# Patient Record
Sex: Female | Born: 1990 | Race: White | Hispanic: No | Marital: Married | Smoking: Never smoker
Health system: Southern US, Community
[De-identification: ages and names within clinical notes are randomized; demographics above are authoritative.]

## PROBLEM LIST (undated history)

## (undated) ENCOUNTER — Inpatient Hospital Stay: Payer: Self-pay

## (undated) DIAGNOSIS — R569 Unspecified convulsions: Secondary | ICD-10-CM

## (undated) DIAGNOSIS — N200 Calculus of kidney: Secondary | ICD-10-CM

## (undated) DIAGNOSIS — O26839 Pregnancy related renal disease, unspecified trimester: Secondary | ICD-10-CM

## (undated) DIAGNOSIS — Z87442 Personal history of urinary calculi: Secondary | ICD-10-CM

## (undated) DIAGNOSIS — Z3009 Encounter for other general counseling and advice on contraception: Secondary | ICD-10-CM

## (undated) HISTORY — DX: Encounter for other general counseling and advice on contraception: Z30.09

## (undated) HISTORY — DX: Personal history of urinary calculi: Z87.442

## (undated) HISTORY — PX: TONSILLECTOMY: SUR1361

---
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
## 2009-01-25 ENCOUNTER — Inpatient Hospital Stay: Payer: Self-pay | Admitting: Internal Medicine

## 2009-03-10 ENCOUNTER — Ambulatory Visit: Payer: Self-pay

## 2009-03-20 ENCOUNTER — Ambulatory Visit: Payer: Self-pay

## 2009-04-07 ENCOUNTER — Ambulatory Visit: Payer: Self-pay

## 2010-03-02 ENCOUNTER — Emergency Department (HOSPITAL_COMMUNITY)
Admission: EM | Admit: 2010-03-02 | Discharge: 2010-03-03 | Payer: Self-pay | Source: Home / Self Care | Admitting: Emergency Medicine

## 2010-03-03 LAB — CBC
HCT: 36.6 % (ref 36.0–46.0)
Hemoglobin: 12.5 g/dL (ref 12.0–15.0)
MCV: 83.4 fL (ref 78.0–100.0)
WBC: 11.6 10*3/uL — ABNORMAL HIGH (ref 4.0–10.5)

## 2010-03-03 LAB — URINE MICROSCOPIC-ADD ON

## 2010-03-03 LAB — URINALYSIS, ROUTINE W REFLEX MICROSCOPIC
Specific Gravity, Urine: 1.02 (ref 1.005–1.030)
Urine Glucose, Fasting: NEGATIVE mg/dL
pH: 6.5 (ref 5.0–8.0)

## 2010-03-03 LAB — BASIC METABOLIC PANEL
CO2: 23 mEq/L (ref 19–32)
Glucose, Bld: 92 mg/dL (ref 70–99)
Potassium: 4 mEq/L (ref 3.5–5.1)
Sodium: 140 mEq/L (ref 135–145)

## 2010-03-03 LAB — DIFFERENTIAL
Basophils Absolute: 0 10*3/uL (ref 0.0–0.1)
Lymphocytes Relative: 26 % (ref 12–46)
Lymphs Abs: 3.1 10*3/uL (ref 0.7–4.0)
Monocytes Absolute: 0.7 10*3/uL (ref 0.1–1.0)
Neutro Abs: 7.8 10*3/uL — ABNORMAL HIGH (ref 1.7–7.7)

## 2010-03-05 LAB — URINE CULTURE: Colony Count: >=100000

## 2010-12-27 ENCOUNTER — Other Ambulatory Visit: Payer: Self-pay | Admitting: Dermatology

## 2011-07-29 IMAGING — CT CT ABD-PELV W/O CM
3 of 4 series · 7 of 46 positions shown, 13 images · non-contrast
Comparison: None

CLINICAL DATA: Bilateral flank pain/back pain.

CT ABDOMEN AND PELVIS WITHOUT CONTRAST
TECHNIQUE: Multidetector CT imaging of the abdomen and pelvis was
performed following the standard protocol without intravenous
contrast.

[Series 3: lung 5.0 b60f · axial · 0.72mm/px · z∈[-46,-2]mm · 3 of 10 slices shown, 7 images]
[im 1/10  soft-tissue]
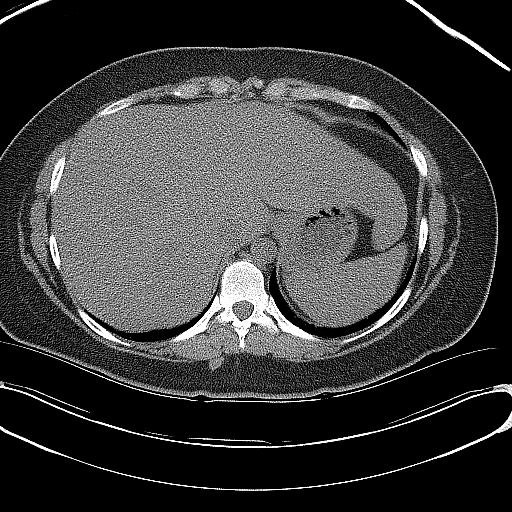
[im 1/10  lung]
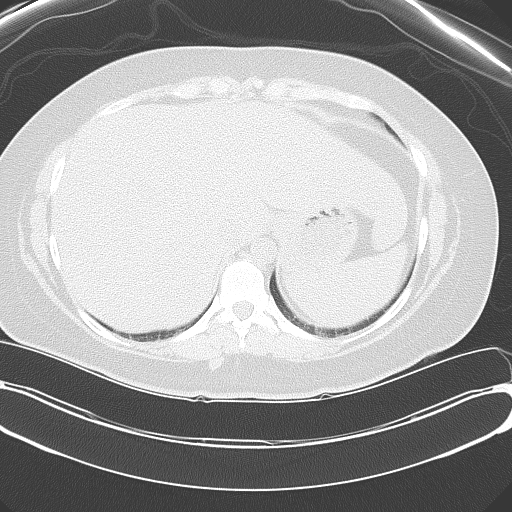
[im 1/10  bone]
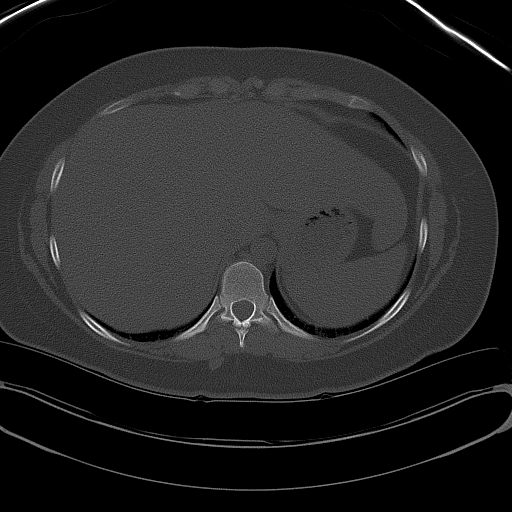
[im 5/10  soft-tissue]
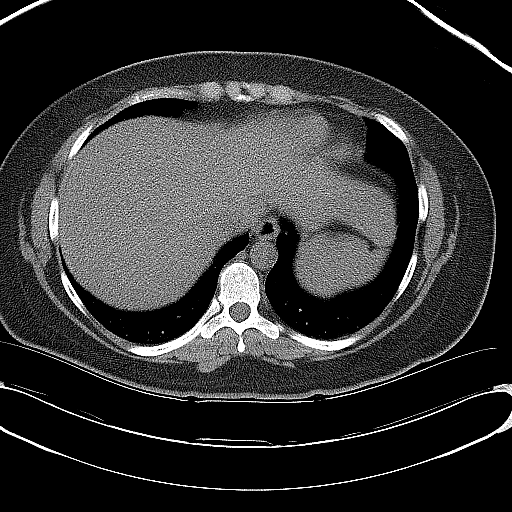
[im 5/10  lung]
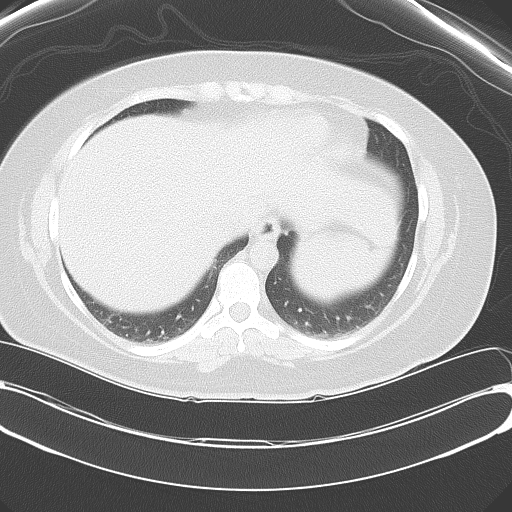
[im 10/10  soft-tissue]
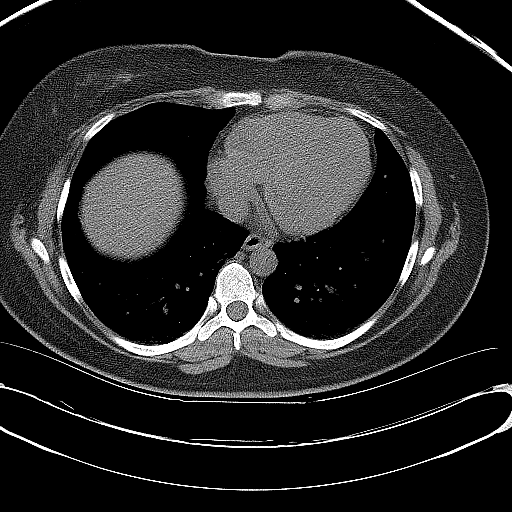
[im 10/10  lung]
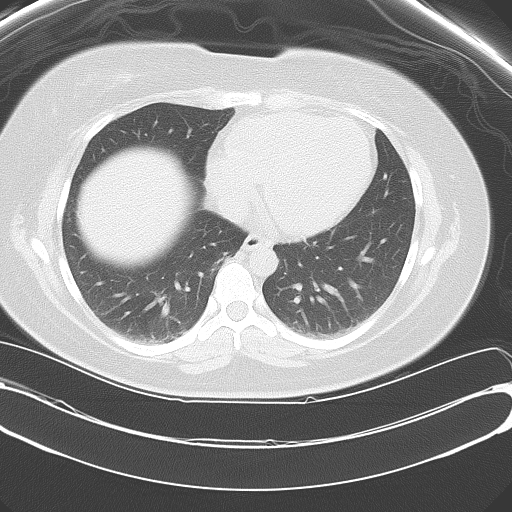

[Series 4: mpr coronal (id) · coronal · 0.74mm/px · 3 of 84 slices shown, 4 images]
[im 28/84  soft-tissue]
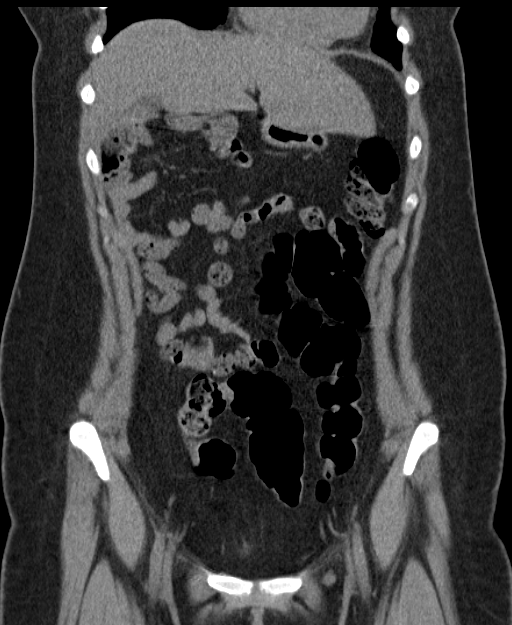
[im 37/84  soft-tissue]
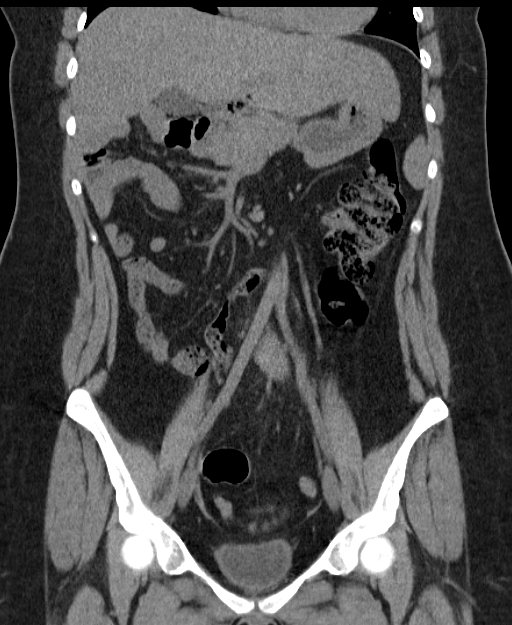
[im 37/84  bone]
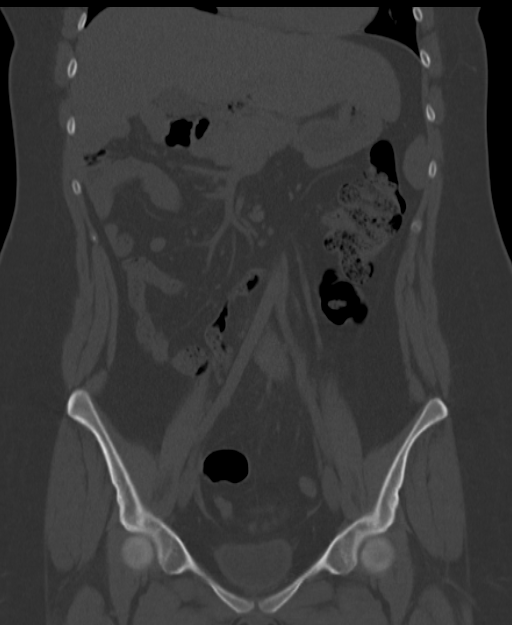
[im 47/84  soft-tissue]
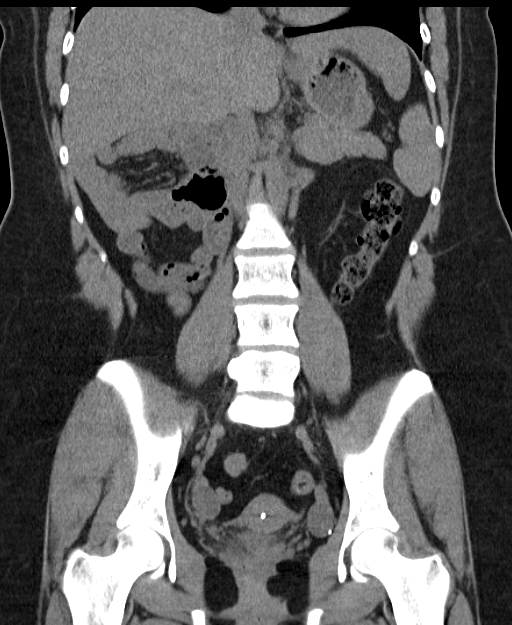

[Series 5: mpr sagittal (id) · sagittal · 0.54mm/px · 1 of 128 slices shown, 2 images]
[im 43/128  soft-tissue]
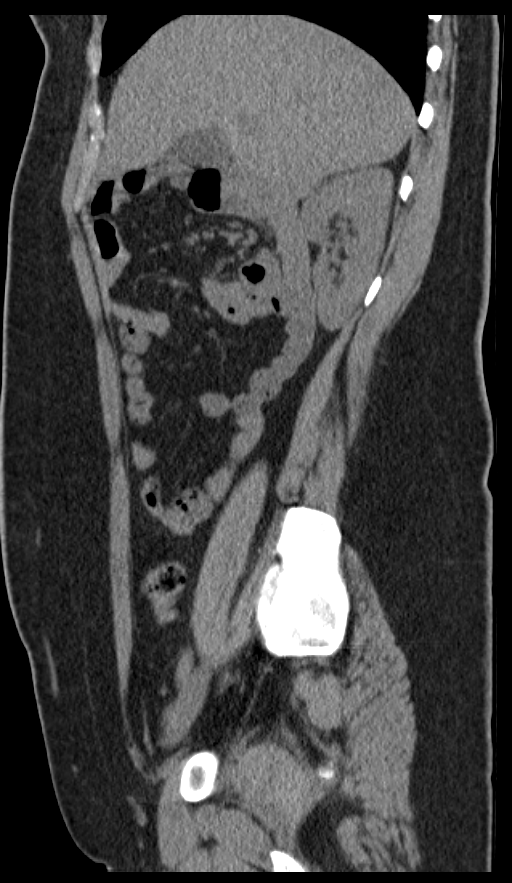
[im 43/128  bone]
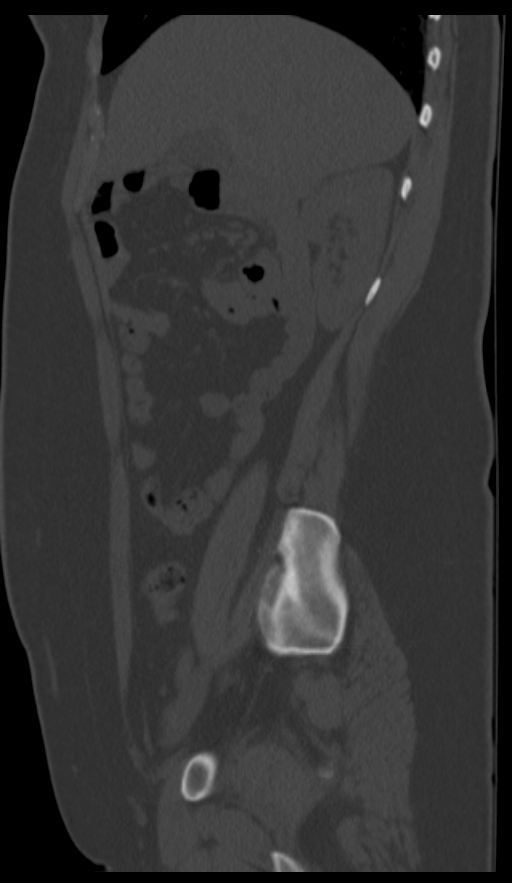

[7 of 46 positions shown; findings below may reference images not displayed]

FINDINGS: Minimal dependent atelectasis in the lung bases.  No
effusions.  Heart is normal size.  Small punctate nonobstructing
right renal calculi noted.  No stones on the left.  Ureters
decompressed.  No ureteral or bladder stones.  Bladder
decompressed.

Uterus and adnexa are unremarkable on this unenhanced study.  No
free fluid, free air or adenopathy.  Liver, spleen, pancreas,
adrenals have an unremarkable unenhanced appearance.

Incidentally noted is malrotation of the bowel.  Small bowel
predominately located in the right abdomen with the colon in the
left abdomen.  Appendix is midline and is normal.  No evidence of
obstruction.

No acute bony abnormality.
IMPRESSION: Right nephrolithiasis.  No ureteral stones or hydronephrosis.

Incidentally noted is malrotation of the bowel as described above.

No acute findings.

## 2011-10-06 LAB — OB RESULTS CONSOLE ABO/RH: RH Type: POSITIVE

## 2011-10-06 LAB — OB RESULTS CONSOLE RUBELLA ANTIBODY, IGM: Rubella: IMMUNE

## 2011-10-06 LAB — OB RESULTS CONSOLE VARICELLA ZOSTER ANTIBODY, IGG: Varicella: NON-IMMUNE/NOT IMMUNE

## 2011-10-06 LAB — OB RESULTS CONSOLE HIV ANTIBODY (ROUTINE TESTING): HIV: NONREACTIVE

## 2011-10-09 ENCOUNTER — Encounter (HOSPITAL_COMMUNITY): Payer: Self-pay | Admitting: Emergency Medicine

## 2011-10-09 ENCOUNTER — Emergency Department (HOSPITAL_COMMUNITY)
Admission: EM | Admit: 2011-10-09 | Discharge: 2011-10-09 | Disposition: A | Discharge status: Home / Self Care | Payer: Medicaid Other | Attending: Emergency Medicine | Admitting: Emergency Medicine

## 2011-10-09 DIAGNOSIS — O210 Mild hyperemesis gravidarum: Secondary | ICD-10-CM

## 2011-10-09 HISTORY — DX: Unspecified convulsions: R56.9

## 2011-10-09 LAB — CBC WITH DIFFERENTIAL/PLATELET
Basophils Absolute: 0 10*3/uL (ref 0.0–0.1)
Basophils Relative: 0 % (ref 0–1)
Hemoglobin: 13 g/dL (ref 12.0–15.0)
MCHC: 33.5 g/dL (ref 30.0–36.0)
Monocytes Relative: 5 % (ref 3–12)
Neutro Abs: 11.9 10*3/uL — ABNORMAL HIGH (ref 1.7–7.7)
Neutrophils Relative %: 91 % — ABNORMAL HIGH (ref 43–77)

## 2011-10-09 LAB — PREGNANCY, URINE: Preg Test, Ur: POSITIVE — AB

## 2011-10-09 LAB — URINALYSIS, ROUTINE W REFLEX MICROSCOPIC
Glucose, UA: NEGATIVE mg/dL
Leukocytes, UA: NEGATIVE
Protein, ur: NEGATIVE mg/dL
pH: 7 (ref 5.0–8.0)

## 2011-10-09 LAB — BASIC METABOLIC PANEL
Chloride: 100 mEq/L (ref 96–112)
GFR calc Af Amer: >90 mL/min (ref >90)
Potassium: 4 mEq/L (ref 3.5–5.1)

## 2011-10-09 MED ORDER — ONDANSETRON HCL 4 MG/2ML IJ SOLN
4.0000 mg | Freq: Once | INTRAMUSCULAR | Status: AC
  Administered 2011-10-09: 4 mg via INTRAVENOUS
  Filled 2011-10-09: qty 2

## 2011-10-09 MED ORDER — ONDANSETRON HCL 4 MG/2ML IJ SOLN
INTRAMUSCULAR | Status: AC
  Administered 2011-10-09: 4 mg
  Filled 2011-10-09: qty 2

## 2011-10-09 MED ORDER — ONDANSETRON 8 MG PO TBDP: 8.0000 mg | ORAL_TABLET | Freq: Three times a day (TID) | ORAL | Status: AC | PRN

## 2011-10-09 MED ORDER — SODIUM CHLORIDE 0.9 % IV BOLUS (SEPSIS)
1000.0000 mL | Freq: Once | INTRAVENOUS | Status: AC
  Administered 2011-10-09: 1000 mL via INTRAVENOUS

## 2011-10-09 MED ORDER — PROMETHAZINE HCL 25 MG PO TABS: 25.0000 mg | ORAL_TABLET | Freq: Four times a day (QID) | ORAL | Status: DC | PRN

## 2011-10-09 MED ORDER — SODIUM CHLORIDE 0.9 % IV SOLN
INTRAVENOUS | Status: DC
  Administered 2011-10-09: 11:00:00 via INTRAVENOUS

## 2011-10-09 NOTE — ED Notes (Signed)
Pt requesting water. edp aware. vo 2nd Liter bolus and repeat zofran 4mg  iv dr Deretha Emory. Read back and verified.

## 2011-10-09 NOTE — ED Provider Notes (Signed)
History   This chart was scribed for Shelda Jakes, MD by Sofie Rower. The patient was seen in room APA05/APA05 and the patient's care was started at 8:23AM.     CSN: [id number]  Arrival date & time 10/09/11  4098   First MD Initiated Contact with Patient 10/09/11 [phone]      Chief Complaint  Patient presents with  . Morning Sickness    (Consider location/radiation/quality/duration/timing/severity/associated sxs/prior treatment) The history is provided by the patient. No language interpreter was used.    Jade Palmer is a 21 y.o. female , with a hx of being [redacted] weeks pregnant, who presents to the Emergency Department complaining of sudden, progressively worsening, emesis onset yesterday (multiple episodes) with associated symptoms of nausea and generalized abdominal pain. The pt reports she is vomiting every 15-30 minutes, occuring every time she tries to eat or drink fluids. In addition, the pt informs her LNMP was June 18th, 2013. The pt is G:1, P: 0, and due to visit Dr. Delbert Harness, OB/GYN, on 10/17/11. The pt has a hx of seizures.   The pt denies allergies to medications, vaginal discharge, chest pain, sob, abd pain, weakness, numbness, neck pain, back pain, rash, and fever.    The pt does not smoke or drink alcohol. OB/GYN is Dr.Furgusson.    Past Medical History  Diagnosis Date  . Seizures     Past Surgical History  Procedure Date  . Tonsillectomy     No family history on file.  History  Substance Use Topics  . Smoking status: Never Smoker   . Smokeless tobacco: Not on file  . Alcohol Use: No    OB History    Grav Para Term Preterm Abortions TAB SAB Ect Mult Living   1               Review of Systems  All other systems reviewed and are negative.    Allergies  Review of patient's allergies indicates no known allergies.  Home Medications   Current Outpatient Rx  Name Route Sig Dispense Refill  . PRENATAL PLUS 27-1 MG PO TABS Oral Take 1 tablet by mouth  daily.      BP 124/87  Pulse 86  Temp 97.8 F (36.6 C)  Resp 16  Ht 5\' 7"  (1.702 m)  Wt 205 lb (92.987 kg)  BMI 32.11 kg/m2  SpO2 98%  LMP 07/23/2011  Physical Exam  Nursing note and vitals reviewed. Constitutional: She is oriented to person, place, and time. She appears well-developed and well-nourished.  HENT:  Head: Atraumatic.  Nose: Nose normal.       Mucus membranes dry.   Eyes: Conjunctivae and EOM are normal. Pupils are equal, round, and reactive to light.  Neck: Normal range of motion. Neck supple.  Cardiovascular: Normal rate, regular rhythm and normal heart sounds.   No murmur heard. Pulmonary/Chest: Effort normal and breath sounds normal.  Abdominal: Soft. Bowel sounds are normal. There is no tenderness.  Musculoskeletal: Normal range of motion. She exhibits no edema.  Lymphadenopathy:    She has no cervical adenopathy.  Neurological: She is alert and oriented to person, place, and time.  Skin: Skin is warm and dry.  Psychiatric: She has a normal mood and affect. Her behavior is normal.    ED Course  Procedures (including critical care time)  DIAGNOSTIC STUDIES: Oxygen Saturation is 98% on room air, normal by my interpretation.    COORDINATION OF CARE:    8:54AM- IV fluids, laboratory examinations,  and nausea management discussed. Pt agrees with treatment.   10:03AM- Recheck. Pt reports feeling better.   Results for orders placed during the hospital encounter of 10/09/11  URINALYSIS, ROUTINE W REFLEX MICROSCOPIC      Component Value Range   Color, Urine YELLOW  YELLOW   APPearance HAZY (*) CLEAR   Specific Gravity, Urine 1.020  1.005 - 1.030   pH 7.0  5.0 - 8.0   Glucose, UA NEGATIVE  NEGATIVE mg/dL   Hgb urine dipstick NEGATIVE  NEGATIVE   Bilirubin Urine NEGATIVE  NEGATIVE   Ketones, ur TRACE (*) NEGATIVE mg/dL   Protein, ur NEGATIVE  NEGATIVE mg/dL   Urobilinogen, UA 0.2  0.0 - 1.0 mg/dL   Nitrite NEGATIVE  NEGATIVE   Leukocytes, UA  NEGATIVE  NEGATIVE  PREGNANCY, URINE      Component Value Range   Preg Test, Ur POSITIVE (*) NEGATIVE  CBC WITH DIFFERENTIAL      Component Value Range   WBC 13.2 (*) 4.0 - 10.5 K/uL   RBC 4.67  3.87 - 5.11 MIL/uL   Hemoglobin 13.0  12.0 - 15.0 g/dL   HCT 53.6  64.4 - 03.4 %   MCV 83.1  78.0 - 100.0 fL   MCH 27.8  26.0 - 34.0 pg   MCHC 33.5  30.0 - 36.0 g/dL   RDW 74.2  59.5 - 63.8 %   Platelets 213  150 - 400 K/uL   Neutrophils Relative 91 (*) 43 - 77 %   Neutro Abs 11.9 (*) 1.7 - 7.7 K/uL   Lymphocytes Relative 5 (*) 12 - 46 %   Lymphs Abs 0.6 (*) 0.7 - 4.0 K/uL   Monocytes Relative 5  3 - 12 %   Monocytes Absolute 0.6  0.1 - 1.0 K/uL   Eosinophils Relative 0  0 - 5 %   Eosinophils Absolute 0.0  0.0 - 0.7 K/uL   Basophils Relative 0  0 - 1 %   Basophils Absolute 0.0  0.0 - 0.1 K/uL  BASIC METABOLIC PANEL      Component Value Range   Sodium 135  135 - 145 mEq/L   Potassium 4.0  3.5 - 5.1 mEq/L   Chloride 100  96 - 112 mEq/L   CO2 22  19 - 32 mEq/L   Glucose, Bld 120 (*) 70 - 99 mg/dL   BUN 9  6 - 23 mg/dL   Creatinine, Ser 7.56  0.50 - 1.10 mg/dL   Calcium 9.7  8.4 - 43.3 mg/dL   GFR calc non Af Amer >90  >90 mL/min   GFR calc Af Amer >90  >90 mL/min     No results found.   1. Morning sickness       MDM  Patient is approximately [redacted] weeks pregnant has followup with Dr. Emelda Fear on the ninth for her first OB appointment. Today's workup without any significant evidence of dehydration or metabolic acidosis urinalysis negative for urinary tract infection. Patient hydrated with 2 L normal saline in the emergency department and treated with Zofran x2 still occasionally having some vomiting but improved. Patient will be discharged home with Zofran and Phenergan to take at home. She will return for any newer worse symptoms. No significant abdominal pain or tenderness no vaginal bleeding.      I personally performed the services described in this documentation, which was  scribed in my presence. The recorded information has been reviewed and considered.     Shelda Jakes,  MD 10/09/11 1315

## 2011-10-09 NOTE — ED Notes (Signed)
Pt c/o n/v x 24 hours. Pt approximately 7-[redacted] weeks pregnant-has obgyn appt on 9/9.

## 2012-02-08 NOTE — L&D Delivery Note (Signed)
Delivery Note  Jade Palmer is a 22 y.o. G1P0101 admitted at 35w for preeclampsia evaluation. She developed severe features and was moved to L&D for induction of labor. After 3 days of induction (with cytotec and pitocin), she finally reached complete dilation and had a normal SVD.    At 5:44 AM on 05/04/12, a viable and healthy female was delivered via Vaginal, Spontaneous Delivery (Presentation: ; Occiput Anterior).  APGAR: 8, 9; weight 6 lb (2722 g).  Placenta status: Intact, Spontaneous.  Cord: 3 vessels with the following complications: None.  Cord pH: n/a  Anesthesia: Epidural  Episiotomy: None Lacerations: 1st degree;Perineal Suture Repair: 3.0 vicryl Est. Blood Loss (mL): 300  Mom to AICU for 24hr Mag due to preX.  Baby to nursery-stable/with mom.  Briefly evaluated by NICU, stable to stay in room.  MCGILL,JACQUELYN 05/04/2012, 6:46 AM  I was present for entire delivery and agree with above resident note. Patient to be transferred to ICU for 24 hours of MgSO4 postpartum. BPs have been controlled (normal to mild range) over the past 24 hours. Napoleon Form, MD

## 2012-04-03 ENCOUNTER — Encounter: Payer: Self-pay | Admitting: *Deleted

## 2012-04-24 ENCOUNTER — Other Ambulatory Visit: Payer: Self-pay | Admitting: Obstetrics & Gynecology

## 2012-04-24 ENCOUNTER — Ambulatory Visit (INDEPENDENT_AMBULATORY_CARE_PROVIDER_SITE_OTHER): Payer: Medicaid Other | Admitting: Obstetrics & Gynecology

## 2012-04-24 VITALS — BP 142/90 | Temp 98.4°F | Wt 230.0 lb

## 2012-04-24 DIAGNOSIS — IMO0002 Reserved for concepts with insufficient information to code with codable children: Secondary | ICD-10-CM

## 2012-04-24 DIAGNOSIS — Z3490 Encounter for supervision of normal pregnancy, unspecified, unspecified trimester: Secondary | ICD-10-CM

## 2012-04-24 DIAGNOSIS — O360990 Maternal care for other rhesus isoimmunization, unspecified trimester, not applicable or unspecified: Secondary | ICD-10-CM

## 2012-04-24 DIAGNOSIS — O139 Gestational [pregnancy-induced] hypertension without significant proteinuria, unspecified trimester: Secondary | ICD-10-CM

## 2012-04-24 DIAGNOSIS — O163 Unspecified maternal hypertension, third trimester: Secondary | ICD-10-CM

## 2012-04-24 DIAGNOSIS — Z1389 Encounter for screening for other disorder: Secondary | ICD-10-CM

## 2012-04-24 DIAGNOSIS — O9989 Other specified diseases and conditions complicating pregnancy, childbirth and the puerperium: Secondary | ICD-10-CM

## 2012-04-24 LAB — POCT URINALYSIS DIPSTICK: Glucose, UA: NEGATIVE

## 2012-04-24 LAB — CBC
Hemoglobin: 9.5 g/dL — ABNORMAL LOW (ref 12.0–15.0)
MCHC: 32.9 g/dL (ref 30.0–36.0)
Platelets: 167 10*3/uL (ref 150–400)
RBC: 3.37 MIL/uL — ABNORMAL LOW (ref 3.87–5.11)

## 2012-04-24 NOTE — Progress Notes (Signed)
Pt fell today before appt. C/o lower back pain and a lot swelling hands, feet, and face.  Also questionable sinus infection and HA.

## 2012-04-24 NOTE — Patient Instructions (Signed)

## 2012-04-24 NOTE — Progress Notes (Signed)
Repeat BP a bit better. Patient's blood pressure is significantly elevated over her baseline.  Labs drawn today and will start NST and/or twice weekly fetal surveillance on Friday.  Patient aware of the concern and the need for closer follow up.

## 2012-04-24 NOTE — Progress Notes (Signed)
The time of face to face interaction with the patient was greater than 25 minutes.

## 2012-04-25 LAB — COMPREHENSIVE METABOLIC PANEL
ALT: 8 U/L (ref 0–35)
Albumin: 3.5 g/dL (ref 3.5–5.2)
Alkaline Phosphatase: 99 U/L (ref 39–117)
CO2: 20 mEq/L (ref 19–32)
Glucose, Bld: 99 mg/dL (ref 70–99)
Potassium: 4 mEq/L (ref 3.5–5.3)
Sodium: 139 mEq/L (ref 135–145)
Total Protein: 6 g/dL (ref 6.0–8.3)

## 2012-04-25 LAB — PROTEIN / CREATININE RATIO, URINE
Protein Creatinine Ratio: 0.17 — ABNORMAL HIGH (ref <0.15)
Total Protein, Urine: 41 mg/dL

## 2012-04-27 ENCOUNTER — Ambulatory Visit: Payer: Medicaid Other | Admitting: Obstetrics & Gynecology

## 2012-04-27 ENCOUNTER — Ambulatory Visit (INDEPENDENT_AMBULATORY_CARE_PROVIDER_SITE_OTHER): Payer: Medicaid Other | Admitting: Obstetrics & Gynecology

## 2012-04-27 VITALS — BP 158/98 | Wt 229.0 lb

## 2012-04-27 DIAGNOSIS — IMO0002 Reserved for concepts with insufficient information to code with codable children: Secondary | ICD-10-CM

## 2012-04-27 DIAGNOSIS — O139 Gestational [pregnancy-induced] hypertension without significant proteinuria, unspecified trimester: Secondary | ICD-10-CM

## 2012-04-27 DIAGNOSIS — Z331 Pregnant state, incidental: Secondary | ICD-10-CM

## 2012-04-27 DIAGNOSIS — Z3490 Encounter for supervision of normal pregnancy, unspecified, unspecified trimester: Secondary | ICD-10-CM

## 2012-04-27 DIAGNOSIS — Z1389 Encounter for screening for other disorder: Secondary | ICD-10-CM

## 2012-04-27 LAB — POCT URINALYSIS DIPSTICK: Protein, UA: NEGATIVE

## 2012-04-27 NOTE — Progress Notes (Signed)
Pt states "feeling baby move but not as much." rechecked pt B/P 158/98.

## 2012-04-27 NOTE — Patient Instructions (Signed)

## 2012-04-27 NOTE — Progress Notes (Signed)
Jade Palmer is in for visit?NST.  NST reactive no decels.  See report.  BP still elevated in the mild range although trending higher.  Labs on 3/18 were all normal with PR/Cr ratio .17, up from .07 previously.  I am ordering a sonogram for Tuesday and visit.  Patient aware induction in the next week or so is possible.  Taken out of work.  Diminish activities. Face to face time 25 minutes.

## 2012-04-29 NOTE — Progress Notes (Signed)
Patient ID: Jade Palmer, female   DOB: [birth year], 22 y.o.   MRN: [id number] Please see the "OB" note for today's visit.  The results of the NST and visit are in her other endcounter EURE,LUTHER H 04/29/2012 10:10 PM

## 2012-04-30 ENCOUNTER — Ambulatory Visit (INDEPENDENT_AMBULATORY_CARE_PROVIDER_SITE_OTHER): Payer: Medicaid Other

## 2012-04-30 ENCOUNTER — Ambulatory Visit (INDEPENDENT_AMBULATORY_CARE_PROVIDER_SITE_OTHER): Payer: Medicaid Other | Admitting: Obstetrics & Gynecology

## 2012-04-30 ENCOUNTER — Encounter (HOSPITAL_COMMUNITY): Payer: Self-pay | Admitting: *Deleted

## 2012-04-30 ENCOUNTER — Other Ambulatory Visit: Payer: Self-pay | Admitting: Obstetrics & Gynecology

## 2012-04-30 ENCOUNTER — Inpatient Hospital Stay (HOSPITAL_COMMUNITY)
Admission: AD | Admit: 2012-04-30 | Discharge: 2012-05-06 | DRG: 774 | Disposition: A | Discharge status: Home / Self Care | Payer: Medicaid Other | Source: Ambulatory Visit | Attending: Obstetrics & Gynecology | Admitting: Obstetrics & Gynecology

## 2012-04-30 VITALS — BP 168/105 | Wt 229.0 lb

## 2012-04-30 DIAGNOSIS — Z331 Pregnant state, incidental: Secondary | ICD-10-CM

## 2012-04-30 DIAGNOSIS — O360990 Maternal care for other rhesus isoimmunization, unspecified trimester, not applicable or unspecified: Secondary | ICD-10-CM

## 2012-04-30 DIAGNOSIS — IMO0002 Reserved for concepts with insufficient information to code with codable children: Secondary | ICD-10-CM

## 2012-04-30 DIAGNOSIS — Z1389 Encounter for screening for other disorder: Secondary | ICD-10-CM

## 2012-04-30 DIAGNOSIS — O99019 Anemia complicating pregnancy, unspecified trimester: Secondary | ICD-10-CM

## 2012-04-30 DIAGNOSIS — G40909 Epilepsy, unspecified, not intractable, without status epilepticus: Secondary | ICD-10-CM | POA: Diagnosis present

## 2012-04-30 DIAGNOSIS — O169 Unspecified maternal hypertension, unspecified trimester: Secondary | ICD-10-CM

## 2012-04-30 DIAGNOSIS — I679 Cerebrovascular disease, unspecified: Secondary | ICD-10-CM

## 2012-04-30 DIAGNOSIS — O99419 Diseases of the circulatory system complicating pregnancy, unspecified trimester: Secondary | ICD-10-CM

## 2012-04-30 DIAGNOSIS — Z3490 Encounter for supervision of normal pregnancy, unspecified, unspecified trimester: Secondary | ICD-10-CM

## 2012-04-30 DIAGNOSIS — O99354 Diseases of the nervous system complicating childbirth: Secondary | ICD-10-CM | POA: Diagnosis present

## 2012-04-30 LAB — URINE MICROSCOPIC-ADD ON

## 2012-04-30 LAB — CBC
HCT: 30 % — ABNORMAL LOW (ref 36.0–46.0)
Hemoglobin: 9.9 g/dL — ABNORMAL LOW (ref 12.0–15.0)
MCV: 86.7 fL (ref 78.0–100.0)
Platelets: 143 10*3/uL — ABNORMAL LOW (ref 150–400)
RBC: 3.46 MIL/uL — ABNORMAL LOW (ref 3.87–5.11)
WBC: 10.1 10*3/uL (ref 4.0–10.5)

## 2012-04-30 LAB — POCT URINALYSIS DIPSTICK
Glucose, UA: NEGATIVE
Ketones, UA: NEGATIVE

## 2012-04-30 LAB — URINALYSIS, ROUTINE W REFLEX MICROSCOPIC
Glucose, UA: NEGATIVE mg/dL
Hgb urine dipstick: NEGATIVE
Specific Gravity, Urine: 1.01 (ref 1.005–1.030)

## 2012-04-30 LAB — COMPREHENSIVE METABOLIC PANEL
AST: 12 U/L (ref 0–37)
Albumin: 2.5 g/dL — ABNORMAL LOW (ref 3.5–5.2)
Alkaline Phosphatase: 108 U/L (ref 39–117)
Chloride: 101 mEq/L (ref 96–112)
Potassium: 3.9 mEq/L (ref 3.5–5.1)
Total Bilirubin: 0.2 mg/dL — ABNORMAL LOW (ref 0.3–1.2)

## 2012-04-30 LAB — US OB FOLLOW UP

## 2012-04-30 LAB — RAPID URINE DRUG SCREEN, HOSP PERFORMED
Amphetamines: NOT DETECTED
Opiates: NOT DETECTED

## 2012-04-30 MED ORDER — LABETALOL HCL 100 MG PO TABS
100.0000 mg | ORAL_TABLET | Freq: Two times a day (BID) | ORAL | Status: DC
  Administered 2012-04-30 – 2012-05-01 (×2): 100 mg via ORAL
  Filled 2012-04-30 (×7): qty 1

## 2012-04-30 MED ORDER — ACETAMINOPHEN 325 MG PO TABS
650.0000 mg | ORAL_TABLET | ORAL | Status: DC | PRN
  Administered 2012-04-30 – 2012-05-01 (×2): 650 mg via ORAL
  Filled 2012-04-30 (×2): qty 2

## 2012-04-30 MED ORDER — CALCIUM CARBONATE ANTACID 500 MG PO CHEW: 2.0000 | CHEWABLE_TABLET | ORAL | Status: DC | PRN

## 2012-04-30 MED ORDER — LABETALOL HCL 5 MG/ML IV SOLN: 20.0000 mg | Freq: Once | INTRAVENOUS | Status: DC

## 2012-04-30 MED ORDER — MAGNESIUM SULFATE 40 G IN LACTATED RINGERS - SIMPLE
2.0000 g/h | INTRAVENOUS | Status: DC
  Filled 2012-04-30: qty 500

## 2012-04-30 MED ORDER — MAGNESIUM SULFATE 40 G IN LACTATED RINGERS - SIMPLE
2.0000 g/h | INTRAVENOUS | Status: DC
  Administered 2012-05-01 – 2012-05-03 (×4): 2 g/h via INTRAVENOUS
  Filled 2012-04-30 (×4): qty 500

## 2012-04-30 MED ORDER — ZOLPIDEM TARTRATE 5 MG PO TABS: 5.0000 mg | ORAL_TABLET | Freq: Every evening | ORAL | Status: DC | PRN

## 2012-04-30 MED ORDER — PRENATAL MULTIVITAMIN CH
1.0000 | ORAL_TABLET | Freq: Every day | ORAL | Status: DC
  Administered 2012-05-01: 1 via ORAL
  Filled 2012-04-30: qty 1

## 2012-04-30 MED ORDER — LABETALOL HCL 5 MG/ML IV SOLN
10.0000 mg | Freq: Once | INTRAVENOUS | Status: AC
  Administered 2012-04-30: 10 mg via INTRAVENOUS
  Filled 2012-04-30: qty 4

## 2012-04-30 MED ORDER — MAGNESIUM SULFATE BOLUS VIA INFUSION
4.0000 g | Freq: Once | INTRAVENOUS | Status: DC
  Filled 2012-04-30: qty 500

## 2012-04-30 MED ORDER — DOCUSATE SODIUM 100 MG PO CAPS
100.0000 mg | ORAL_CAPSULE | Freq: Every day | ORAL | Status: DC
  Administered 2012-05-01: 100 mg via ORAL
  Filled 2012-04-30: qty 1

## 2012-04-30 MED ORDER — MAGNESIUM SULFATE BOLUS VIA INFUSION
4.0000 g | Freq: Once | INTRAVENOUS | Status: AC
  Administered 2012-05-01: 4 g via INTRAVENOUS
  Filled 2012-04-30: qty 500

## 2012-04-30 MED ORDER — LACTATED RINGERS IV SOLN
INTRAVENOUS | Status: DC
  Administered 2012-04-30 – 2012-05-01 (×2): via INTRAVENOUS

## 2012-04-30 NOTE — H&P (Signed)
Jade Palmer is a 22 y.o. female presenting for evaluation for preeclampsia evaluation.  BP was elevated to 170s/100s.  Pt has had a mild headache for several weeks.  No RUQ pain, no scotomata.  Swelling has improved on bedrest.  Pt's BP on admission is 144/81.   Maternal Medical History:  Reason for admission: Nausea.    OB History   Grav Para Term Preterm Abortions TAB SAB Ect Mult Living   1              Past Medical History  Diagnosis Date  . Seizures    Past Surgical History  Procedure Laterality Date  . Tonsillectomy     Family History: family history includes Diabetes in her other; Hypercholesterolemia in her other; and Hypertension in her other. Social History:  reports that she has never smoked. She does not have any smokeless tobacco history on file. She reports that she does not drink alcohol or use illicit drugs.   Prenatal Transfer Tool  Maternal Diabetes: No Genetic Screening: Normal Maternal Ultrasounds/Referrals: Normal Fetal Ultrasounds or other Referrals:  None Maternal Substance Abuse:  No Significant Maternal Medications:  None Significant Maternal Lab Results:  None Other Comments:  gestational hypertension  Review of Systems  Respiratory: Negative for cough.   Cardiovascular: Negative for chest pain.  Gastrointestinal: Negative for nausea, vomiting and abdominal pain.  Neurological: Positive for headaches.      Blood pressure 144/81, pulse 114, resp. rate 20, last menstrual period 07/23/2011. Exam Physical Exam  Vitals reviewed. Constitutional: She is oriented to person, place, and time. She appears well-developed and well-nourished.  HENT:  Head: Normocephalic and atraumatic.  Eyes: Conjunctivae are normal.  Neck: Neck supple.  Cardiovascular: Normal rate and regular rhythm.   Respiratory: Breath sounds normal. No respiratory distress. She has no wheezes. She has no rales.  GI: Soft. There is no tenderness. There is no rebound and no  guarding.  Neurological: She is alert and oriented to person, place, and time. She displays abnormal reflex.  Skin: Skin is warm and dry.  Psychiatric: She has a normal mood and affect.    Prenatal labs: ABO, Rh: O/Positive/-- (08/29 0000) Antibody: Negative (08/29 0000) Rubella: Immune (08/29 0000) RPR: Nonreactive (02/03 0000)  HBsAg:    HIV: Non-reactive (02/03 0000)  GBS:   unknown  Assessment/Plan: 22 yo G1P0 at 35 weeks   Serial BPs GBS test Fetal NSTs 24 hr urine Labs SCDs  Jade Jupiter H. 04/30/2012, 2:34 PM

## 2012-04-30 NOTE — Progress Notes (Signed)
Called [facility], spoke with Dr. Despina Hidden regarding patients hepatitis status, stated that records show NEGATIVE.  H.Salvador Bigbee RNC

## 2012-04-30 NOTE — Progress Notes (Signed)
Patient presents for accelerated follow up today because of her elevated BP over the past couple of weeks, now worsening now with possible associated CNS symptoms, present since yesterday.  She now has bitemporal headache with occasional visual disturbance. No focal neurological symptoms.  Labs the last 2 weeks have been normal and her Pr/Cr ratio was .17 last week.  Sonogram today was normal, see report under imaging results, with normal dopplers and BPP 8/8.  Patient understands she needs in house evaluation of her clinical status, BP and assessment of her progression, labs.  Icontacetd Dr Penne Lash who agrees with admission for evaluation of the severity of her current status and knows cervical ripening nd induction is possible, in fact probable given current findings. Encounter was 25 minutes with all being face to face.

## 2012-04-30 NOTE — Progress Notes (Signed)
U/S(35+2wks)-vtx, active fetus, BPP=8/8, fluid wnl AFI=11.96cm, post/fundal gr 3 plac, EFW 5 lb 4 oz (2368 gms) 26th%tile, UA Doppler RI=.64 &.62, female fetus ("Jade Palmer")

## 2012-04-30 NOTE — Progress Notes (Signed)
Pt co headaches

## 2012-04-30 NOTE — Patient Instructions (Signed)

## 2012-04-30 NOTE — OB Triage Provider Note (Signed)
S:  Pt is a G1P0 at 35.2 wks IUP; notified by RN that pt reports increased headache/pressure.  No report of epigastric pain.   OCeasar Mons Vitals:   04/30/12 1802 04/30/12 2031 04/30/12 2034 04/30/12 2307  BP: 140/89 157/92 178/98 159/105  Pulse: 99 102 108 107  Temp:  97.9 F (36.6 C)    TempSrc:  Oral    Resp: 20 20    Height:      Weight:       BP 159/105  Pulse 107  Temp(Src) 97.9 F (36.6 C) (Oral)  Resp 20  Ht 5' 6.5" (1.689 m)  Wt 103.42 kg (228 lb)  BMI 36.25 kg/m2  LMP 07/23/2011 General appearance: alert, cooperative and appears stated age Lungs: clear to auscultation bilaterally Heart: regular rate and rhythm, S1, S2 normal, no murmur, click, rub or gallop Extremities: edema 2+ bilat pedal edema; brisk reflexes  Results for orders placed during the hospital encounter of 04/30/12 (from the past 24 hour(s))  PROTEIN / CREATININE RATIO, URINE     Status: None   Collection Time    04/30/12  1:15 PM      Result Value Range   Creatinine, Urine 56.13     Total Protein, Urine 8.6     PROTEIN CREATININE RATIO 0.15  0.00 - 0.15  URINALYSIS, ROUTINE W REFLEX MICROSCOPIC     Status: Abnormal   Collection Time    04/30/12  1:15 PM      Result Value Range   Color, Urine YELLOW  YELLOW   APPearance HAZY (*) CLEAR   Specific Gravity, Urine 1.010  1.005 - 1.030   pH 7.0  5.0 - 8.0   Glucose, UA NEGATIVE  NEGATIVE mg/dL   Hgb urine dipstick NEGATIVE  NEGATIVE   Bilirubin Urine NEGATIVE  NEGATIVE   Ketones, ur NEGATIVE  NEGATIVE mg/dL   Protein, ur NEGATIVE  NEGATIVE mg/dL   Urobilinogen, UA 0.2  0.0 - 1.0 mg/dL   Nitrite NEGATIVE  NEGATIVE   Leukocytes, UA TRACE (*) NEGATIVE  URINE MICROSCOPIC-ADD ON     Status: Abnormal   Collection Time    04/30/12  1:15 PM      Result Value Range   Squamous Epithelial / LPF MANY (*) RARE   WBC, UA 0-2  <3 WBC/hpf   RBC / HPF 0-2  <3 RBC/hpf   Bacteria, UA RARE  RARE   Urine-Other YEAST    URINE RAPID DRUG SCREEN (HOSP PERFORMED)      Status: None   Collection Time    04/30/12  1:15 PM      Result Value Range   Opiates NONE DETECTED  NONE DETECTED   Cocaine NONE DETECTED  NONE DETECTED   Benzodiazepines NONE DETECTED  NONE DETECTED   Amphetamines NONE DETECTED  NONE DETECTED   Tetrahydrocannabinol NONE DETECTED  NONE DETECTED   Barbiturates NONE DETECTED  NONE DETECTED  CBC     Status: Abnormal   Collection Time    04/30/12  1:40 PM      Result Value Range   WBC 10.1  4.0 - 10.5 K/uL   RBC 3.46 (*) 3.87 - 5.11 MIL/uL   Hemoglobin 9.9 (*) 12.0 - 15.0 g/dL   HCT 16.1 (*) 09.6 - 04.5 %   MCV 86.7  78.0 - 100.0 fL   MCH 28.6  26.0 - 34.0 pg   MCHC 33.0  30.0 - 36.0 g/dL   RDW 40.9  81.1 - 91.4 %  Platelets 143 (*) 150 - 400 K/uL  COMPREHENSIVE METABOLIC PANEL     Status: Abnormal   Collection Time    04/30/12  1:40 PM      Result Value Range   Sodium 134 (*) 135 - 145 mEq/L   Potassium 3.9  3.5 - 5.1 mEq/L   Chloride 101  96 - 112 mEq/L   CO2 18 (*) 19 - 32 mEq/L   Glucose, Bld 121 (*) 70 - 99 mg/dL   BUN 7  6 - 23 mg/dL   Creatinine, Ser 1.61  0.50 - 1.10 mg/dL   Calcium 9.7  8.4 - 09.6 mg/dL   Total Protein 6.3  6.0 - 8.3 g/dL   Albumin 2.5 (*) 3.5 - 5.2 g/dL   AST 12  0 - 37 U/L   ALT 7  0 - 35 U/L   Alkaline Phosphatase 108  39 - 117 U/L   Total Bilirubin 0.2 (*) 0.3 - 1.2 mg/dL   GFR calc non Af Amer >90  >90 mL/min   GFR calc Af Amer >90  >90 mL/min  TYPE AND SCREEN     Status: None   Collection Time    04/30/12  1:40 PM      Result Value Range   ABO/RH(D) O NEG     Antibody Screen POS     Sample Expiration 05/03/2012     Antibody Identification PASSIVELY ACQUIRED ANTI-D     DAT, 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 Penne Lash > give IV labetalol, begin PO labetalol 200 mg BID; start Magnesium Sulfate while 24 hr pending Reassess prn Explained plan of care to patient; questions answered. [facility]

## 2012-04-30 NOTE — Progress Notes (Signed)
Pt taken off the monitor after reassurring FHR  

## 2012-05-01 DIAGNOSIS — IMO0002 Reserved for concepts with insufficient information to code with codable children: Secondary | ICD-10-CM

## 2012-05-01 LAB — CBC
HCT: 26.8 % — ABNORMAL LOW (ref 36.0–46.0)
MCH: 28.8 pg (ref 26.0–34.0)
MCV: 87.6 fL (ref 78.0–100.0)
Platelets: 151 10*3/uL (ref 150–400)
RBC: 3.06 MIL/uL — ABNORMAL LOW (ref 3.87–5.11)

## 2012-05-01 LAB — COMPREHENSIVE METABOLIC PANEL
BUN: 7 mg/dL (ref 6–23)
CO2: 20 mEq/L (ref 19–32)
Calcium: 8.4 mg/dL (ref 8.4–10.5)
Creatinine, Ser: 0.53 mg/dL (ref 0.50–1.10)
GFR calc Af Amer: >90 mL/min (ref >90)
GFR calc non Af Amer: >90 mL/min (ref >90)
Glucose, Bld: 87 mg/dL (ref 70–99)
Total Bilirubin: 0.2 mg/dL — ABNORMAL LOW (ref 0.3–1.2)

## 2012-05-01 LAB — MAGNESIUM: Magnesium: 4.3 mg/dL — ABNORMAL HIGH (ref 1.5–2.5)

## 2012-05-01 MED ORDER — DIPHENHYDRAMINE HCL 50 MG/ML IJ SOLN: 12.5000 mg | INTRAMUSCULAR | Status: DC | PRN

## 2012-05-01 MED ORDER — OXYCODONE-ACETAMINOPHEN 5-325 MG PO TABS
1.0000 | ORAL_TABLET | Freq: Once | ORAL | Status: AC
  Administered 2012-05-01: 1 via ORAL
  Filled 2012-05-01: qty 1
  Filled 2012-05-01: qty 2

## 2012-05-01 MED ORDER — FENTANYL CITRATE 0.05 MG/ML IJ SOLN
100.0000 ug | INTRAMUSCULAR | Status: DC | PRN
  Administered 2012-05-02: 100 ug via INTRAVENOUS
  Filled 2012-05-01 (×2): qty 2

## 2012-05-01 MED ORDER — CITRIC ACID-SODIUM CITRATE 334-500 MG/5ML PO SOLN: 30.0000 mL | ORAL | Status: DC | PRN

## 2012-05-01 MED ORDER — FLEET ENEMA 7-19 GM/118ML RE ENEM: 1.0000 | ENEMA | RECTAL | Status: DC | PRN

## 2012-05-01 MED ORDER — EPHEDRINE 5 MG/ML INJ
10.0000 mg | INTRAVENOUS | Status: DC | PRN
  Filled 2012-05-01: qty 2

## 2012-05-01 MED ORDER — LACTATED RINGERS IV SOLN: 500.0000 mL | Freq: Once | INTRAVENOUS | Status: DC

## 2012-05-01 MED ORDER — EPHEDRINE 5 MG/ML INJ
10.0000 mg | INTRAVENOUS | Status: DC | PRN
  Filled 2012-05-01: qty 2
  Filled 2012-05-01: qty 4

## 2012-05-01 MED ORDER — ONDANSETRON HCL 4 MG/2ML IJ SOLN
4.0000 mg | Freq: Four times a day (QID) | INTRAMUSCULAR | Status: DC | PRN
  Administered 2012-05-01 – 2012-05-03 (×2): 4 mg via INTRAVENOUS
  Filled 2012-05-01 (×2): qty 2

## 2012-05-01 MED ORDER — TERBUTALINE SULFATE 1 MG/ML IJ SOLN: 0.2500 mg | Freq: Once | INTRAMUSCULAR | Status: AC | PRN

## 2012-05-01 MED ORDER — OXYTOCIN BOLUS FROM INFUSION: 500.0000 mL | INTRAVENOUS | Status: DC

## 2012-05-01 MED ORDER — PENICILLIN G POTASSIUM 5000000 UNITS IJ SOLR
5.0000 10*6.[IU] | Freq: Once | INTRAVENOUS | Status: AC
  Administered 2012-05-01: 5 10*6.[IU] via INTRAVENOUS
  Filled 2012-05-01: qty 5

## 2012-05-01 MED ORDER — LACTATED RINGERS IV SOLN: 500.0000 mL | INTRAVENOUS | Status: DC | PRN

## 2012-05-01 MED ORDER — FENTANYL CITRATE 0.05 MG/ML IJ SOLN
INTRAMUSCULAR | Status: AC
  Administered 2012-05-01: 100 ug via INTRAVENOUS
  Filled 2012-05-01: qty 2

## 2012-05-01 MED ORDER — OXYTOCIN 40 UNITS IN LACTATED RINGERS INFUSION - SIMPLE MED
1.0000 m[IU]/min | INTRAVENOUS | Status: DC
  Administered 2012-05-01: 2 m[IU]/min via INTRAVENOUS
  Filled 2012-05-01 (×2): qty 1000

## 2012-05-01 MED ORDER — LIDOCAINE HCL (PF) 1 % IJ SOLN
30.0000 mL | INTRAMUSCULAR | Status: DC | PRN
  Filled 2012-05-01 (×2): qty 30

## 2012-05-01 MED ORDER — IBUPROFEN 600 MG PO TABS
600.0000 mg | ORAL_TABLET | Freq: Four times a day (QID) | ORAL | Status: DC | PRN
  Administered 2012-05-04: 600 mg via ORAL
  Filled 2012-05-01: qty 1

## 2012-05-01 MED ORDER — PHENYLEPHRINE 40 MCG/ML (10ML) SYRINGE FOR IV PUSH (FOR BLOOD PRESSURE SUPPORT)
80.0000 ug | PREFILLED_SYRINGE | INTRAVENOUS | Status: DC | PRN
  Filled 2012-05-01: qty 5
  Filled 2012-05-01: qty 2

## 2012-05-01 MED ORDER — OXYTOCIN 40 UNITS IN LACTATED RINGERS INFUSION - SIMPLE MED
62.5000 mL/h | INTRAVENOUS | Status: DC
  Administered 2012-05-04: 62.5 mL/h via INTRAVENOUS

## 2012-05-01 MED ORDER — PENICILLIN G POTASSIUM 5000000 UNITS IJ SOLR
2.5000 10*6.[IU] | INTRAVENOUS | Status: DC
  Administered 2012-05-01 – 2012-05-04 (×15): 2.5 10*6.[IU] via INTRAVENOUS
  Filled 2012-05-01 (×18): qty 2.5

## 2012-05-01 MED ORDER — OXYCODONE-ACETAMINOPHEN 5-325 MG PO TABS
1.0000 | ORAL_TABLET | ORAL | Status: DC | PRN
  Administered 2012-05-04: 1 via ORAL
  Filled 2012-05-01: qty 1

## 2012-05-01 MED ORDER — OXYCODONE-ACETAMINOPHEN 5-325 MG PO TABS: 1.0000 | ORAL_TABLET | Freq: Four times a day (QID) | ORAL | Status: DC | PRN

## 2012-05-01 MED ORDER — LACTATED RINGERS IV SOLN
INTRAVENOUS | Status: DC
  Administered 2012-05-01 – 2012-05-02 (×2): via INTRAVENOUS
  Administered 2012-05-03: 125 mL/h via INTRAVENOUS
  Administered 2012-05-03: 12:00:00 via INTRAVENOUS

## 2012-05-01 MED ORDER — FENTANYL 2.5 MCG/ML BUPIVACAINE 1/10 % EPIDURAL INFUSION (WH - ANES)
14.0000 mL/h | INTRAMUSCULAR | Status: DC | PRN
  Administered 2012-05-04: 14 mL/h via EPIDURAL
  Filled 2012-05-01 (×2): qty 125

## 2012-05-01 MED ORDER — PHENYLEPHRINE 40 MCG/ML (10ML) SYRINGE FOR IV PUSH (FOR BLOOD PRESSURE SUPPORT)
80.0000 ug | PREFILLED_SYRINGE | INTRAVENOUS | Status: DC | PRN
  Filled 2012-05-01: qty 2

## 2012-05-01 NOTE — Progress Notes (Signed)
Jade Palmer is a 22 y.o. G1P0 at [redacted]w[redacted]d by LMP admitted for pre-eclampsia  Subjective: Headache: 9/10 headache located over temples bilaterally extending posteriorly associated with vision changes and n/v.  Pt states headache improved slightly with Tylenol to 7/10.  Denies neck stiffness, focal neurological deficits, RUQ pain or abdominal pain.  Nothing makes the headaches worse.  No hx of migraines.   Objective: BP 131/77  Pulse 89  Temp(Src) 97.8 F (36.6 C) (Oral)  Resp 22  Ht 5' 6.5" (1.689 m)  Wt 103.42 kg (228 lb)  BMI 36.25 kg/m2  LMP 07/23/2011 I/O last 3 completed shifts: In: 1117.1 [P.O.:90; I.V.:1027.1] Out: 400 [Urine:400]   General: mild discomfort Cardiac: RRR, no m/g/r Lungs: CLAB no w/r/r Neuro: CN 2-12 grossly intact, 3+ reflexes bilaterally upper and lower extremities, PEARLA, EOM intact, no cerebellar ataxia appreciated   FHT:  FHR: 130 bpm, variability: moderate,  accelerations:  Present,  decelerations:  Absent UC:   none SVE:      Labs: Lab Results  Component Value Date   WBC 10.1 04/30/2012   HGB 9.9* 04/30/2012   HCT 30.0* 04/30/2012   MCV 86.7 04/30/2012   PLT 143* 04/30/2012   Results for orders placed during the hospital encounter of 04/30/12 (from the past 24 hour(s))  PROTEIN / CREATININE RATIO, URINE     Status: None   Collection Time    04/30/12  1:15 PM      Result Value Range   Creatinine, Urine 56.13     Total Protein, Urine 8.6     PROTEIN CREATININE RATIO 0.15  0.00 - 0.15  URINALYSIS, ROUTINE W REFLEX MICROSCOPIC     Status: Abnormal   Collection Time    04/30/12  1:15 PM      Result Value Range   Color, Urine YELLOW  YELLOW   APPearance HAZY (*) CLEAR   Specific Gravity, Urine 1.010  1.005 - 1.030   pH 7.0  5.0 - 8.0   Glucose, UA NEGATIVE  NEGATIVE mg/dL   Hgb urine dipstick NEGATIVE  NEGATIVE   Bilirubin Urine NEGATIVE  NEGATIVE   Ketones, ur NEGATIVE  NEGATIVE mg/dL   Protein, ur NEGATIVE  NEGATIVE mg/dL   Urobilinogen, UA  0.2  0.0 - 1.0 mg/dL   Nitrite NEGATIVE  NEGATIVE   Leukocytes, UA TRACE (*) NEGATIVE  URINE MICROSCOPIC-ADD ON     Status: Abnormal   Collection Time    04/30/12  1:15 PM      Result Value Range   Squamous Epithelial / LPF MANY (*) RARE   WBC, UA 0-2  <3 WBC/hpf   RBC / HPF 0-2  <3 RBC/hpf   Bacteria, UA RARE  RARE   Urine-Other YEAST    URINE RAPID DRUG SCREEN (HOSP PERFORMED)     Status: None   Collection Time    04/30/12  1:15 PM      Result Value Range   Opiates NONE DETECTED  NONE DETECTED   Cocaine NONE DETECTED  NONE DETECTED   Benzodiazepines NONE DETECTED  NONE DETECTED   Amphetamines NONE DETECTED  NONE DETECTED   Tetrahydrocannabinol NONE DETECTED  NONE DETECTED   Barbiturates NONE DETECTED  NONE DETECTED  CBC     Status: Abnormal   Collection Time    04/30/12  1:40 PM      Result Value Range   WBC 10.1  4.0 - 10.5 K/uL   RBC 3.46 (*) 3.87 - 5.11 MIL/uL   Hemoglobin 9.9 (*)  12.0 - 15.0 g/dL   HCT 16.1 (*) 09.6 - 04.5 %   MCV 86.7  78.0 - 100.0 fL   MCH 28.6  26.0 - 34.0 pg   MCHC 33.0  30.0 - 36.0 g/dL   RDW 40.9  81.1 - 91.4 %   Platelets 143 (*) 150 - 400 K/uL  COMPREHENSIVE METABOLIC PANEL     Status: Abnormal   Collection Time    04/30/12  1:40 PM      Result Value Range   Sodium 134 (*) 135 - 145 mEq/L   Potassium 3.9  3.5 - 5.1 mEq/L   Chloride 101  96 - 112 mEq/L   CO2 18 (*) 19 - 32 mEq/L   Glucose, Bld 121 (*) 70 - 99 mg/dL   BUN 7  6 - 23 mg/dL   Creatinine, Ser 7.82  0.50 - 1.10 mg/dL   Calcium 9.7  8.4 - 95.6 mg/dL   Total Protein 6.3  6.0 - 8.3 g/dL   Albumin 2.5 (*) 3.5 - 5.2 g/dL   AST 12  0 - 37 U/L   ALT 7  0 - 35 U/L   Alkaline Phosphatase 108  39 - 117 U/L   Total Bilirubin 0.2 (*) 0.3 - 1.2 mg/dL   GFR calc non Af Amer >90  >90 mL/min   GFR calc Af Amer >90  >90 mL/min  TYPE AND SCREEN     Status: None   Collection Time    04/30/12  1:40 PM      Result Value Range   ABO/RH(D) O NEG     Antibody Screen POS     Sample  Expiration 05/03/2012     Antibody Identification PASSIVELY ACQUIRED ANTI-D     DAT, 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     Unit Number [id number]     Blood Component Type RBC CPDA1, LR     Unit division 00     Status of Unit ALLOCATED     Transfusion Status OK TO TRANSFUSE     Crossmatch Result COMPATIBLE     Unit Number [id number]     Blood Component Type RBC LR PHER1     Unit division 00     Status of Unit ALLOCATED     Transfusion Status OK TO TRANSFUSE     Crossmatch Result COMPATIBLE      I/O: 0.51mL/hr/kg  Assessment / Plan: Pre-eclampsia -- plt's decreased to 143 from 167 -- LFT's wnl -- bilirubin wnl -- f/u mag level  -- cn't labetalol for htn -- percocet 3/325 for pain -- cn't to monitor  -- anticipate induction/c-section if headache not improved w/ percocet   Labor: none Preeclampsia:  on magnesium sulfate, no signs or symptoms of toxicity, intake and ouput balanced and labs stable Fetal Wellbeing:  Category I Pain Control:  None I/D:  n/a Anticipated MOD:  NSVD  Gregor Hams 05/01/2012, 10:03 AM

## 2012-05-01 NOTE — Progress Notes (Signed)
Patient ID: Jade Palmer, female   DOB: 04/05/1990, 22 y.o.   MRN: [id number] Pt. Continues to have 9-10/10. Still with spots in her vision.  She got no relief of headache with Percocet. VE 2/80/-2.  Will move to L and D and start Pitocin.  PCN for GBS prophylaxis.

## 2012-05-01 NOTE — Progress Notes (Signed)
Jade Palmer is a 22 y.o. G1P0 at [redacted]w[redacted]d by LMP admitted for pre-eclampsia  Subjective: Headache improved slightly, no scotoma currently. Beginning to feel some contractions but not too severe yet.   Objective: BP 120/62  Pulse 91  Temp(Src) 98.1 F (36.7 C) (Oral)  Resp 18  Ht 5' 6.5" (1.689 m)  Wt 103.42 kg (228 lb)  BMI 36.25 kg/m2  LMP 07/23/2011 I/O last 3 completed shifts: In: 3205.9 [P.O.:825; I.V.:2130.9; IV Piggyback:250] Out: 3000 [Urine:2375; Emesis/NG output:625] Total I/O In: 200.1 [I.V.:100.1; IV Piggyback:100] Out: 150 [Urine:150]  Neuro: 3+ DTRs,   FHT:  FHR: 130 bpm, variability: moderate,  accelerations:  Present,  decelerations:  Absent UC:   irreg q 1-3 min SVE:   Dilation: 3 Effacement (%): 80 Station: -2 Exam by:: Dr. Ermalinda Memos  Labs: Lab Results  Component Value Date   WBC 11.0* 05/01/2012   HGB 8.8* 05/01/2012   HCT 26.8* 05/01/2012   MCV 87.6 05/01/2012   PLT 151 05/01/2012    Recent Labs Lab 04/30/12 1340 05/01/12 0946  HGB 9.9* 8.8*  HCT 30.0* 26.8*  WBC 10.1 11.0*  PLT 143* 151    Recent Labs Lab 04/30/12 1340 05/01/12 0946  NA 134* 133*  K 3.9 4.2  CL 101 100  CO2 18* 20  GLUCOSE 121* 87  BUN 7 7  CREATININE 0.51 0.53  CALCIUM 9.7 8.4  MG  --  4.3*     Intake/Output Summary (Last 24 hours) at 05/01/12 2122 Last data filed at 05/01/12 2000  Gross per 24 hour  Intake 3405.91 ml  Output   3150 ml  Net 255.91 ml     Assessment / Plan: 22 y/o G1P0 here at 35.3 being induced for pre-eclampsia  Labor: Progressing on Pitocin, will continue to increase then AROM Preeclampsia:  on magnesium sulfate and Symptoms improved currently, DTRs still hyperreactive Fetal Wellbeing:  Category I Pain Control:  Fentanyl and epidural by request I/D:  GBS unknown, pennicillin per protocol Anticipated MOD:  NSVD  Kevin Fenton 05/01/2012, 9:17 PM

## 2012-05-01 NOTE — Progress Notes (Signed)
Pt. Transferred to room 163; report given to Liliana Cline, RN; all belongings are will patient

## 2012-05-01 NOTE — Progress Notes (Signed)
See separate note.  Pt seen and examined with resident.  Agree with assessment and plan.  Labs reviewed and essentially unchanged.

## 2012-05-01 NOTE — Progress Notes (Signed)
Patient ID: Jade Palmer, female   DOB: 1990-03-18, 22 y.o.   MRN: [id number]  I have seen and examined the pt.  She continues to have headache and vision changes despite Magnesium. She is having emesis and LUQ pain.  She has a h/o seizure d/o and was previously on Keppra and Tegretol.  She self-D/C'd these, she has had no seizure activity x 3 years.  BP improved on Labetalol and Magnesium.  Will give medication for headache and await most recent labs.  Delivery with another severe range BP, persistent headache or worsening labs.  FHR remains reassuring.

## 2012-05-02 LAB — CBC
HCT: 27.7 % — ABNORMAL LOW (ref 36.0–46.0)
MCHC: 32.5 g/dL (ref 30.0–36.0)
Platelets: 154 10*3/uL (ref 150–400)
RDW: 15.3 % (ref 11.5–15.5)
WBC: 8.6 10*3/uL (ref 4.0–10.5)

## 2012-05-02 MED ORDER — OXYTOCIN 40 UNITS IN LACTATED RINGERS INFUSION - SIMPLE MED
1.0000 m[IU]/min | INTRAVENOUS | Status: DC
  Administered 2012-05-03: 2 m[IU]/min via INTRAVENOUS
  Filled 2012-05-02: qty 1000

## 2012-05-02 MED ORDER — MISOPROSTOL 25 MCG QUARTER TABLET
25.0000 ug | ORAL_TABLET | ORAL | Status: DC
  Administered 2012-05-02: 25 ug via VAGINAL
  Filled 2012-05-02: qty 0.25

## 2012-05-02 MED ORDER — OXYTOCIN 40 UNITS IN LACTATED RINGERS INFUSION - SIMPLE MED: 1.0000 m[IU]/min | INTRAVENOUS | Status: DC

## 2012-05-02 NOTE — Progress Notes (Signed)
Jade Palmer is a 22 y.o. G1P0 at [redacted]w[redacted]d by LMP admitted for pre-eclampsia  Subjective: Pt mentions improved headache but continuously low grade.  Pt beginning to notice contraction pain that improves with fentanyl.    Objective: BP 135/86  Pulse 74  Temp(Src) 98.1 F (36.7 C) (Oral)  Resp 18  Ht 5' 6.5" (1.689 m)  Wt 103.42 kg (228 lb)  BMI 36.25 kg/m2  LMP 07/23/2011 I/O last 3 completed shifts: In: 5298.9 [P.O.:1245; I.V.:3503.9; IV Piggyback:550] Out: 4250 [Urine:3625; Emesis/NG output:625] Total I/O In: 1445.5 [P.O.:480; I.V.:765.5; IV Piggyback:200] Out: 1650 [Urine:1650]  Neuro: 3+ DTRs, No clonus  FHT:  FHR: 120 bpm, variability: moderate,  accelerations:  Present,  decelerations:  Absent UC:   iirreg 2-4 min SVE:   Dilation: 3 Effacement (%): 80 Station: Ballotable Exam by:: hk  Labs: Lab Results  Component Value Date   WBC 8.6 05/02/2012   HGB 9.0* 05/02/2012   HCT 27.7* 05/02/2012   MCV 88.2 05/02/2012   PLT 154 05/02/2012    Recent Labs Lab 04/30/12 1340 05/01/12 0946 05/02/12 1046  HGB 9.9* 8.8* 9.0*  HCT 30.0* 26.8* 27.7*  WBC 10.1 11.0* 8.6  PLT 143* 151 154    Recent Labs Lab 04/30/12 1340 05/01/12 0946  NA 134* 133*  K 3.9 4.2  CL 101 100  CO2 18* 20  GLUCOSE 121* 87  BUN 7 7  CREATININE 0.51 0.53  CALCIUM 9.7 8.4  MG  --  4.3*     Intake/Output Summary (Last 24 hours) at 05/02/12 1354 Last data filed at 05/02/12 1307  Gross per 24 hour  Intake 4887.25 ml  Output   4375 ml  Net 512.25 ml     Assessment / Plan: 22 y/o G1P0 here at 35.3 being induced for pre-eclampsia -- labs stable -- urine out 1.4 ml/kg/hr  -- cbc for epidural placement -- pit at 24 per hr, continue to titrate  Labor: Arrested at 3 cm cervical dilation, pitocin being titrated Preeclampsia:  on magnesium sulfate and Symptoms improved currently Fetal Wellbeing:  Category I Pain Control:  Fentanyl and epidural by request I/D:  GBS unknown, pennicillin per  protocol as she is 35.4 Anticipated MOD:  NSVD  Gregor Hams 05/02/2012, 1:54 PM

## 2012-05-02 NOTE — Progress Notes (Signed)
Jade Palmer is a 22 y.o. G1P0 at [redacted]w[redacted]d by LMP admitted for pre-eclampsia  Subjective: Headache improved with sleep, no scotoma currently.   Objective: BP 104/53  Pulse 84  Temp(Src) 98.1 F (36.7 C) (Oral)  Resp 18  Ht 5' 6.5" (1.689 m)  Wt 103.42 kg (228 lb)  BMI 36.25 kg/m2  LMP 07/23/2011 I/O last 3 completed shifts: In: 3205.9 [P.O.:825; I.V.:2130.9; IV Piggyback:250] Out: 3000 [Urine:2375; Emesis/NG output:625] Total I/O In: 470.1 [P.O.:120; I.V.:250.1; IV Piggyback:100] Out: 500 [Urine:500]  Neuro: 3+ DTRs,   FHT:  FHR: 120 bpm, variability: moderate,  accelerations:  Present,  decelerations:  Absent UC:   irreg q 1-3 min SVE:   Dilation: 3 Effacement (%): 80 Station: -2 Exam by:: Dr. Ermalinda Memos  Labs: Lab Results  Component Value Date   WBC 11.0* 05/01/2012   HGB 8.8* 05/01/2012   HCT 26.8* 05/01/2012   MCV 87.6 05/01/2012   PLT 151 05/01/2012    Recent Labs Lab 04/30/12 1340 05/01/12 0946  HGB 9.9* 8.8*  HCT 30.0* 26.8*  WBC 10.1 11.0*  PLT 143* 151    Recent Labs Lab 04/30/12 1340 05/01/12 0946  NA 134* 133*  K 3.9 4.2  CL 101 100  CO2 18* 20  GLUCOSE 121* 87  BUN 7 7  CREATININE 0.51 0.53  CALCIUM 9.7 8.4  MG  --  4.3*     Intake/Output Summary (Last 24 hours) at 05/02/12 0016 Last data filed at 05/01/12 2100  Gross per 24 hour  Intake 3675.96 ml  Output   3500 ml  Net 175.96 ml     Assessment / Plan: 22 y/o G1P0 here at 23.0 being induced for pre-eclampsia  Labor: Arrested at 3 cm cervical dilation for 3 hours, will take break from pitocin and re-start in 3 hours.  Preeclampsia:  on magnesium sulfate and Symptoms improved currently Fetal Wellbeing:  Category I Pain Control:  Fentanyl and epidural by request I/D:  GBS unknown, pennicillin per protocol Anticipated MOD:  NSVD  Kevin Fenton 05/02/2012, 12:16 AM

## 2012-05-02 NOTE — Progress Notes (Signed)
Jade Palmer is a 22 y.o. G1P0 at [redacted]w[redacted]d by LMP admitted for pre-eclampsia  Subjective: Headache the same, no scotoma, No complaints otherwise  Objective: BP 106/63  Pulse 79  Temp(Src) 98.1 F (36.7 C) (Oral)  Resp 20  Ht 5' 6.5" (1.689 m)  Wt 103.42 kg (228 lb)  BMI 36.25 kg/m2  LMP 07/23/2011 I/O last 3 completed shifts: In: 3205.9 [P.O.:825; I.V.:2130.9; IV Piggyback:250] Out: 3000 [Urine:2375; Emesis/NG output:625] Total I/O In: 1784.5 [P.O.:360; I.V.:1124.5; IV Piggyback:300] Out: 1250 [Urine:1250]  Neuro: 3+ DTRs, No clonus  FHT:  FHR: 120 bpm, variability: moderate,  accelerations:  Present,  decelerations:  Absent UC:   iirreg 4-8 min SVE:   Dilation: 3 Effacement (%): 80 Station: -2 Exam by:: Dr. Ermalinda Memos  Labs: Lab Results  Component Value Date   WBC 11.0* 05/01/2012   HGB 8.8* 05/01/2012   HCT 26.8* 05/01/2012   MCV 87.6 05/01/2012   PLT 151 05/01/2012    Recent Labs Lab 04/30/12 1340 05/01/12 0946  HGB 9.9* 8.8*  HCT 30.0* 26.8*  WBC 10.1 11.0*  PLT 143* 151    Recent Labs Lab 04/30/12 1340 05/01/12 0946  NA 134* 133*  K 3.9 4.2  CL 101 100  CO2 18* 20  GLUCOSE 121* 87  BUN 7 7  CREATININE 0.51 0.53  CALCIUM 9.7 8.4  MG  --  4.3*     Intake/Output Summary (Last 24 hours) at 05/02/12 0630 Last data filed at 05/02/12 0400  Gross per 24 hour  Intake 4623.27 ml  Output   4250 ml  Net 373.27 ml     Assessment / Plan: 22 y/o G1P0 here at 35.3 being induced for pre-eclampsia  Labor: Arrested at 3 cm cervical dilation, pitocin break given overnight, now back on pitocin upt to 8 milliunits Preeclampsia:  on magnesium sulfate and Symptoms improved currently Fetal Wellbeing:  Category I Pain Control:  Fentanyl and epidural by request I/D:  GBS unknown, pennicillin per protocol as she is 23.4 Anticipated MOD:  NSVD  Kevin Fenton 05/02/2012, 6:30 AM

## 2012-05-02 NOTE — Progress Notes (Signed)
I have seen and examined this patient and I agree with the above. SHAW, KIMBERLY 7:49 AM 05/02/2012    

## 2012-05-02 NOTE — Progress Notes (Signed)
   Jade Palmer is a 22 y.o. G1P0 at [redacted]w[redacted]d  admitted for induction of labor due to Pre-eclamptic toxemia of pregnancy..  Subjective: NO headache at all now  Objective: BP 111/66  Pulse 82  Temp(Src) 98.4 F (36.9 C) (Oral)  Resp 18  Ht 5' 6.5" (1.689 m)  Wt 103.42 kg (228 lb)  BMI 36.25 kg/m2  LMP 07/23/2011 Total I/O In: -  Out: 250 [Urine:250]  FHT:  FHR: 120 bpm, variability: moderate,  accelerations:  Present,  decelerations:  Absent UC:   none SVE:   Dilation: 3 Effacement (%): 80 Station: -2 Exam by:: Bertram Millard RN, N Deal RN MgSO4 @ 23 gm/hr Cytotec was placed   Labs: Lab Results  Component Value Date   WBC 8.6 05/02/2012   HGB 9.0* 05/02/2012   HCT 27.7* 05/02/2012   MCV 88.2 05/02/2012   PLT 154 05/02/2012    Assessment / Plan: IOL for Preeclampsia; ripening  Labor: no Fetal Wellbeing:  Category I Pain Control:  Labor support without medications Anticipated MOD:  NSVD  CRESENZO-DISHMAN,Jade Palmer 05/02/2012, 10:45 PM

## 2012-05-02 NOTE — Progress Notes (Signed)
I have seen and examined this patient and I agree with the above. SHAW, KIMBERLY 7:46 AM 05/02/2012    

## 2012-05-02 NOTE — Progress Notes (Signed)
I have seen and examined this patient and I agree with the above. Jade Palmer 1:54 AM 05/02/2012    

## 2012-05-02 NOTE — Progress Notes (Signed)
Jade Palmer is a 22 y.o. G1P0 at [redacted]w[redacted]d by LMP admitted for pre-eclampsia  Subjective: Headache the same, no scotoma, No complaints otherwise  Objective: BP 118/62  Pulse 67  Temp(Src) 98.1 F (36.7 C) (Oral)  Resp 18  Ht 5' 6.5" (1.689 m)  Wt 103.42 kg (228 lb)  BMI 36.25 kg/m2  LMP 07/23/2011 I/O last 3 completed shifts: In: 3205.9 [P.O.:825; I.V.:2130.9; IV Piggyback:250] Out: 3000 [Urine:2375; Emesis/NG output:625] Total I/O In: 1434.5 [P.O.:360; I.V.:874.5; IV Piggyback:200] Out: 875 [Urine:875]  Neuro: 3+ DTRs, No clonus  FHT:  FHR: 110 bpm, variability: moderate,  accelerations:  Present,  decelerations:  Absent UC:   iirreg 10 + minutes apart SVE:   Dilation: 3 Effacement (%): 80 Station: -2 Exam by:: Dr. Ermalinda Memos  Labs: Lab Results  Component Value Date   WBC 11.0* 05/01/2012   HGB 8.8* 05/01/2012   HCT 26.8* 05/01/2012   MCV 87.6 05/01/2012   PLT 151 05/01/2012    Recent Labs Lab 04/30/12 1340 05/01/12 0946  HGB 9.9* 8.8*  HCT 30.0* 26.8*  WBC 10.1 11.0*  PLT 143* 151    Recent Labs Lab 04/30/12 1340 05/01/12 0946  NA 134* 133*  K 3.9 4.2  CL 101 100  CO2 18* 20  GLUCOSE 121* 87  BUN 7 7  CREATININE 0.51 0.53  CALCIUM 9.7 8.4  MG  --  4.3*     Intake/Output Summary (Last 24 hours) at 05/02/12 0406 Last data filed at 05/02/12 0200  Gross per 24 hour  Intake 4273.27 ml  Output   3875 ml  Net 398.27 ml     Assessment / Plan: 22 y/o G1P0 here at 35.3 being induced for pre-eclampsia  Labor: Arrested at 3 cm cervical dilation for 3 hours. restarting pit after 3 hour break.  Preeclampsia:  on magnesium sulfate and Symptoms improved currently Fetal Wellbeing:  Category I Pain Control:  Fentanyl and epidural by request I/D:  GBS unknown, pennicillin per protocol Anticipated MOD:  NSVD  Kevin Fenton 05/02/2012, 4:06 AM

## 2012-05-02 NOTE — Progress Notes (Signed)
I have seen and examined this patient and I agree with the above. Jade Palmer 1:53 AM 05/02/2012    

## 2012-05-02 NOTE — OB Triage Provider Note (Signed)
Pt seen and examined.  Attestation of Attending Supervision of Advanced Practitioner (CNM/NP): Evaluation and management procedures were performed by the Advanced Practitioner under my supervision and collaboration. I have reviewed the Advanced Practitioner's note and chart, and I agree with the management and plan.  Adelynne Joerger H. 11:51 AM

## 2012-05-02 NOTE — Progress Notes (Signed)
Jade Palmer is a 22 y.o. G1P0 at [redacted]w[redacted]d by LMP admitted for pre-eclampsia  Subjective: Pt mentions improved headache, worse when ambulating but improves with laying down.  Pt mentions starting to feel contractions, but not increasing in frequency   Objective: BP 138/87  Pulse 84  Temp(Src) 98.2 F (36.8 C) (Oral)  Resp 18  Ht 5' 6.5" (1.689 m)  Wt 103.42 kg (228 lb)  BMI 36.25 kg/m2  LMP 07/23/2011 I/O last 3 completed shifts: In: 5298.9 [P.O.:1245; I.V.:3503.9; IV Piggyback:550] Out: 4250 [Urine:3625; Emesis/NG output:625] Total I/O In: 601.2 [P.O.:240; I.V.:261.2; IV Piggyback:100] Out: 800 [Urine:800]  Neuro: 3+ DTRs, No clonus  FHT:  FHR: 120 bpm, variability: moderate,  accelerations:  Present,  decelerations:  Absent UC:   iirreg 2-4 min SVE:   Dilation: 3 Effacement (%): 80 Station: -2 Exam by:: Dr. Ermalinda Memos  Labs: Lab Results  Component Value Date   WBC 11.0* 05/01/2012   HGB 8.8* 05/01/2012   HCT 26.8* 05/01/2012   MCV 87.6 05/01/2012   PLT 151 05/01/2012    Recent Labs Lab 04/30/12 1340 05/01/12 0946  HGB 9.9* 8.8*  HCT 30.0* 26.8*  WBC 10.1 11.0*  PLT 143* 151    Recent Labs Lab 04/30/12 1340 05/01/12 0946  NA 134* 133*  K 3.9 4.2  CL 101 100  CO2 18* 20  GLUCOSE 121* 87  BUN 7 7  CREATININE 0.51 0.53  CALCIUM 9.7 8.4  MG  --  4.3*     Intake/Output Summary (Last 24 hours) at 05/02/12 1040 Last data filed at 05/02/12 1610  Gross per 24 hour  Intake 4287.97 ml  Output   4075 ml  Net 212.97 ml     Assessment / Plan: 22 y/o G1P0 here at 35.3 being induced for pre-eclampsia -- labs stable -- urine out 1.4 ml/kg/hr  -- cbc for epidural placement  Labor: Arrested at 3 cm cervical dilation, pitocin being titrated Preeclampsia:  on magnesium sulfate and Symptoms improved currently Fetal Wellbeing:  Category I Pain Control:  Fentanyl and epidural by request I/D:  GBS unknown, pennicillin per protocol as she is 35.4 Anticipated MOD:   NSVD  Gregor Hams 05/02/2012, 10:40 AM

## 2012-05-02 NOTE — Progress Notes (Signed)
   Kailany Dinunzio is a 22 y.o. G1P0 at [redacted]w[redacted]d  admitted for induction of labor due to Pre-eclamptic toxemia of pregnancy..  Subjective: No c/o of contractions; still persistent though dull HA.  Has eaten and Pitocin has been off for a few hours.  Dr. Despina Hidden in earlier to see pt and formulate plan  Objective: BP 132/78  Pulse 90  Temp(Src) 98.4 F (36.9 C) (Oral)  Resp 18  Ht 5' 6.5" (1.689 m)  Wt 103.42 kg (228 lb)  BMI 36.25 kg/m2  LMP 07/23/2011    FHT:  FHR: 120 bpm, variability: moderate,  accelerations:  Present,  decelerations:  Absent UC:   none SVE:   Dilation: 3 Effacement (%): 80 Station: Ballotable Exam by:: hk  Labs: Lab Results  Component Value Date   WBC 8.6 05/02/2012   HGB 9.0* 05/02/2012   HCT 27.7* 05/02/2012   MCV 88.2 05/02/2012   PLT 154 05/02/2012  MgSO4 at 2 gm/hr  Assessment / Plan: IOL for preeclampsia, ripening phase; slow Will try cytotec Labor: no Fetal Wellbeing:  Category I Pain Control:  Labor support without medications Anticipated MOD:  NSVD  CRESENZO-DISHMAN,Clarence Cogswell 05/02/2012, 7:48 PM

## 2012-05-03 ENCOUNTER — Encounter (HOSPITAL_COMMUNITY): Payer: Self-pay | Admitting: Anesthesiology

## 2012-05-03 ENCOUNTER — Inpatient Hospital Stay (HOSPITAL_COMMUNITY): Payer: Medicaid Other | Admitting: Anesthesiology

## 2012-05-03 LAB — CBC
HCT: 26.1 % — ABNORMAL LOW (ref 36.0–46.0)
Hemoglobin: 8.6 g/dL — ABNORMAL LOW (ref 12.0–15.0)
MCH: 28.2 pg (ref 26.0–34.0)
MCHC: 33 g/dL (ref 30.0–36.0)
MCHC: 31.8 g/dL (ref 30.0–36.0)
MCV: 88.2 fL (ref 78.0–100.0)
MCV: 88.6 fL (ref 78.0–100.0)
Platelets: 144 10*3/uL — ABNORMAL LOW (ref 150–400)
RDW: 15.3 % (ref 11.5–15.5)

## 2012-05-03 LAB — TYPE AND SCREEN: Unit division: 0

## 2012-05-03 MED ORDER — FENTANYL 2.5 MCG/ML BUPIVACAINE 1/10 % EPIDURAL INFUSION (WH - ANES)
INTRAMUSCULAR | Status: DC | PRN
  Administered 2012-05-03: 14 mL/h via EPIDURAL

## 2012-05-03 MED ORDER — LIDOCAINE HCL (PF) 1 % IJ SOLN
INTRAMUSCULAR | Status: DC | PRN
  Administered 2012-05-03 (×2): 4 mL

## 2012-05-03 MED ORDER — MISOPROSTOL 25 MCG QUARTER TABLET
25.0000 ug | ORAL_TABLET | ORAL | Status: AC
  Administered 2012-05-03: 25 ug via VAGINAL
  Filled 2012-05-03: qty 0.25

## 2012-05-03 MED ORDER — FENTANYL 2.5 MCG/ML BUPIVACAINE 1/10 % EPIDURAL INFUSION (WH - ANES): 14.0000 mL/h | INTRAMUSCULAR | Status: DC | PRN

## 2012-05-03 NOTE — Progress Notes (Signed)
Jade Palmer is a 22 y.o. G1P0 at [redacted]w[redacted]d  admitted for induction of labor due to Pre-eclamptic toxemia of pregnancy..  Subjective: Pt continues to mention improved headache no mag. Pt notes feeling stronger more frequent contractions.     Objective: BP 104/55  Pulse 70  Temp(Src) 98.2 F (36.8 C) (Oral)  Resp 20  Ht 5' 6.5" (1.689 m)  Wt 103.42 kg (228 lb)  BMI 36.25 kg/m2  LMP 07/23/2011 Total I/O In: 1677.9 [P.O.:660; I.V.:1017.9] Out: 1775 [Urine:1775]  FHT:  FHR: 135 bpm, variability: moderate,  accelerations:  Present,  decelerations:  Absent UC:   Regular 3-5 minutes SVE:   Dilation: 3.5 Effacement (%): 80;90 Station: -2 Exam by:: Dr. Anastasia Fiedler & Enis Slipper, RN MgSO4 @ 2 gm/hr   Labs: Lab Results  Component Value Date   WBC 6.8 05/03/2012   HGB 8.6* 05/03/2012   HCT 26.1* 05/03/2012   MCV 88.2 05/03/2012   PLT 134* 05/03/2012    Assessment / Plan: IOL for Preeclampsia -- continue pitocin -- recheck cervix in evening if contraction pattern persists    Labor: Progressing on Pitocin, will continue to increase then AROM Fetal Wellbeing:  Category I Pain Control:  Labor support without medications, plan epidural Anticipated MOD:  NSVD  Gregor Hams 05/03/2012, 3:59 PM

## 2012-05-03 NOTE — Progress Notes (Signed)
   Subjective: Pt reports increased pain with contractions.    Objective: BP 137/83  Pulse 83  Temp(Src) 98.4 F (36.9 C) (Oral)  Resp 18  Ht 5' 6.5" (1.689 m)  Wt 103.42 kg (228 lb)  BMI 36.25 kg/m2  LMP 07/23/2011 I/O last 3 completed shifts: In: 8032.8 [P.O.:2622; I.V.:4710.8; IV Piggyback:700] Out: 6925 [Urine:6925]    FHT:  FHR: 130's bpm, variability: moderate,  accelerations:  Present,  decelerations:  Absent UC:   irregular, every 2-5 minutes SVE:   Dilation: 4 Effacement (%): 80;90 Station: -2;Ballotable Exam by:: Roney Marion, CNM  Labs: Lab Results  Component Value Date   WBC 6.8 05/03/2012   HGB 8.6* 05/03/2012   HCT 26.1* 05/03/2012   MCV 88.2 05/03/2012   PLT 134* 05/03/2012    Assessment / Plan: Augmentation of Labor  Labor: Augmentation of Labor Preeclampsia:  labs stable Fetal Wellbeing:  Category I Pain Control:  Labor support without medications I/D:  n/a Anticipated MOD:  NSVD  [facility] 05/03/2012, 7:38 PM

## 2012-05-03 NOTE — Progress Notes (Addendum)
Pitocin was started at 0937. Dr. Anastasia Fiedler in to see pt at 0945. Stated Dr. Jolayne Panther & Sid Falcon, CNM have discussed waiting to start Pitocin tonight, in order to give her body a break.  Turned off Pitocin at 0945 per his order, but  pt & this RN want to proceed with Pitocin now.  This RN informed Dr. Anastasia Fiedler that Pitocin has been off for almost 15 hrs (turned off at 1924 per documentation & pt agrees it was at that time), so could we proceed with it now?  He stated CNM & Dr. Jolayne Panther are aware pt was off of Pitocin for the night, but still want this plan of care.  Pt also stated she would like Pitocin now, & she would prefer to have it stopped tonight instead of now.  He stated CNM will be in soon to see pt and evaluate. At 1000, Sid Falcon, CNM, in to see pt.  This RN informed her that Pitocin has been off for about 15 hours, and could we proceed with it now.  Pt also stated she would like it to be started now as well.  CNM stated was not aware the length of time Pitocin was off, & may be restarted now.  Restarted at 1002 at 2 mu/min.

## 2012-05-03 NOTE — Progress Notes (Signed)
Jade Palmer is a 22 y.o. G1P0 at [redacted]w[redacted]d  admitted for induction of labor due to Pre-eclamptic toxemia of pregnancy..  Subjective: Pt mentions improved headache.  Pt mentions beginning to feel regular contractions that are increasing in intensity.  Continued fetal movement.  No concerns, no complaints.    Objective: BP 135/86  Pulse 76  Temp(Src) 98.2 F (36.8 C) (Oral)  Resp 20  Ht 5' 6.5" (1.689 m)  Wt 103.42 kg (228 lb)  BMI 36.25 kg/m2  LMP 07/23/2011 Total I/O In: 1534.1 [P.O.:660; I.V.:874.1] Out: 1475 [Urine:1475]  FHT:  FHR: 120 bpm, variability: moderate,  accelerations:  Present,  decelerations:  Absent UC:   Regular 3-5 minutes SVE:   Dilation: 3 Effacement (%): 70;80 Station: -2 Exam by:: Enis Slipper, RN MgSO4 @ 2 gm/hr   Labs: Lab Results  Component Value Date   WBC 6.8 05/03/2012   HGB 8.6* 05/03/2012   HCT 26.1* 05/03/2012   MCV 88.2 05/03/2012   PLT 134* 05/03/2012    Assessment / Plan: IOL for Preeclampsia -- contineu pitocin -- plan cervical check in 1 hour if maintains regular contraction pattern   Labor: Progressing on Pitocin, will continue to increase then AROM Fetal Wellbeing:  Category I Pain Control:  Labor support without medications, plan epidural Anticipated MOD:  NSVD  Gregor Hams 05/03/2012, 2:17 PM

## 2012-05-03 NOTE — Progress Notes (Signed)
Jade Palmer is a 22 y.o. G1P0 at [redacted]w[redacted]d  admitted for induction of labor due to Pre-eclamptic toxemia of pregnancy..  Subjective: Pt mentions improved headache, no contraction type pain.  Continued fetal movement.  No concerns, no complaints.    Objective: BP 143/74  Pulse 83  Temp(Src) 98.1 F (36.7 C) (Oral)  Resp 20  Ht 5' 6.5" (1.689 m)  Wt 103.42 kg (228 lb)  BMI 36.25 kg/m2  LMP 07/23/2011 Total I/O In: 365 [P.O.:240; I.V.:125] Out: 145 [Urine:145]  FHT:  FHR: 130 bpm, variability: moderate,  accelerations:  Present,  decelerations:  Absent UC:   none SVE:   Dilation: 3 Effacement (%): 80 Station: -2 Exam by:: AL Rinehart MgSO4 @ 2 gm/hr   Labs: Lab Results  Component Value Date   WBC 8.6 05/02/2012   HGB 9.0* 05/02/2012   HCT 27.7* 05/02/2012   MCV 88.2 05/02/2012   PLT 154 05/02/2012    Assessment / Plan: IOL for Preeclampsia -- cervical ripening with cytotec X2 overnight -- plan to restart pitocin -- consider foley bulb placement  Labor: ripening with cytotec X2 , starting pitocin Fetal Wellbeing:  Category I Pain Control:  Labor support without medications Anticipated MOD:  NSVD  Gregor Hams 05/03/2012, 8:57 AM

## 2012-05-03 NOTE — Anesthesia Preprocedure Evaluation (Signed)
Anesthesia Evaluation  Patient identified by MRN, date of birth, ID band Patient awake    Reviewed: Allergy & Precautions, H&P , Patient's Chart, lab work & pertinent test results  Airway Mallampati: III TM Distance: >3 FB Neck ROM: Full    Dental no notable dental hx. (+) Teeth Intact   Pulmonary neg pulmonary ROS,  breath sounds clear to auscultation  Pulmonary exam normal       Cardiovascular hypertension, Rhythm:Regular Rate:Normal  Mild precclampsia    Neuro/Psych Seizures -,  negative psych ROS   GI/Hepatic negative GI ROS, Neg liver ROS,   Endo/Other  Obesity  Renal/GU negative Renal ROS  negative genitourinary   Musculoskeletal negative musculoskeletal ROS (+)   Abdominal (+) + obese,   Peds  Hematology negative hematology ROS (+)   Anesthesia Other Findings   Reproductive/Obstetrics PTL 35 weeks                           Anesthesia Physical Anesthesia Plan  ASA: II  Anesthesia Plan: Epidural   Post-op Pain Management:    Induction:   Airway Management Planned: Natural Airway  Additional Equipment:   Intra-op Plan:   Post-operative Plan:   Informed Consent: I have reviewed the patients History and Physical, chart, labs and discussed the procedure including the risks, benefits and alternatives for the proposed anesthesia with the patient or authorized representative who has indicated his/her understanding and acceptance.     Plan Discussed with: Anesthesiologist  Anesthesia Plan Comments:         Anesthesia Quick Evaluation

## 2012-05-03 NOTE — Anesthesia Procedure Notes (Signed)
Epidural Patient location during procedure: OB Start time: 05/03/2012 9:18 PM  Staffing Anesthesiologist: Aviraj Kentner A. Performed by: anesthesiologist   Preanesthetic Checklist Completed: patient identified, site marked, surgical consent, pre-op evaluation, timeout performed, IV checked, risks and benefits discussed and monitors and equipment checked  Epidural Patient position: sitting Prep: site prepped and draped and DuraPrep Patient monitoring: continuous pulse ox and blood pressure Approach: midline Injection technique: LOR air  Needle:  Needle type: Tuohy  Needle gauge: 17 G Needle length: 9 cm and 9 Needle insertion depth: 6 cm Catheter type: closed end flexible Catheter size: 19 Gauge Catheter at skin depth: 12 cm Test dose: negative and Other  Assessment Events: blood not aspirated, injection not painful, no injection resistance, negative IV test and no paresthesia  Additional Notes Patient identified. Risks and benefits discussed including failed block, incomplete  Pain control, post dural puncture headache, nerve damage, paralysis, blood pressure Changes, nausea, vomiting, reactions to medications-both toxic and allergic and post Partum back pain. All questions were answered. Patient expressed understanding and wished to proceed. Sterile technique was used throughout procedure. Epidural site was Dressed with sterile barrier dressing. No paresthesias, signs of intravascular injection Or signs of intrathecal spread were encountered.  Patient was more comfortable after the epidural was dosed. Please see RN's note for documentation of vital signs and FHR which are stable.

## 2012-05-03 NOTE — Progress Notes (Addendum)
Jade Palmer is a 22 y.o. G1P0 at [redacted]w[redacted]d  admitted for induction of labor due to Pre-eclamptic toxemia of pregnancy.  Subjective: Epidural placed due to increase in pain with contractions. No vision changes, HA, or RUQ pain at this time.  BPs controlled on Mag only.    Objective: BP 136/88  Pulse 82  Temp(Src) 98 F (36.7 C) (Oral)  Resp 18  Ht 5' 6.5" (1.689 m)  Wt 103.42 kg (228 lb)  BMI 36.25 kg/m2  SpO2 100%  LMP 07/23/2011 Total I/O In: 460 [I.V.:160; IV Piggyback:300] Out: 350 [Urine:350]  FHT:  FHR: 125 bpm, variability: moderate,  accelerations:  Present,  decelerations:  Absent UC:   Regular 2-4 minutes SVE:   Dilation: 4 Effacement (%): 90 Station: -2 Exam by:: and, W CNM; unchanged by Dr. Fara Boros MgSO4 @ 2 gm/hr   Labs: Lab Results  Component Value Date   WBC 9.4 05/03/2012   HGB 8.9* 05/03/2012   HCT 28.0* 05/03/2012   MCV 88.6 05/03/2012   PLT 144* 05/03/2012    Assessment / Plan: IOL for Preeclampsia -- continue pitocin   Labor: Progressing on Pitocin, will continue to increase then AROM for IUPC placement if no change in 2 hours Fetal Wellbeing:  Category I Pain Control:  Epidural Anticipated MOD:  NSVD  Jesse Nosbisch 05/03/2012, 10:09 PM

## 2012-05-03 NOTE — Progress Notes (Signed)
   Jade Palmer is a 22 y.o. G1P0 at [redacted]w[redacted]d  admitted for induction of labor due to Pre-eclamptic toxemia of pregnancy..  Subjective: No headache, no scotoma. No other complaints. Planning to eat before starting pitocin  Objective: BP 125/84  Pulse 80  Temp(Src) 98.2 F (36.8 C) (Oral)  Resp 18  Ht 5' 6.5" (1.689 m)  Wt 103.42 kg (228 lb)  BMI 36.25 kg/m2  LMP 07/23/2011 Total I/O In: 2505.3 [P.O.:702; I.V.:1503.3; IV Piggyback:300] Out: 1700 [Urine:1700]  FHT:  FHR: 120 bpm, variability: moderate,  accelerations:  Present,  decelerations:  Absent UC:   none SVE:   Dilation: 3 Effacement (%): 80 Station: -2 Exam by:: AL Rinehart MgSO4 @ 2 gm/hr Cytotec placed X 2, pitocin starting after 630 am.   Labs: Lab Results  Component Value Date   WBC 8.6 05/02/2012   HGB 9.0* 05/02/2012   HCT 27.7* 05/02/2012   MCV 88.2 05/02/2012   PLT 154 05/02/2012    Assessment / Plan: IOL for Preeclampsia; ripening with cytotec X2 overnight  Labor: ripening with cytotec X2 , starting pitocin Fetal Wellbeing:  Category I Pain Control:  Labor support without medications Anticipated MOD:  NSVD  Kevin Fenton 05/03/2012, 6:26 AM

## 2012-05-04 ENCOUNTER — Encounter (HOSPITAL_COMMUNITY): Payer: Self-pay | Admitting: *Deleted

## 2012-05-04 DIAGNOSIS — O1490 Unspecified pre-eclampsia, unspecified trimester: Secondary | ICD-10-CM

## 2012-05-04 MED ORDER — PRENATAL MULTIVITAMIN CH
1.0000 | ORAL_TABLET | Freq: Every day | ORAL | Status: DC
  Administered 2012-05-04 – 2012-05-06 (×3): 1 via ORAL
  Filled 2012-05-04 (×3): qty 1

## 2012-05-04 MED ORDER — BENZOCAINE-MENTHOL 20-0.5 % EX AERO
1.0000 "application " | INHALATION_SPRAY | CUTANEOUS | Status: DC | PRN
  Filled 2012-05-04: qty 56

## 2012-05-04 MED ORDER — WITCH HAZEL-GLYCERIN EX PADS: 1.0000 "application " | MEDICATED_PAD | CUTANEOUS | Status: DC | PRN

## 2012-05-04 MED ORDER — LACTATED RINGERS IV SOLN
INTRAVENOUS | Status: DC
  Administered 2012-05-04 – 2012-05-05 (×3): via INTRAVENOUS

## 2012-05-04 MED ORDER — SIMETHICONE 80 MG PO CHEW: 80.0000 mg | CHEWABLE_TABLET | ORAL | Status: DC | PRN

## 2012-05-04 MED ORDER — ONDANSETRON HCL 4 MG/2ML IJ SOLN: 4.0000 mg | INTRAMUSCULAR | Status: DC | PRN

## 2012-05-04 MED ORDER — DIPHENHYDRAMINE HCL 25 MG PO CAPS: 25.0000 mg | ORAL_CAPSULE | Freq: Four times a day (QID) | ORAL | Status: DC | PRN

## 2012-05-04 MED ORDER — ZOLPIDEM TARTRATE 5 MG PO TABS: 5.0000 mg | ORAL_TABLET | Freq: Every evening | ORAL | Status: DC | PRN

## 2012-05-04 MED ORDER — TETANUS-DIPHTH-ACELL PERTUSSIS 5-2.5-18.5 LF-MCG/0.5 IM SUSP
0.5000 mL | Freq: Once | INTRAMUSCULAR | Status: AC
  Administered 2012-05-05: 0.5 mL via INTRAMUSCULAR
  Filled 2012-05-04: qty 0.5

## 2012-05-04 MED ORDER — OXYCODONE-ACETAMINOPHEN 5-325 MG PO TABS
1.0000 | ORAL_TABLET | ORAL | Status: DC | PRN
  Administered 2012-05-04: 1 via ORAL
  Administered 2012-05-04 – 2012-05-05 (×2): 2 via ORAL
  Administered 2012-05-05: 1 via ORAL
  Filled 2012-05-04 (×2): qty 1
  Filled 2012-05-04 (×2): qty 2

## 2012-05-04 MED ORDER — DIBUCAINE 1 % RE OINT: 1.0000 "application " | TOPICAL_OINTMENT | RECTAL | Status: DC | PRN

## 2012-05-04 MED ORDER — ONDANSETRON HCL 4 MG PO TABS: 4.0000 mg | ORAL_TABLET | ORAL | Status: DC | PRN

## 2012-05-04 MED ORDER — SENNOSIDES-DOCUSATE SODIUM 8.6-50 MG PO TABS
2.0000 | ORAL_TABLET | Freq: Every day | ORAL | Status: DC
  Administered 2012-05-04: 2 via ORAL

## 2012-05-04 MED ORDER — MAGNESIUM SULFATE 40 G IN LACTATED RINGERS - SIMPLE
2.0000 g/h | INTRAVENOUS | Status: AC
  Administered 2012-05-04: 2 g/h via INTRAVENOUS
  Filled 2012-05-04 (×2): qty 500

## 2012-05-04 MED ORDER — LANOLIN HYDROUS EX OINT: TOPICAL_OINTMENT | CUTANEOUS | Status: DC | PRN

## 2012-05-04 MED ORDER — IBUPROFEN 600 MG PO TABS
600.0000 mg | ORAL_TABLET | Freq: Four times a day (QID) | ORAL | Status: DC
  Administered 2012-05-04 – 2012-05-06 (×8): 600 mg via ORAL
  Filled 2012-05-04 (×9): qty 1

## 2012-05-04 NOTE — Progress Notes (Signed)
UR chart review completed.  

## 2012-05-04 NOTE — Progress Notes (Signed)
Jade Palmer is a 22 y.o. G1P0 at [redacted]w[redacted]d  admitted for induction of labor due to Pre-eclamptic toxemia of pregnancy.  Subjective: Feeling comfortable. No headache, vision changes. Excited she is dilated.     Objective: BP 135/78  Pulse 72  Temp(Src) 98.1 F (36.7 C) (Oral)  Resp 18  Ht 5' 6.5" (1.689 m)  Wt 103.42 kg (228 lb)  BMI 36.25 kg/m2  SpO2 97%  LMP 07/23/2011 Total I/O In: 1102 [P.O.:222; I.V.:480; IV Piggyback:400] Out: 650 [Urine:650]  FHT:  FHR: 120-130 bpm, variability: minimal ,  accelerations:  Present,  decelerations:  Present occasional variables, lates UC:   Regular 2 minutes SVE:   Dilation: 10 Effacement (%): 100 Station: 0 Exam by:: Jade Palmer; unchanged by Dr. Fara Palmer MgSO4 @ 2 gm/hr   Labs: Lab Results  Component Value Date   WBC 9.4 05/03/2012   HGB 8.9* 05/03/2012   HCT 28.0* 05/03/2012   MCV 88.6 05/03/2012   PLT 144* 05/03/2012    Assessment / Plan: IOL for Preeclampsia  Labor: active; decrease pitocin with lates and now complete; waiting for dad to get here to start pushing; labor down as long as FHT reassuring  Fetal Wellbeing:  Category I and Category II Pain Control:  Epidural Anticipated MOD:  NSVD  Jade Palmer 05/04/2012, 2:13 AM

## 2012-05-04 NOTE — Anesthesia Postprocedure Evaluation (Signed)
Anesthesia Post Note  Patient: @Jade Palmer @[facility]   Procedure(s) Performed: CLE/C/S  Anesthesia type: Epidural  Patient location: Mother/Baby  Post pain: Pain level controlled  Post assessment: Post-op Vital signs reviewed  Last Vitals: BP 140/82  Pulse 86  Temp(Src) 36.6 C (Oral)  Resp 18  Ht 5\' 7"  (1.702 m)  Wt 223 lb 8 oz (101.379 kg)  BMI 35 kg/m2  SpO2 100%  LMP 07/23/2011  Post vital signs: Reviewed  Level of consciousness: awake  Complications: No apparent anesthesia complications

## 2012-05-04 NOTE — Progress Notes (Signed)
MD requested neonatologist to assess baby.  Nicu called Dr. Ellan Lambert at bedside @0547 .  Assessed baby. Orders given for baby to stay with mom.

## 2012-05-04 NOTE — Progress Notes (Signed)
Jade Palmer is a 22 y.o. G1P0 at [redacted]w[redacted]d  admitted for induction of labor due to Pre-eclamptic toxemia of pregnancy.  Subjective: Feeling comfortable. No headache or vision changes at this time    Objective: BP 129/73  Pulse 69  Temp(Src) 98 F (36.7 C) (Oral)  Resp 18  Ht 5' 6.5" (1.689 m)  Wt 103.42 kg (228 lb)  BMI 36.25 kg/m2  SpO2 97%  LMP 07/23/2011 Total I/O In: 947.3 [P.O.:222; I.V.:325.3; IV Piggyback:400] Out: 350 [Urine:350]  FHT:  FHR: 120-130 bpm, variability: moderate,  accelerations:  Present,  decelerations:  Present occasional variables UC:   Regular 1-2 minutes SVE:   Dilation: 5 Effacement (%): 90 Station: -2 Exam by:: [place] Kirchner; unchanged by Dr. Fara Boros MgSO4 @ 2 gm/hr   Labs: Lab Results  Component Value Date   WBC 9.4 05/03/2012   HGB 8.9* 05/03/2012   HCT 28.0* 05/03/2012   MCV 88.6 05/03/2012   PLT 144* 05/03/2012    Assessment / Plan: IOL for Preeclampsia -- continue pitocin, now s/p AROM; moving toward active labor  Labor: AROM'ed with copious clear fluid; IUPC placed to monitor ctx, adjust pit accordingly  Fetal Wellbeing:  Category I Pain Control:  Epidural Anticipated MOD:  NSVD  Jade Palmer 05/04/2012, 12:06 AM

## 2012-05-05 MED ORDER — RHO D IMMUNE GLOBULIN 1500 UNIT/2ML IJ SOLN
300.0000 ug | Freq: Once | INTRAMUSCULAR | Status: AC
  Administered 2012-05-05: 300 ug via INTRAVENOUS
  Filled 2012-05-05: qty 2

## 2012-05-05 NOTE — Progress Notes (Signed)
Post Partum Day 1 s/p SVD, had sever range GHTN Subjective: no complaints, up ad lib, voiding, tolerating PO and + flatus Magnesium turned off, patient to go to floor around 1300  Objective: Blood pressure 132/74, pulse 79, temperature 98.2 F (36.8 C), temperature source Oral, resp. rate 16, height 5\' 7"  (1.702 m), weight 227 lb 4.8 oz (103.103 kg), last menstrual period 07/23/2011, SpO2 100.00%, unknown if currently breastfeeding. Temp:  [97.9 F (36.6 C)-98.5 F (36.9 C)] 98.2 F (36.8 C) (03/29 0800) Pulse Rate:  [62-91] 79 (03/29 0900) Resp:  [16-20] 16 (03/29 0900) BP: (115-147)/(68-93) 132/74 mmHg (03/29 0900) SpO2:  [96 %-100 %] 100 % (03/29 0900) Weight:  [227 lb 4.8 oz (103.103 kg)] 227 lb 4.8 oz (103.103 kg) (03/29 0600)  Physical Exam:  General: alert and no distress Lungs: CTAB Heart: RRR Lochia: appropriate Uterine Fundus: firm Ext: 3+ DTR, 1+ edema BLE,  no evidence of DVT seen on physical exam.   Recent Labs  05/03/12 0950 05/03/12 2000  HGB 8.6* 8.9*  HCT 26.1* 28.0*  Rh neg  Assessment/Plan: Plan for discharge tomorrow, Breastfeeding and Contraception undecided No signs/symptoms of worsening GHTN, preeclampsia Transfer to Mother-Baby later Routine postpartum care   LOS: 5 days   Jade Palmer A 05/05/2012, 9:43 AM

## 2012-05-06 LAB — RH IG WORKUP (INCLUDES ABO/RH): Unit division: 0

## 2012-05-06 MED ORDER — ACETAMINOPHEN-CODEINE 300-30 MG PO TABS: 1.0000 | ORAL_TABLET | ORAL | Status: DC | PRN

## 2012-05-06 MED ORDER — IBUPROFEN 600 MG PO TABS: 600.0000 mg | ORAL_TABLET | Freq: Four times a day (QID) | ORAL | Status: DC

## 2012-05-06 MED ORDER — INTEGRA F 125-1 MG PO CAPS: 1.0000 | ORAL_CAPSULE | Freq: Every day | ORAL | Status: DC

## 2012-05-06 NOTE — Discharge Summary (Signed)
Obstetric Discharge Summary Reason for Admission: Pt was admitted on 04/30/12 for preeclampsia workup; labs were normal; pt proceeded to have severe range blood pressures and was transferred for IOL; pt induced with cytotec and later augmented with Pitocin.  Mag Sulfate was administered during the antenatal and intrapartum period.   Prenatal Procedures: NST, Preeclampsia and ultrasound Intrapartum Procedures: spontaneous vaginal delivery Postpartum Procedures: Rho(D) Ig Complications-Operative and Postpartum: 1 degree perineal laceration Hemoglobin  Date Value Range Status  05/03/2012 8.9* 12.0 - 15.0 g/dL Final     HCT  Date Value Range Status  05/03/2012 28.0* 36.0 - 46.0 % Final    Physical Exam:  General: alert, cooperative and appears stated age Lochia: appropriate Uterine Fundus: firm Incision: n/a DVT Evaluation: No evidence of DVT seen on physical exam. Negative Homan's sign. Filed Vitals:   05/06/12 0512  BP: 127/76  Pulse:   Temp:   Resp:    Filed Vitals:   05/05/12 1838 05/05/12 2315 05/06/12 0310 05/06/12 0512  BP: 129/80 130/75 143/93 127/76  Pulse: 79 70 67   Temp: 98.6 F (37 C) 98.9 F (37.2 C)    TempSrc: Oral Oral    Resp: 18 20 20    Height:      Weight:      SpO2:         Discharge Diagnoses: Term Pregnancy-delivered and Preelampsia; Anemia  Discharge Information: Date: 05/06/2012 Activity: pelvic rest Diet: routine Medications: Tylenol #3, Ibuprofen and Iron Condition: stable Instructions: refer to practice specific booklet Discharge to: home Follow-up Information   Follow up with [facility] In 4 weeks.   Contact information:   [address] [place] [address] [phone]      Newborn Data: Live born female  Birth Weight: 6 lb (2722 g) APGAR: 8, 9  Home with mother.  [facility] 05/06/2012, 6:58 AM

## 2012-05-07 LAB — TYPE AND SCREEN
ABO/RH(D): O NEG
Antibody Screen: POSITIVE
Unit division: 0

## 2012-05-09 NOTE — Discharge Summary (Signed)
Attestation of Attending Supervision of Advanced Practitioner (PA/CNM/NP): Evaluation and management procedures were performed by the Advanced Practitioner under my supervision and collaboration.  I have reviewed the Advanced Practitioner's note and chart, and I agree with the management and plan.  Niemah Schwebke, MD, FACOG Attending Obstetrician & Gynecologist Faculty Practice, [facility]   

## 2012-06-14 ENCOUNTER — Ambulatory Visit: Payer: Medicaid Other | Admitting: Advanced Practice Midwife

## 2012-06-18 ENCOUNTER — Ambulatory Visit (INDEPENDENT_AMBULATORY_CARE_PROVIDER_SITE_OTHER): Payer: Medicaid Other | Admitting: Women's Health

## 2012-06-18 ENCOUNTER — Encounter: Payer: Self-pay | Admitting: Women's Health

## 2012-06-18 VITALS — BP 140/80 | Ht 67.0 in | Wt 193.5 lb

## 2012-06-18 DIAGNOSIS — Z3480 Encounter for supervision of other normal pregnancy, unspecified trimester: Secondary | ICD-10-CM

## 2012-06-18 NOTE — Progress Notes (Signed)
Subjective:     Jade Palmer is a 22 y.o. female who presents for a postpartum visit. She is 6 weeks postpartum following a spontaneous vaginal delivery. I have fully reviewed the prenatal and intrapartum course. The delivery was at 35 gestational weeks.  She was admitted to hospital for pre-e evaluation and developed severe range bp's and was therefore induced. She received magnesium intra and postpartum. Outcome: spontaneous vaginal delivery. Anesthesia: epidural. Postpartum course has been uneventful. Baby's course has been uneventful. Baby is feeding by breast. Bleeding no bleeding. Bowel function is normal. Bladder function is normal. Patient is sexually active x 1- on 06/09/12. Contraception method is condoms. Requests more effective contraception, but would possibly like to have a baby in 2 years. Postpartum depression screening: negative. Denies ha, scotomata, ruq/epigastric pain, n/v.    The following portions of the patient's history were reviewed and updated as appropriate: allergies, current medications, past family history, past medical history, past social history, past surgical history and problem list.  Review of Systems Pertinent items are noted in HPI.   Objective:    BP 140/80  Ht 5\' 7"  (1.702 m)  Wt 193 lb 8 oz (87.771 kg)  BMI 30.3 kg/m2  Breastfeeding? Yes  BP recheck 118/70 General:  alert, cooperative and no distress   Breasts:  deferred              Vagina: normal vagina  Cervix:  no cervical motion tenderness  Corpus: well-involuted  Adnexa:  not evaluated  Rectal Exam: Not performed.      DTRs 2+, no clonus, no edema Assessment:   Normal postpartum exam Contraception counseling  Plan:  Discussed contraception options- desires Nexplanon, pamphlet given Return Thurs for bHCG am d/t sexual intercourse on 5/3, and Nexplanon insertion pm No sexual intercourse until after Nexplanon placed Continue prenatal vitamins while breastfeeding

## 2012-06-18 NOTE — Patient Instructions (Addendum)
No sex until after Nexplanon is placed Etonogestrel implant What is this medicine? ETONOGESTREL is a contraceptive (birth control) device. It is used to prevent pregnancy. It can be used for up to 3 years. This medicine may be used for other purposes; ask your health care provider or pharmacist if you have questions. What should I tell my health care provider before I take this medicine? They need to know if you have any of these conditions: -abnormal vaginal bleeding -blood vessel disease or blood clots -cancer of the breast, cervix, or liver -depression -diabetes -gallbladder disease -headaches -heart disease or recent heart attack -high blood pressure -high cholesterol -kidney disease -liver disease -renal disease -seizures -tobacco smoker -an unusual or allergic reaction to etonogestrel, other hormones, anesthetics or antiseptics, medicines, foods, dyes, or preservatives -pregnant or trying to get pregnant -breast-feeding How should I use this medicine? This device is inserted just under the skin on the inner side of your upper arm by a health care professional. Talk to your pediatrician regarding the use of this medicine in children. Special care may be needed. Overdosage: If you think you've taken too much of this medicine contact a poison control center or emergency room at once. Overdosage: If you think you have taken too much of this medicine contact a poison control center or emergency room at once. NOTE: This medicine is only for you. Do not share this medicine with others. What if I miss a dose? This does not apply. What may interact with this medicine? Do not take this medicine with any of the following medications: -amprenavir -bosentan -fosamprenavir This medicine may also interact with the following medications: -barbiturate medicines for inducing sleep or treating seizures -certain medicines for fungal infections like ketoconazole and  itraconazole -griseofulvin -medicines to treat seizures like carbamazepine, felbamate, oxcarbazepine, phenytoin, topiramate -modafinil -phenylbutazone -rifampin -some medicines to treat HIV infection like atazanavir, indinavir, lopinavir, nelfinavir, tipranavir, ritonavir -St. John's wort This list may not describe all possible interactions. Give your health care provider a list of all the medicines, herbs, non-prescription drugs, or dietary supplements you use. Also tell them if you smoke, drink alcohol, or use illegal drugs. Some items may interact with your medicine. What should I watch for while using this medicine? This product does not protect you against HIV infection (AIDS) or other sexually transmitted diseases. You should be able to feel the implant by pressing your fingertips over the skin where it was inserted. Tell your doctor if you cannot feel the implant. What side effects may I notice from receiving this medicine? Side effects that you should report to your doctor or health care professional as soon as possible: -allergic reactions like skin rash, itching or hives, swelling of the face, lips, or tongue -breast lumps -changes in vision -confusion, trouble speaking or understanding -dark urine -depressed mood -general ill feeling or flu-like symptoms -light-colored stools -loss of appetite, nausea -right upper belly pain -severe headaches -severe pain, swelling, or tenderness in the abdomen -shortness of breath, chest pain, swelling in a leg -signs of pregnancy -sudden numbness or weakness of the face, arm or leg -trouble walking, dizziness, loss of balance or coordination -unusual vaginal bleeding, discharge -unusually weak or tired -yellowing of the eyes or skin Side effects that usually do not require medical attention (Report these to your doctor or health care professional if they continue or are bothersome.): -acne -breast pain -changes in  weight -cough -fever or chills -headache -irregular menstrual bleeding -itching, burning, and vaginal discharge -pain  or difficulty passing urine -sore throat This list may not describe all possible side effects. Call your doctor for medical advice about side effects. You may report side effects to FDA at 1-800-FDA-1088. Where should I keep my medicine? This drug is given in a hospital or clinic and will not be stored at home. NOTE: This sheet is a summary. It may not cover all possible information. If you have questions about this medicine, talk to your doctor, pharmacist, or health care provider.  2013, Elsevier/Gold Standard. (10/17/2008 3:54:17 PM)

## 2012-06-25 ENCOUNTER — Other Ambulatory Visit: Payer: Medicaid Other

## 2012-06-25 ENCOUNTER — Encounter: Payer: Medicaid Other | Admitting: Adult Health

## 2013-04-11 ENCOUNTER — Other Ambulatory Visit: Payer: Medicaid Other | Admitting: Adult Health

## 2013-04-12 ENCOUNTER — Ambulatory Visit (INDEPENDENT_AMBULATORY_CARE_PROVIDER_SITE_OTHER): Payer: No Typology Code available for payment source | Admitting: Adult Health

## 2013-04-12 ENCOUNTER — Encounter: Payer: Self-pay | Admitting: Adult Health

## 2013-04-12 ENCOUNTER — Other Ambulatory Visit (HOSPITAL_COMMUNITY)
Admission: RE | Admit: 2013-04-12 | Discharge: 2013-04-12 | Disposition: A | Discharge status: Home / Self Care | Payer: No Typology Code available for payment source | Source: Ambulatory Visit | Attending: Obstetrics and Gynecology | Admitting: Obstetrics and Gynecology

## 2013-04-12 ENCOUNTER — Encounter (INDEPENDENT_AMBULATORY_CARE_PROVIDER_SITE_OTHER): Payer: Self-pay

## 2013-04-12 VITALS — BP 120/80 | HR 74 | Ht 66.0 in | Wt 182.5 lb

## 2013-04-12 DIAGNOSIS — Z01419 Encounter for gynecological examination (general) (routine) without abnormal findings: Secondary | ICD-10-CM | POA: Insufficient documentation

## 2013-04-12 DIAGNOSIS — R300 Dysuria: Secondary | ICD-10-CM

## 2013-04-12 DIAGNOSIS — Z3009 Encounter for other general counseling and advice on contraception: Secondary | ICD-10-CM

## 2013-04-12 DIAGNOSIS — Z3202 Encounter for pregnancy test, result negative: Secondary | ICD-10-CM

## 2013-04-12 HISTORY — DX: Encounter for other general counseling and advice on contraception: Z30.09

## 2013-04-12 LAB — POCT URINALYSIS DIPSTICK
Blood, UA: NEGATIVE
GLUCOSE UA: NEGATIVE
KETONES UA: NEGATIVE
LEUKOCYTES UA: NEGATIVE
Nitrite, UA: NEGATIVE
PROTEIN UA: NEGATIVE

## 2013-04-12 LAB — POCT URINE PREGNANCY: PREG TEST UR: NEGATIVE

## 2013-04-12 NOTE — Progress Notes (Signed)
Patient ID: Jade Palmer, female   DOB: 1991-01-30, 23 y.o.   MRN: [id number] History of Present Illness: Jade Palmer 23 year old white female, married in for pap and physical.She complains of having recent UTI and kidney stone, and was treated at [place] in [place].And she wants to discuss birth control, thinks she wants Nexplanon.   Current Medications, Allergies, Past Medical History, Past Surgical History, Family History and Social History were reviewed in [facility] electronic medical record.     Review of Systems: Patient denies any headaches, blurred vision, shortness of breath, chest pain, abdominal pain, problems with bowel movements, urination, or intercourse. No joint pain or mood swings.    Physical Exam:BP 120/80  Pulse 74  Ht 5\' 6"  (1.676 m)  Wt 182 lb 8 oz (82.781 kg)  BMI 29.47 kg/m2  LMP 02/20/2015urine negative, UPT negative General:  Well developed, well nourished, no acute distress Skin:  Warm and dry Neck:  Midline trachea, normal thyroid Lungs; Clear to auscultation bilaterally Breast:  No dominant palpable mass, retraction, or nipple discharge Cardiovascular: Regular rate and rhythm Abdomen:  Soft, non tender, no hepatosplenomegaly Pelvic:  External genitalia is normal in appearance.  The vagina is normal in appearance.  The cervix is bulbous. Pap performed. Uterus is felt to be normal size, shape, and contour.  No  adnexal masses or tenderness noted. Extremities:  No swelling or varicosities noted Psych:  No mood changes, alert and cooperative, seems happy but is having issues with husband Discussed nexpalnon  Impression: Yearly gyn exa Contraceptive counseling Burning with urination at times    Plan: Physical in 1 year Review handout on nexplanon Call with menses for nexplanon insertion

## 2013-04-12 NOTE — Patient Instructions (Signed)
Etonogestrel implant What is this medicine? ETONOGESTREL (et oh noe JES trel) is a contraceptive (birth control) device. It is used to prevent pregnancy. It can be used for up to 3 years. This medicine may be used for other purposes; ask your health care provider or pharmacist if you have questions. COMMON BRAND NAME(S): Implanon, Nexplanon  What should I tell my health care provider before I take this medicine? They need to know if you have any of these conditions: -abnormal vaginal bleeding -blood vessel disease or blood clots -cancer of the breast, cervix, or liver -depression -diabetes -gallbladder disease -headaches -heart disease or recent heart attack -high blood pressure -high cholesterol -kidney disease -liver disease -renal disease -seizures -tobacco smoker -an unusual or allergic reaction to etonogestrel, other hormones, anesthetics or antiseptics, medicines, foods, dyes, or preservatives -pregnant or trying to get pregnant -breast-feeding How should I use this medicine? This device is inserted just under the skin on the inner side of your upper arm by a health care professional. Talk to your pediatrician regarding the use of this medicine in children. Special care may be needed. Overdosage: If you think you've taken too much of this medicine contact a poison control center or emergency room at once. Overdosage: If you think you have taken too much of this medicine contact a poison control center or emergency room at once. NOTE: This medicine is only for you. Do not share this medicine with others. What if I miss a dose? This does not apply. What may interact with this medicine? Do not take this medicine with any of the following medications: -amprenavir -bosentan -fosamprenavir This medicine may also interact with the following medications: -barbiturate medicines for inducing sleep or treating seizures -certain medicines for fungal infections like ketoconazole and  itraconazole -griseofulvin -medicines to treat seizures like carbamazepine, felbamate, oxcarbazepine, phenytoin, topiramate -modafinil -phenylbutazone -rifampin -some medicines to treat HIV infection like atazanavir, indinavir, lopinavir, nelfinavir, tipranavir, ritonavir -St. John's wort This list may not describe all possible interactions. Give your health care provider a list of all the medicines, herbs, non-prescription drugs, or dietary supplements you use. Also tell them if you smoke, drink alcohol, or use illegal drugs. Some items may interact with your medicine. What should I watch for while using this medicine? This product does not protect you against HIV infection (AIDS) or other sexually transmitted diseases. You should be able to feel the implant by pressing your fingertips over the skin where it was inserted. Tell your doctor if you cannot feel the implant. What side effects may I notice from receiving this medicine? Side effects that you should report to your doctor or health care professional as soon as possible: -allergic reactions like skin rash, itching or hives, swelling of the face, lips, or tongue -breast lumps -changes in vision -confusion, trouble speaking or understanding -dark urine -depressed mood -general ill feeling or flu-like symptoms -light-colored stools -loss of appetite, nausea -right upper belly pain -severe headaches -severe pain, swelling, or tenderness in the abdomen -shortness of breath, chest pain, swelling in a leg -signs of pregnancy -sudden numbness or weakness of the face, arm or leg -trouble walking, dizziness, loss of balance or coordination -unusual vaginal bleeding, discharge -unusually weak or tired -yellowing of the eyes or skin Side effects that usually do not require medical attention (Report these to your doctor or health care professional if they continue or are bothersome.): -acne -breast pain -changes in  weight -cough -fever or chills -headache -irregular menstrual bleeding -itching, burning,   and vaginal discharge -pain or difficulty passing urine -sore throat This list may not describe all possible side effects. Call your doctor for medical advice about side effects. You may report side effects to FDA at 1-800-FDA-1088. Where should I keep my medicine? This drug is given in a hospital or clinic and will not be stored at home. NOTE: This sheet is a summary. It may not cover all possible information. If you have questions about this medicine, talk to your doctor, pharmacist, or health care provider.  2014, Elsevier/Gold Standard. (2011-08-01 15:37:45) Call with period for nexplanon Use condoms Physical in  1 year

## 2013-04-16 ENCOUNTER — Encounter (HOSPITAL_COMMUNITY): Payer: Self-pay

## 2013-04-19 ENCOUNTER — Encounter (HOSPITAL_COMMUNITY): Payer: Self-pay

## 2013-04-19 ENCOUNTER — Encounter (HOSPITAL_COMMUNITY)
Admission: RE | Admit: 2013-04-19 | Discharge: 2013-04-19 | Disposition: A | Discharge status: Home / Self Care | Payer: No Typology Code available for payment source | Source: Ambulatory Visit | Attending: Urology | Admitting: Urology

## 2013-04-19 DIAGNOSIS — Z01812 Encounter for preprocedural laboratory examination: Secondary | ICD-10-CM | POA: Insufficient documentation

## 2013-04-19 LAB — HCG, SERUM, QUALITATIVE: PREG SERUM: NEGATIVE

## 2013-04-19 LAB — HEMOGLOBIN AND HEMATOCRIT, BLOOD
HCT: 35.9 % — ABNORMAL LOW (ref 36.0–46.0)
HEMOGLOBIN: 11.9 g/dL — AB (ref 12.0–15.0)

## 2013-04-19 NOTE — Patient Instructions (Signed)
Fabio Beringshley M [name]  04/19/2013   Your procedure is scheduled on:  04/25/2013  [facility] at  820  AM.  Call this number if you have problems the morning of surgery: [phone]   Remember:   Do not eat food or drink liquids after midnight.   Take these medicines the morning of surgery with A SIP OF WATER:  none   Do not wear jewelry, make-up or nail polish.  Do not wear lotions, powders, or perfumes.   Do not shave 48 hours prior to surgery. Men may shave face and neck.  Do not bring valuables to the hospital.  [facility] is not responsible for any belongings or valuables.               Contacts, dentures or bridgework may not be worn into surgery.  Leave suitcase in the car. After surgery it may be brought to your room.  For patients admitted to the hospital, discharge time is determined by your treatment team.               Patients discharged the day of surgery will not be allowed to drive home.  Name and phone number of your driver: family  Special Instructions: Shower using CHG 2 nights before surgery and the night before surgery.  If you shower the day of surgery use CHG.  Use special wash - you have one bottle of CHG for all showers.  You should use approximately 1/3 of the bottle for each shower.   Please read over the following fact sheets that you were given: Pain Booklet, Coughing and Deep Breathing, Surgical Site Infection Prevention, Anesthesia Post-op Instructions and Care and Recovery After Surgery Cystoscopy Cystoscopy is a procedure that is used to help your caregiver diagnose and sometimes treat conditions that affect your lower urinary tract. Your lower urinary tract includes your bladder and the tube through which urine passes from your bladder out of your body (urethra). Cystoscopy is performed with a thin, tube-shaped instrument (cystoscope). The cystoscope has lenses and a light at the end so that your caregiver can see inside your bladder. The  cystoscope is inserted at the entrance of your urethra. Your caregiver guides it through your urethra and into your bladder. There are two main types of cystoscopy:  Flexible cystoscopy (with a flexible cystoscope).  Rigid cystoscopy (with a rigid cystoscope). Cystoscopy may be recommended for many conditions, including:  Urinary tract infections.  Blood in your urine (hematuria).  Loss of bladder control (urinary incontinence) or overactive bladder.  Unusual cells found in a urine sample.  Urinary blockage.  Painful urination. Cystoscopy may also be done to remove a sample of your tissue to be checked under a microscope (biopsy). It may also be done to remove or destroy bladder stones. LET YOUR CAREGIVER KNOW ABOUT:  Allergies to food or medicine.  Medicines taken, including vitamins, herbs, eyedrops, over-the-counter medicines, and creams.  Use of steroids (by mouth or creams).  Previous problems with anesthetics or numbing medicines.  History of bleeding problems or blood clots.  Previous surgery.  Other health problems, including diabetes and kidney problems.  Possibility of pregnancy, if this applies. PROCEDURE The area around the opening to your urethra will be cleaned. A medicine to numb your urethra (local anesthetic) is used. If a tissue sample or stone is removed during the procedure, you may be given a medicine to make you sleep (general anesthetic). Your caregiver will gently insert  the tip of the cystoscope into your urethra. The cystoscope will be slowly glided through your urethra and into your bladder. Sterile fluid will flow through the cystoscope and into your bladder. The fluid will expand and stretch your bladder. This gives your caregiver a better view of your bladder walls. The procedure lasts about 15 20 minutes. AFTER THE PROCEDURE If a local anesthetic is used, you will be allowed to go home as soon as you are ready. If a general anesthetic is used,  you will be taken to a recovery area until you are stable. You may have temporary bleeding and burning on urination. Document Released: 01/22/2000 Document Revised: 10/19/2011 Document Reviewed: 07/18/2011 [facility] Patient Information 2014 [place]. PATIENT INSTRUCTIONS POST-ANESTHESIA  IMMEDIATELY FOLLOWING SURGERY:  Do not drive or operate machinery for the first twenty four hours after surgery.  Do not make any important decisions for twenty four hours after surgery or while taking narcotic pain medications or sedatives.  If you develop intractable nausea and vomiting or a severe headache please notify your doctor immediately.  FOLLOW-UP:  Please make an appointment with your surgeon as instructed. You do not need to follow up with anesthesia unless specifically instructed to do so.  WOUND CARE INSTRUCTIONS (if applicable):  Keep a dry clean dressing on the anesthesia/puncture wound site if there is drainage.  Once the wound has quit draining you may leave it open to air.  Generally you should leave the bandage intact for twenty four hours unless there is drainage.  If the epidural site drains for more than 36-48 hours please call the anesthesia department.  QUESTIONS?:  Please feel free to call your physician or the hospital operator if you have any questions, and they will be happy to assist you.

## 2013-04-19 NOTE — Pre-Procedure Instructions (Signed)
Patient given information to set up mychart at home 

## 2013-04-22 ENCOUNTER — Ambulatory Visit (INDEPENDENT_AMBULATORY_CARE_PROVIDER_SITE_OTHER): Payer: No Typology Code available for payment source | Admitting: Adult Health

## 2013-04-22 ENCOUNTER — Encounter: Payer: Self-pay | Admitting: Adult Health

## 2013-04-22 VITALS — BP 120/80 | Ht 66.0 in | Wt 182.0 lb

## 2013-04-22 DIAGNOSIS — Z3202 Encounter for pregnancy test, result negative: Secondary | ICD-10-CM

## 2013-04-22 DIAGNOSIS — Z3200 Encounter for pregnancy test, result unknown: Secondary | ICD-10-CM

## 2013-04-22 DIAGNOSIS — Z30017 Encounter for initial prescription of implantable subdermal contraceptive: Secondary | ICD-10-CM | POA: Insufficient documentation

## 2013-04-22 LAB — POCT URINE PREGNANCY: PREG TEST UR: NEGATIVE

## 2013-04-22 NOTE — Patient Instructions (Signed)
Use condoms x 2-4  weeks, keep clean and dry x 24 hours, no heavy lifting, keep steri strips on x 72 hours, Keep pressure dressing on x 24 hours. Follow up prn problems. 

## 2013-04-22 NOTE — Progress Notes (Signed)
Subjective:     Patient ID: Jade Palmer, female   DOB: 1990-07-30, 23 y.o.   MRN: [id number]  HPI Jade Palmer is back for nexplanon insertion,LMP 04/17/13, last sex before last period.  Review of Systems See HPI Reviewed past medical,surgical, social and family history. Reviewed medications and allergies.     Objective:   Physical Exam BP 120/80  Ht 5\' 6"  (1.676 m)  Wt 182 lb (82.555 kg)  BMI 29.39 kg/m2  LMP 04/17/2013  Breastfeeding? NoUPT negative,Consent signed,time out called, Left arm cleansed with betadine, and injected with 1.5 cc 2% lidocaine and waited til numb. Nexplanon easily inserted, easily palpated by pt and provider, and steri strips applied. Pressure dressing applied.    Assessment:     Nexplanon insertion, lot # [id number], exp 05/2015    Plan:     Use condoms x 2-4 weeks, keep clean and dry x 24 hours, no heavy lifting, keep steri strips on x 72 hours, Keep pressure dressing on x 24 hours. Follow up prn problems.

## 2013-04-24 NOTE — H&P (Signed)
[name], Jade Palmer                ACCOUNT NO.:  [id number]  MEDICAL RECORD NO.:  [id number]  LOCATION:  [facility]                         FACILITY:  [facility]  PHYSICIAN:  Ky BarbanMohammad I. Rhyse Skowron, M.D.DATE OF BIRTH:  11/18/1990  DATE OF ADMISSION:  04/15/2013 DATE OF DISCHARGE:  LH                             HISTORY & PHYSICAL   CHIEF COMPLAINT:  Right renal colic.  HISTORY OF PRESENT ILLNESS:  This patient who is a 23 year old came to see me in the office on April 15, 2013.  She says she is having pain in the right flank for the last 3 weeks on and off.  No nausea, vomiting, fever, chills, or gross hematuria.  [place] in [place] scan was done, it showed she has a 7-mm stone in the distal right ureter in the ureterovesical junction causing partial obstruction.  She was having nausea and vomiting on that day.  She denies having any surgery in the past.  No kidney stones in the past, so I have advised her that she undergo stone basket.  I also discussed about lithotripsy, but she wanted to have a stone basket done.  The procedure, risk, complication, especially ureteral perforation leading to open surgery was discussed in detail, and use of double-J stent was also discussed.  She understands and wanted me to go ahead and proceed. So, she is coming as outpatient tomorrow, will undergo stone basket and most likely, she will be able to go home the same day.  She is not having any fever or chills.  No gross hematuria.  PAST MEDICAL HISTORY:  Otherwise negative.  PAST SURGICAL HISTORY:  Never had any surgery.  REVIEW OF SYSTEMS:  Unremarkable.  PERSONAL HISTORY:  Does not smoke or drink.  PHYSICAL EXAMINATION:  GENERAL:  Moderately built female, not in acute distress.  Fully conscious, alert, oriented. VITAL SIGNS:  Blood pressure 117/81, temperature 98.2.  Urinalysis looks completely normal. CENTRAL NERVOUS SYSTEM:  No gross neurologic deficit. HEAD, NECK,  EYE, ENT:  Negative. CHEST:  Symmetrical.  Normal breath sounds. HEART:  Regular sinus rhythm. ABDOMEN:  Soft, flat.  Liver, spleen, kidneys not palpable.  No CVA tenderness. PELVIC:  Deferred. EXTREMITIES:  Normal.  IMPRESSION:  Distal right ureteral calculus.  PLAN:  Cystoscopy, retrograde pyelogram on the right side, ureteroscopic stone basket extraction, and insertion of double-J stent, Holmium laser lithotripsy.  She understands and wanted me to go ahead and proceed.     Ky BarbanMohammad I. Tauren Delbuono, M.D.     MIJ/MEDQ  D:  04/24/2013  T:  04/24/2013  Job:  [id number]

## 2013-04-25 ENCOUNTER — Encounter (HOSPITAL_COMMUNITY)
Admission: RE | Disposition: A | Discharge status: Home / Self Care | Payer: Self-pay | Source: Ambulatory Visit | Attending: Urology

## 2013-04-25 ENCOUNTER — Encounter (HOSPITAL_COMMUNITY): Payer: PRIVATE HEALTH INSURANCE | Admitting: Anesthesiology

## 2013-04-25 ENCOUNTER — Encounter (HOSPITAL_COMMUNITY): Payer: Self-pay | Admitting: *Deleted

## 2013-04-25 ENCOUNTER — Ambulatory Visit (HOSPITAL_COMMUNITY): Payer: PRIVATE HEALTH INSURANCE

## 2013-04-25 ENCOUNTER — Ambulatory Visit (HOSPITAL_COMMUNITY)
Admission: RE | Admit: 2013-04-25 | Discharge: 2013-04-25 | Disposition: A | Discharge status: Home / Self Care | Payer: PRIVATE HEALTH INSURANCE | Source: Ambulatory Visit | Attending: Urology | Admitting: Urology

## 2013-04-25 ENCOUNTER — Ambulatory Visit (HOSPITAL_COMMUNITY): Payer: PRIVATE HEALTH INSURANCE | Admitting: Anesthesiology

## 2013-04-25 DIAGNOSIS — Z87442 Personal history of urinary calculi: Secondary | ICD-10-CM | POA: Insufficient documentation

## 2013-04-25 HISTORY — PX: BALLOON DILATION: SHX5330

## 2013-04-25 HISTORY — PX: CYSTOSCOPY/RETROGRADE/URETEROSCOPY/STONE EXTRACTION WITH BASKET: SHX5317

## 2013-04-25 SURGERY — CYSTOSCOPY, WITH CALCULUS REMOVAL USING BASKET
Anesthesia: General | Site: Ureter | Laterality: Right

## 2013-04-25 MED ORDER — ARTIFICIAL TEARS OP OINT
TOPICAL_OINTMENT | OPHTHALMIC | Status: DC | PRN
  Administered 2013-04-25: 1 via OPHTHALMIC

## 2013-04-25 MED ORDER — LIDOCAINE HCL (CARDIAC) 20 MG/ML IV SOLN
INTRAVENOUS | Status: DC | PRN
  Administered 2013-04-25: 50 mg via INTRAVENOUS

## 2013-04-25 MED ORDER — FENTANYL CITRATE 0.05 MG/ML IJ SOLN
INTRAMUSCULAR | Status: AC
  Filled 2013-04-25: qty 5

## 2013-04-25 MED ORDER — FENTANYL CITRATE 0.05 MG/ML IJ SOLN
INTRAMUSCULAR | Status: DC | PRN
  Administered 2013-04-25: 100 ug via INTRAVENOUS
  Administered 2013-04-25 (×3): 50 ug via INTRAVENOUS

## 2013-04-25 MED ORDER — STERILE WATER FOR IRRIGATION IR SOLN
Status: DC | PRN
  Administered 2013-04-25: 1000 mL

## 2013-04-25 MED ORDER — LACTATED RINGERS IV SOLN
INTRAVENOUS | Status: DC
  Administered 2013-04-25: 09:00:00 via INTRAVENOUS

## 2013-04-25 MED ORDER — MIDAZOLAM HCL 2 MG/2ML IJ SOLN
INTRAMUSCULAR | Status: AC
  Filled 2013-04-25: qty 2

## 2013-04-25 MED ORDER — FENTANYL CITRATE 0.05 MG/ML IJ SOLN
INTRAMUSCULAR | Status: AC
  Filled 2013-04-25: qty 2

## 2013-04-25 MED ORDER — ONDANSETRON HCL 4 MG/2ML IJ SOLN
INTRAMUSCULAR | Status: AC
  Filled 2013-04-25: qty 2

## 2013-04-25 MED ORDER — PROPOFOL 10 MG/ML IV BOLUS
INTRAVENOUS | Status: AC
  Filled 2013-04-25: qty 20

## 2013-04-25 MED ORDER — MIDAZOLAM HCL 2 MG/2ML IJ SOLN
1.0000 mg | INTRAMUSCULAR | Status: DC | PRN
  Administered 2013-04-25: 2 mg via INTRAVENOUS

## 2013-04-25 MED ORDER — ARTIFICIAL TEARS OP OINT
TOPICAL_OINTMENT | OPHTHALMIC | Status: AC
  Filled 2013-04-25: qty 3.5

## 2013-04-25 MED ORDER — GLYCOPYRROLATE 0.2 MG/ML IJ SOLN
INTRAMUSCULAR | Status: AC
  Filled 2013-04-25: qty 1

## 2013-04-25 MED ORDER — FENTANYL CITRATE 0.05 MG/ML IJ SOLN: 25.0000 ug | INTRAMUSCULAR | Status: DC | PRN

## 2013-04-25 MED ORDER — LACTATED RINGERS IV SOLN
INTRAVENOUS | Status: DC | PRN
  Administered 2013-04-25 (×2): via INTRAVENOUS

## 2013-04-25 MED ORDER — LIDOCAINE HCL (PF) 1 % IJ SOLN
INTRAMUSCULAR | Status: AC
  Filled 2013-04-25: qty 5

## 2013-04-25 MED ORDER — FENTANYL CITRATE 0.05 MG/ML IJ SOLN
25.0000 ug | INTRAMUSCULAR | Status: AC
  Administered 2013-04-25 (×2): 25 ug via INTRAVENOUS

## 2013-04-25 MED ORDER — ONDANSETRON HCL 4 MG/2ML IJ SOLN
4.0000 mg | Freq: Once | INTRAMUSCULAR | Status: AC
  Administered 2013-04-25: 4 mg via INTRAVENOUS

## 2013-04-25 MED ORDER — SODIUM CHLORIDE 0.9 % IR SOLN
Status: DC | PRN
  Administered 2013-04-25 (×2): 3000 mL

## 2013-04-25 MED ORDER — IOHEXOL 350 MG/ML SOLN
INTRAVENOUS | Status: DC | PRN
  Administered 2013-04-25: 50 mL

## 2013-04-25 MED ORDER — ONDANSETRON HCL 4 MG/2ML IJ SOLN
4.0000 mg | Freq: Once | INTRAMUSCULAR | Status: AC | PRN
  Administered 2013-04-25: 4 mg via INTRAVENOUS

## 2013-04-25 MED ORDER — GLYCOPYRROLATE 0.2 MG/ML IJ SOLN
0.2000 mg | Freq: Once | INTRAMUSCULAR | Status: AC
  Administered 2013-04-25: 0.2 mg via INTRAVENOUS

## 2013-04-25 MED ORDER — PROPOFOL 10 MG/ML IV BOLUS
INTRAVENOUS | Status: DC | PRN
  Administered 2013-04-25: 150 mg via INTRAVENOUS

## 2013-04-25 SURGICAL SUPPLY — 26 items
BAG DRAIN URO TABLE W/ADPT NS (DRAPE) ×4 IMPLANT
CATH 5 FR WEDGE TIP (UROLOGICAL SUPPLIES) ×4 IMPLANT
CATH OPEN TIP 5FR (CATHETERS) ×4 IMPLANT
CLOTH BEACON ORANGE TIMEOUT ST (SAFETY) ×4 IMPLANT
DILATOR UROMAX ULTRA (MISCELLANEOUS) ×4 IMPLANT
GLOVE BIO SURGEON STRL SZ7 (GLOVE) ×4 IMPLANT
GLOVE BIOGEL PI IND STRL 7.0 (GLOVE) ×4 IMPLANT
GLOVE BIOGEL PI IND STRL 8.5 (GLOVE) ×2 IMPLANT
GLOVE BIOGEL PI INDICATOR 7.0 (GLOVE) ×4
GLOVE BIOGEL PI INDICATOR 8.5 (GLOVE) ×2
GLOVE ECLIPSE 6.5 STRL STRAW (GLOVE) ×4 IMPLANT
GLOVE ECLIPSE 8.5 STRL (GLOVE) ×8 IMPLANT
GOWN STRL REUS W/TWL LRG LVL3 (GOWN DISPOSABLE) ×4 IMPLANT
IV NS IRRIG 3000ML ARTHROMATIC (IV SOLUTION) ×8 IMPLANT
KIT ROOM TURNOVER AP CYSTO (KITS) ×4 IMPLANT
LASER FIBER DISP (UROLOGICAL SUPPLIES) IMPLANT
LASER FIBER DISP 1000U (UROLOGICAL SUPPLIES) IMPLANT
MANIFOLD NEPTUNE II (INSTRUMENTS) ×4 IMPLANT
PACK CYSTO (CUSTOM PROCEDURE TRAY) ×4 IMPLANT
PAD ARMBOARD 7.5X6 YLW CONV (MISCELLANEOUS) ×4 IMPLANT
SET IRRIGATING DISP (SET/KITS/TRAYS/PACK) ×4 IMPLANT
STENT PERCUFLEX 4.8FRX24 (STENTS) IMPLANT
STONE RETRIEVAL GEMINI 2.4 FR (MISCELLANEOUS) IMPLANT
TOWEL OR 17X26 4PK STRL BLUE (TOWEL DISPOSABLE) ×4 IMPLANT
WATER STERILE IRR 1000ML POUR (IV SOLUTION) ×4 IMPLANT
WIRE GUIDE BENTSON .035 15CM (WIRE) ×4 IMPLANT

## 2013-04-25 NOTE — Discharge Instructions (Signed)
Call if fever or pain

## 2013-04-25 NOTE — Transfer of Care (Signed)
Immediate Anesthesia Transfer of Care Note  Patient: Jade Palmer  Procedure(s) Performed: Procedure(s): CYSTOSCOPY/RIGHT RETROGRADE/RIGHT URETEROSCOPY/BASKET (Right) RIGHT URETER BALLOON DILATION (Right)  Patient Location: PACU  Anesthesia Type:General  Level of Consciousness: sedated and patient cooperative  Airway & Oxygen Therapy: Patient Spontanous Breathing and Patient connected to face mask oxygen  Post-op Assessment: Report given to PACU RN and Post -op Vital signs reviewed and stable  Post vital signs: Reviewed and stable  Complications: No apparent anesthesia complications

## 2013-04-25 NOTE — Op Note (Signed)
[name], Jade                ACCOUNT NO.:  [id number]  MEDICAL RECORD NO.:  [id number]  LOCATION:  [facility]                          FACILITY:  [facility]  PHYSICIAN:  Ky BarbanMohammad I. Ricki Clack, M.D.DATE OF BIRTH:  [birth year]  DATE OF PROCEDURE:  04/25/2013 DATE OF DISCHARGE:                              OPERATIVE REPORT   PREOPERATIVE DIAGNOSIS:  Right distal ureteral calculus.  POSTOPERATIVE DIAGNOSIS:  No stone.  PROCEDURES:  Cystoscopy, right retrograde pyelogram, right ureteroscopy.  ANESTHESIA:  General.  DESCRIPTION OF PROCEDURE:  The patient under general endotracheal anesthesia in lithotomy position.  After usual prep and drape, #25 cystoscope introduced into the bladder.  It was inspected and looks normal.  No stone in the bladder.  Right ureteral orifice looks normal. It was catheterized with a wedge catheter.  Hypaque injected under fluoroscopic control.  Dye goes up into the upper ureter.  There appears to be a filling defect, but it was air bubble.  Then, there was another questionable filling defect, so I decided to look into the ureter.  A guidewire was passed up into the renal pelvis and over the guidewire, balloon dilator #15 was introduced in the ureter.  Intramural ureter was dilated to 15 size and then balloon was removed leaving the guidewire in place, and the short rigid ureteroscope was introduced alongside the guidewire, went to the level of the connection of the distal ureter with the mid ureter.  I do not see any stone in this entire length.  The basket was passed up into the upper ureter 2-3 times and retrieved and open.  I did not get any stone, so there is no stone in the ureter.  All the instruments were removed and cystoscope was removed.  The ureteroscope was removed and I also removed the guidewire, did not just put a double-J stent.  All the instruments were removed.  The patient left the operating room in satisfactory condition.     Ky BarbanMohammad I.  Ashten Sarnowski, M.D.     MIJ/MEDQ  D:  04/25/2013  T:  04/25/2013  Job:  [id number]

## 2013-04-25 NOTE — Anesthesia Preprocedure Evaluation (Signed)
Anesthesia Evaluation  Patient identified by MRN, date of birth, ID band Patient awake    Reviewed: Allergy & Precautions, H&P , NPO status , Patient's Chart, lab work & pertinent test results  Airway Mallampati: III TM Distance: >3 FB Neck ROM: Full    Dental no notable dental hx. (+) Teeth Intact   Pulmonary neg pulmonary ROS,  breath sounds clear to auscultation  Pulmonary exam normal       Cardiovascular hypertension (pregnacy only - off meds), Rhythm:Regular Rate:Normal  Mild precclampsia    Neuro/Psych Seizures -, Well Controlled,  negative psych ROS   GI/Hepatic negative GI ROS, Neg liver ROS,   Endo/Other  Obesity  Renal/GU Renal diseasenegative Renal ROS  negative genitourinary   Musculoskeletal negative musculoskeletal ROS (+)   Abdominal (+) + obese,   Peds  Hematology negative hematology ROS (+)   Anesthesia Other Findings   Reproductive/Obstetrics PTL 35 weeks                           Anesthesia Physical Anesthesia Plan  ASA: II  Anesthesia Plan: General   Post-op Pain Management:    Induction: Intravenous  Airway Management Planned: LMA  Additional Equipment:   Intra-op Plan:   Post-operative Plan: Extubation in OR  Informed Consent: I have reviewed the patients History and Physical, chart, labs and discussed the procedure including the risks, benefits and alternatives for the proposed anesthesia with the patient or authorized representative who has indicated his/her understanding and acceptance.     Plan Discussed with:   Anesthesia Plan Comments:         Anesthesia Quick Evaluation

## 2013-04-25 NOTE — Preoperative (Signed)
Beta Blockers   Reason not to administer Beta Blockers:Not Applicable 

## 2013-04-25 NOTE — Anesthesia Postprocedure Evaluation (Signed)
  Anesthesia Post-op Note  Patient: Jade Palmer  Procedure(s) Performed: Procedure(s): CYSTOSCOPY/RIGHT RETROGRADE/RIGHT URETEROSCOPY/BASKET (Right) RIGHT URETER BALLOON DILATION (Right)  Patient Location: PACU  Anesthesia Type:General  Level of Consciousness: sedated and patient cooperative  Airway and Oxygen Therapy: Patient Spontanous Breathing and Patient connected to face mask oxygen  Post-op Pain: none  Post-op Assessment: Post-op Vital signs reviewed, Patient's Cardiovascular Status Stable, Respiratory Function Stable, Patent Airway, No signs of Nausea or vomiting and Pain level controlled  Post-op Vital Signs: Reviewed and stable  Complications: No apparent anesthesia complications

## 2013-04-25 NOTE — Brief Op Note (Signed)
04/25/2013  10:11 AM  PATIENT:  Jade BeringAshley M Palmer  23 y.o. female  PRE-OPERATIVE DIAGNOSIS:  right ureteral calculus  POST-OPERATIVE DIAGNOSIS:  no stone seen  PROCEDURE:  Procedure(s): CYSTOSCOPY/RIGHT RETROGRADE/RIGHT URETEROSCOPY/STONE EXTRACTION WITH BASKET (Right)  SURGEON:  Surgeon(s) and Role:    * Ky BarbanMohammad I Dorrian Doggett, MD - Primary  PHYSICIAN ASSISTANT:   ASSISTANTS: none   ANESTHESIA:   general  EBL:  Total I/O In: 1000 [I.V.:1000] Out: -   BLOOD ADMINISTERED:none  DRAINS: none   LOCAL MEDICATIONS USED:  NONE  SPECIMEN:  No Specimen  DISPOSITION OF SPECIMEN:  N/A  COUNTS:  YES  TOURNIQUET:  * No tourniquets in log *  DICTATION: .Other Dictation: Dictation Number dictation [id number]  PLAN OF CARE: Discharge to home after PACU  PATIENT DISPOSITION:  PACU - hemodynamically stable.   Delay start of Pharmacological VTE agent (>24hrs) due to surgical blood loss or risk of bleeding:

## 2013-04-25 NOTE — Progress Notes (Signed)
C/O nausea. No emesis. Med as noted.

## 2013-04-25 NOTE — Anesthesia Procedure Notes (Signed)
Procedure Name: LMA Insertion Date/Time: 04/25/2013 9:32 AM Performed by: Pernell DupreADAMS, Julieann Drummonds A Pre-anesthesia Checklist: Patient identified, Timeout performed, Emergency Drugs available, Suction available and Patient being monitored Patient Re-evaluated:Patient Re-evaluated prior to inductionOxygen Delivery Method: Circle system utilized Intubation Type: IV induction Ventilation: Mask ventilation without difficulty LMA: LMA inserted LMA Size: 4.0 Number of attempts: 1 Placement Confirmation: positive ETCO2 and breath sounds checked- equal and bilateral Tube secured with: Tape Dental Injury: Teeth and Oropharynx as per pre-operative assessment

## 2013-04-25 NOTE — Progress Notes (Signed)
No pain since last time but has not seen any stone coming outi told her i will do xrays and after isee the stone will go after the stone no change in H&P on reexamination.

## 2013-04-26 ENCOUNTER — Encounter (HOSPITAL_COMMUNITY): Payer: Self-pay | Admitting: Urology

## 2013-12-09 ENCOUNTER — Encounter (HOSPITAL_COMMUNITY): Payer: Self-pay | Admitting: Urology

## 2014-07-23 ENCOUNTER — Encounter: Payer: Self-pay | Admitting: Obstetrics & Gynecology

## 2014-07-23 ENCOUNTER — Other Ambulatory Visit (HOSPITAL_COMMUNITY)
Admission: RE | Admit: 2014-07-23 | Discharge: 2014-07-23 | Disposition: A | Discharge status: Home / Self Care | Payer: No Typology Code available for payment source | Source: Ambulatory Visit | Attending: Obstetrics & Gynecology | Admitting: Obstetrics & Gynecology

## 2014-07-23 ENCOUNTER — Ambulatory Visit (INDEPENDENT_AMBULATORY_CARE_PROVIDER_SITE_OTHER): Payer: No Typology Code available for payment source | Admitting: Obstetrics & Gynecology

## 2014-07-23 VITALS — BP 120/80 | HR 76 | Ht 67.0 in | Wt 197.4 lb

## 2014-07-23 DIAGNOSIS — Z01411 Encounter for gynecological examination (general) (routine) with abnormal findings: Secondary | ICD-10-CM | POA: Diagnosis present

## 2014-07-23 DIAGNOSIS — Z01419 Encounter for gynecological examination (general) (routine) without abnormal findings: Secondary | ICD-10-CM | POA: Diagnosis not present

## 2014-07-23 DIAGNOSIS — Z1159 Encounter for screening for other viral diseases: Secondary | ICD-10-CM

## 2014-07-23 MED ORDER — ESTRADIOL 2 MG PO TABS: 2.0000 mg | ORAL_TABLET | Freq: Every day | ORAL | Status: DC

## 2014-07-23 NOTE — Addendum Note (Signed)
Addended by: Richardson Chiquito on: 07/23/2014 04:04 PM   Modules accepted: Orders

## 2014-07-23 NOTE — Progress Notes (Signed)
Patient ID: Jade Palmer, female   DOB: 05-22-1990, 24 y.o.   MRN: [id number] Subjective:     Jade Palmer is a 24 y.o. female here for a routine exam.  Patient's last menstrual period was 07/20/2014. G1P0101 Birth Control Method:  nexplanon Menstrual Calendar(currently): irregular spotting  Current complaints: spotting.   Current acute medical issues:  none   Recent Gynecologic History Patient's last menstrual period was 07/20/2014. Last Pap: 2014,  normal Last mammogram: ,    Past Medical History  Diagnosis Date  . History of kidney stones   . Chronic kidney disease     kidney stone  . Contraceptive education 04/12/2013  . Seizures     had seizure at age 45. Unknown etiology- no meds for this in about 5 years    Past Surgical History  Procedure Laterality Date  . Tonsillectomy    . Cystoscopy/retrograde/ureteroscopy/stone extraction with basket Right 04/25/2013    Procedure: CYSTOSCOPY/RIGHT RETROGRADE/RIGHT URETEROSCOPY/BASKET;  Surgeon: Ky Barban, MD;  Location: [facility];  Service: Urology;  Laterality: Right;  . Balloon dilation Right 04/25/2013    Procedure: RIGHT URETER BALLOON DILATION;  Surgeon: Ky Barban, MD;  Location: [facility];  Service: Urology;  Laterality: Right;    OB History    Gravida Para Term Preterm AB TAB SAB Ectopic Multiple Living   History   Social History  . Marital Status: Married    Spouse Name: N/A  . Number of Children: N/A  . Years of Education: N/A   Social History Main Topics  . Smoking status: Never Smoker   . Smokeless tobacco: Never Used  . Alcohol Use: Yes     Comment: rarely  . Drug Use: No  . Sexual Activity: Yes    Birth Control/ Protection: Condom   Other Topics Concern  . None   Social History Narrative    Family History  Problem Relation Age of Onset  . Hyperlipidemia Mother   . Hypertension Father   . Cancer Maternal Grandmother     lung  . Cancer Maternal Grandfather    bone  . Diabetes Paternal Grandfather   . Hypertension Paternal Grandmother      Current outpatient prescriptions:  .  etonogestrel (NEXPLANON) 68 MG IMPL implant, Inject 1 each into the skin once., Disp: , Rfl:   Review of Systems  Review of Systems  Constitutional: Negative for fever, chills, weight loss, malaise/fatigue and diaphoresis.  HENT: Negative for hearing loss, ear pain, nosebleeds, congestion, sore throat, neck pain, tinnitus and ear discharge.   Eyes: Negative for blurred vision, double vision, photophobia, pain, discharge and redness.  Respiratory: Negative for cough, hemoptysis, sputum production, shortness of breath, wheezing and stridor.   Cardiovascular: Negative for chest pain, palpitations, orthopnea, claudication, leg swelling and PND.  Gastrointestinal: negative for abdominal pain. Negative for heartburn, nausea, vomiting, diarrhea, constipation, blood in stool and melena.  Genitourinary: Negative for dysuria, urgency, frequency, hematuria and flank pain.  Musculoskeletal: Negative for myalgias, back pain, joint pain and falls.  Skin: Negative for itching and rash.  Neurological: Negative for dizziness, tingling, tremors, sensory change, speech change, focal weakness, seizures, loss of consciousness, weakness and headaches.  Endo/Heme/Allergies: Negative for environmental allergies and polydipsia. Does not bruise/bleed easily.  Psychiatric/Behavioral: Negative for depression, suicidal ideas, hallucinations, memory loss and substance abuse. The patient is not nervous/anxious and does not have insomnia.  Objective:  Blood pressure 120/80, pulse 76, height 5\' 7"  (1.702 m), weight 197 lb 6.4 oz (89.54 kg), last menstrual period 07/20/2014.   Physical Exam  Vitals reviewed. Constitutional: She is oriented to person, place, and time. She appears well-developed and well-nourished.  HENT:  Head: Normocephalic and atraumatic.        Right Ear: External ear  normal.  Left Ear: External ear normal.  Nose: Nose normal.  Mouth/Throat: Oropharynx is clear and moist.  Eyes: Conjunctivae and EOM are normal. Pupils are equal, round, and reactive to light. Right eye exhibits no discharge. Left eye exhibits no discharge. No scleral icterus.  Neck: Normal range of motion. Neck supple. No tracheal deviation present. No thyromegaly present.  Cardiovascular: Normal rate, regular rhythm, normal heart sounds and intact distal pulses.  Exam reveals no gallop and no friction rub.   No murmur heard. Respiratory: Effort normal and breath sounds normal. No respiratory distress. She has no wheezes. She has no rales. She exhibits no tenderness.  GI: Soft. Bowel sounds are normal. She exhibits no distension and no mass. There is no tenderness. There is no rebound and no guarding.  Genitourinary:  Breasts no masses skin changes or nipple changes bilaterally      Vulva is normal without lesions Vagina is pink moist without discharge Cervix normal in appearance and pap is done Uterus is normal size shape and contour Adnexa is negative with normal sized ovaries   Musculoskeletal: Normal range of motion. She exhibits no edema and no tenderness.  Neurological: She is alert and oriented to person, place, and time. She has normal reflexes. She displays normal reflexes. No cranial nerve deficit. She exhibits normal muscle tone. Coordination normal.  Skin: Skin is warm and dry. No rash noted. No erythema. No pallor.  Psychiatric: She has a normal mood and affect. Her behavior is normal. Judgment and thought content normal.       Assessment:    Healthy female exam.    Plan:    Contraception: Nexplanon. Follow up in: 1 year. estradiol 2 mg to try to stabilize the endometrium

## 2014-07-25 LAB — CYTOLOGY - PAP

## 2014-07-30 LAB — HEPATITIS B SURFACE ANTIBODY,QUALITATIVE: Hep B Surface Ab, Qual: NONREACTIVE

## 2016-04-20 ENCOUNTER — Encounter: Payer: Self-pay | Admitting: Obstetrics and Gynecology

## 2016-04-20 ENCOUNTER — Other Ambulatory Visit: Payer: Self-pay | Admitting: Obstetrics and Gynecology

## 2016-04-20 ENCOUNTER — Ambulatory Visit (INDEPENDENT_AMBULATORY_CARE_PROVIDER_SITE_OTHER): Payer: BLUE CROSS/BLUE SHIELD | Admitting: Obstetrics and Gynecology

## 2016-04-20 VITALS — BP 115/76 | HR 78 | Ht 67.0 in | Wt 212.0 lb

## 2016-04-20 DIAGNOSIS — Z0000 Encounter for general adult medical examination without abnormal findings: Secondary | ICD-10-CM

## 2016-04-20 DIAGNOSIS — Z01419 Encounter for gynecological examination (general) (routine) without abnormal findings: Secondary | ICD-10-CM | POA: Diagnosis not present

## 2016-04-20 NOTE — Addendum Note (Signed)
Addended by: Brooke DareSICK, Essa Wenk L on: 04/20/2016 10:48 AM   Modules accepted: Orders

## 2016-04-20 NOTE — Progress Notes (Signed)
HPI:      [name] is a 26 y.o. G1P0101 who LMP was Patient's last menstrual period was 04/07/2016 (approximate).  Subjective:   She presents today for her annual examination.  She has no specific complaints. She has Nexplanon for birth control and it is about to expire. She would like to get a Mirena for her next for birth control. She has had Mirena and used it successfully for 5 years in the past.    Hx: The following portions of the patient's history were reviewed and updated as appropriate:              She  has a past medical history of Contraceptive education (04/12/2013); History of kidney stones; and Seizures (HCC). She  does not have any pertinent problems on file. She  has a past surgical history that includes Tonsillectomy; Cystoscopy/retrograde/ureteroscopy/stone extraction with basket (Right, 04/25/2013); and Balloon dilation (Right, 04/25/2013). Her family history includes Cancer in her maternal grandfather and maternal grandmother; Diabetes in her paternal grandfather; Hyperlipidemia in her mother; Hypertension in her father and paternal grandmother. She  reports that she has never smoked. She has never used smokeless tobacco. She reports that she drinks alcohol. She reports that she does not use drugs. Current Outpatient Prescriptions on File Prior to Visit  Medication Sig Dispense Refill  . etonogestrel (NEXPLANON) 68 MG IMPL implant Inject 1 each into the skin once.    Marland Kitchen estradiol (ESTRACE) 2 MG tablet Take 1 tablet (2 mg total) by mouth daily. (Patient not taking: Reported on 04/20/2016) 30 tablet 11   No current facility-administered medications on file prior to visit.          Review of Systems:  Review of Systems  Constitutional: Denied constitutional symptoms, night sweats, recent illness, fatigue, fever, insomnia and weight loss.  Eyes: Denied eye symptoms, eye pain, photophobia, vision change and visual disturbance.  Ears/Nose/Throat/Neck: Denied ear, nose,  throat or neck symptoms, hearing loss, nasal discharge, sinus congestion and sore throat.  Cardiovascular: Denied cardiovascular symptoms, arrhythmia, chest pain/pressure, edema, exercise intolerance, orthopnea and palpitations.  Respiratory: Denied pulmonary symptoms, asthma, pleuritic pain, productive sputum, cough, dyspnea and wheezing.  Gastrointestinal: Denied, gastro-esophageal reflux, melena, nausea and vomiting.  Genitourinary: Denied genitourinary symptoms including symptomatic vaginal discharge, pelvic relaxation issues, and urinary complaints.  Musculoskeletal: Denied musculoskeletal symptoms, stiffness, swelling, muscle weakness and myalgia.  Dermatologic: Denied dermatology symptoms, rash and scar.  Neurologic: Denied neurology symptoms, dizziness, headache, neck pain and syncope.  Psychiatric: Denied psychiatric symptoms, anxiety and depression.  Endocrine: Denied endocrine symptoms including hot flashes and night sweats.   Meds:   Current Outpatient Prescriptions on File Prior to Visit  Medication Sig Dispense Refill  . etonogestrel (NEXPLANON) 68 MG IMPL implant Inject 1 each into the skin once.    Marland Kitchen estradiol (ESTRACE) 2 MG tablet Take 1 tablet (2 mg total) by mouth daily. (Patient not taking: Reported on 04/20/2016) 30 tablet 11   No current facility-administered medications on file prior to visit.     Objective:     Vitals:   04/20/16 1003  BP: 115/76  Pulse: 78              Physical examination General NAD, Conversant  HEENT Atraumatic; Op clear with mmm.  Normo-cephalic. Pupils reactive. Anicteric sclerae  Thyroid/Neck Smooth without nodularity or enlargement. Normal ROM.  Neck Supple.  Skin No rashes, lesions or ulceration. Normal palpated skin turgor. No nodularity.  Breasts: No masses or discharge.  Symmetric.  No axillary adenopathy.  Lungs: Clear to auscultation.No rales or wheezes. Normal Respiratory effort, no retractions.  Heart: NSR.  No murmurs or  rubs appreciated. No periferal edema  Abdomen: Soft.  Non-tender.  No masses.  No HSM. No hernia  Extremities: Moves all appropriately.  Normal ROM for age. No lymphadenopathy.  Neuro: Oriented to PPT.  Normal mood. Normal affect.     Pelvic:   Vulva: Normal appearance.  No lesions.  Vagina: No lesions or abnormalities noted.  Support: Normal pelvic support.  Urethra No masses tenderness or scarring.  Meatus Normal size without lesions or prolapse.  Cervix: Normal appearance.  No lesions.  Anus: Normal exam.  No lesions.  Perineum: Normal exam.  No lesions.        Bimanual   Uterus: Normal size.  Non-tender.  Mobile.  AV.  Adnexae: No masses.  Non-tender to palpation.  Cul-de-sac: Negative for abnormality.      Assessment:    G1P0101 Patient Active Problem List   Diagnosis Date Noted  . Nexplanon insertion 04/22/2013  . Contraceptive education 04/12/2013     1. Encounter for annual physical exam     Normal exam   Plan:            1.  Basic Screening Recommendations The basic screening recommendations for asymptomatic women were discussed with the patient during her visit.  The age-appropriate recommendations were discussed with her and the rational for the tests reviewed.  When I am informed by the patient that another primary care physician has previously obtained the age-appropriate tests and they are up-to-date, only outstanding tests are ordered and referrals given as necessary.  Abnormal results of tests will be discussed with her when all of her results are completed. Pap performed 2.  Patient to return for Nexplanon removal, IUD insertion.    F/U  Return in about 1 week (around 04/27/2016).  Elonda Huskyavid J. Evans, M.D. 04/20/2016 10:41 AM

## 2016-04-23 LAB — PAP IG, CT-NG, RFX HPV ASCU
Chlamydia, Nuc. Acid Amp: NEGATIVE
Gonococcus by Nucleic Acid Amp: NEGATIVE
PAP Smear Comment: 0

## 2016-04-25 ENCOUNTER — Telehealth: Payer: Self-pay

## 2016-04-25 NOTE — Telephone Encounter (Signed)
-----   Message from Linzie Collinavid James Evans, MD sent at 04/25/2016  8:27 AM EDT ----- Negative Pap and Negative GC/CT

## 2016-04-25 NOTE — Telephone Encounter (Signed)
Patient informed of negative results per provider 

## 2016-05-11 ENCOUNTER — Ambulatory Visit: Payer: BLUE CROSS/BLUE SHIELD | Admitting: Obstetrics and Gynecology

## 2016-05-18 ENCOUNTER — Ambulatory Visit (INDEPENDENT_AMBULATORY_CARE_PROVIDER_SITE_OTHER): Payer: BLUE CROSS/BLUE SHIELD | Admitting: Obstetrics and Gynecology

## 2016-05-18 ENCOUNTER — Encounter: Payer: Self-pay | Admitting: Obstetrics and Gynecology

## 2016-05-18 VITALS — BP 118/77 | HR 77 | Ht 66.0 in | Wt 211.1 lb

## 2016-05-18 DIAGNOSIS — Z308 Encounter for other contraceptive management: Secondary | ICD-10-CM | POA: Diagnosis not present

## 2016-05-18 DIAGNOSIS — Z3046 Encounter for surveillance of implantable subdermal contraceptive: Secondary | ICD-10-CM | POA: Diagnosis not present

## 2016-05-18 MED ORDER — NORETHIN-ETH ESTRAD-FE BIPHAS 1 MG-10 MCG / 10 MCG PO TABS: 1.0000 | ORAL_TABLET | Freq: Every day | ORAL | 11 refills | Status: DC

## 2016-05-18 NOTE — Patient Instructions (Signed)
1. Nexplanon is removed 2. Patient is to start Loestrin oral contraceptives 3. Patient is to return in 3 months for follow-up on birth control pill use-Dr. Logan Bores

## 2016-05-18 NOTE — Progress Notes (Signed)
Chief complaint: 1. Nexplanon removal 2. Contraceptive management  Patient presents today for her Nexplanon removal.  Patient had questions regarding future contraception. She did use the Mirena IUD in the past but had weight gain associated with it. She is interested in birth control pills but would not like to have any associated weight gain.. We discussed the pros and cons of various options and have agreed to proceed with a 10 g birth control pill.  Past Medical History:  Diagnosis Date  . Contraceptive education 04/12/2013  . History of kidney stones   . Seizures (HCC)    had seizure at age 89. Unknown etiology- no meds for this in about 5 years   Past Surgical History:  Procedure Laterality Date  . BALLOON DILATION Right 04/25/2013   Procedure: RIGHT URETER BALLOON DILATION;  Surgeon: Ky Barban, MD;  Location: [facility];  Service: Urology;  Laterality: Right;  . CYSTOSCOPY/RETROGRADE/URETEROSCOPY/STONE EXTRACTION WITH BASKET Right 04/25/2013   Procedure: CYSTOSCOPY/RIGHT RETROGRADE/RIGHT URETEROSCOPY/BASKET;  Surgeon: Ky Barban, MD;  Location: [facility];  Service: Urology;  Laterality: Right;  . TONSILLECTOMY      OBJECTIVE: BP 118/77   Pulse 77   Ht  (1.676 m)   Wt 211 lb 1 oz (95.7 kg)   LMP 04/27/2016 (Approximate)   BMI 34.07 kg/m  Pleasant female in no acute distress. Alert and oriented.  Procedure: Nexplanon removal Verbal consent is obtained. The patient is prepped with Betadine with the arm elevated above her head. Local anesthetic 1% lidocaine without epinephrine is injected (3 cc). Stab incision is made with scalpel. Hemostat is used to grasp the end of the Nexplanon; scar tissue is freed up and Nexplanon is removed in standard fashion. The removal site is cleansed with saline. Steri-Strip was applied. 4 x 4 and tdermoplast is placed for hemostasis. Blood loss-minimal. Complications-none.  ASSESSMENT: 1. Successful removal of Nexplanon 2.  Contraceptive management  PLAN: 1. Nexplanon is removed as noted 2. Lo Loestrin oral contraceptive prescribed 3. Patient is to return in 3 months to see Dr. Logan Bores for follow-up  A total of 15 minutes were spent face-to-face with the patient during this encounter and over half of that time dealt with counseling and coordination of care.   Herold Harms, MD  Note: This dictation was prepared with Dragon dictation along with smaller phrase technology. Any transcriptional errors that result from this process are unintentional.

## 2016-05-25 ENCOUNTER — Telehealth: Payer: Self-pay

## 2016-05-25 NOTE — Telephone Encounter (Signed)
Pt states that Dr.De recently started her on Lo Loestrin after removing her Nexplanon however this is not covered by her insurance and she would like something else sent in. [employer].

## 2016-05-26 MED ORDER — NORETHIN ACE-ETH ESTRAD-FE 1-20 MG-MCG PO TABS: 1.0000 | ORAL_TABLET | Freq: Every day | ORAL | 11 refills | Status: DC

## 2016-05-26 NOTE — Telephone Encounter (Signed)
Jade Palmer at [facility] also left VM concerning a new BC script needed due to insurance. [id number]

## 2016-05-26 NOTE — Telephone Encounter (Signed)
Pt aware per vm junel 1/20 erx. Riki Rusk at [facility] also aware.

## 2016-10-03 ENCOUNTER — Ambulatory Visit (INDEPENDENT_AMBULATORY_CARE_PROVIDER_SITE_OTHER): Payer: BLUE CROSS/BLUE SHIELD | Admitting: Obstetrics and Gynecology

## 2016-10-03 ENCOUNTER — Encounter: Payer: Self-pay | Admitting: Obstetrics and Gynecology

## 2016-10-03 VITALS — BP 108/73 | HR 81 | Ht 66.0 in | Wt 211.1 lb

## 2016-10-03 DIAGNOSIS — N926 Irregular menstruation, unspecified: Secondary | ICD-10-CM

## 2016-10-03 DIAGNOSIS — O219 Vomiting of pregnancy, unspecified: Secondary | ICD-10-CM | POA: Diagnosis not present

## 2016-10-03 DIAGNOSIS — O09291 Supervision of pregnancy with other poor reproductive or obstetric history, first trimester: Secondary | ICD-10-CM | POA: Diagnosis not present

## 2016-10-03 LAB — POCT URINE PREGNANCY: Preg Test, Ur: POSITIVE — AB

## 2016-10-03 MED ORDER — DOXYLAMINE-PYRIDOXINE 10-10 MG PO TBEC: 4.0000 | DELAYED_RELEASE_TABLET | Freq: Every day | ORAL | 1 refills | Status: AC

## 2016-10-03 NOTE — Progress Notes (Signed)
HPI:      Jade Palmer is a 26 y.o. G2P0101 who LMP was No LMP recorded (lmp unknown). Patient is pregnant.  Subjective:   She presents today For pregnancy confirmation. She had her Nexplanon removed in April and has had only a few irregular periods since that time.  She began experiencing nausea and vomiting and "knew she was pregnant". She is currently taking prenatal vitamins. Her last pregnancy ended at 35 weeks and 6 days and was complicated by preeclampsia. She is having daily nausea and vomiting and she dealt with this during her last pregnancy as well.    Hx: The following portions of the patient's history were reviewed and updated as appropriate:             She  has a past medical history of Contraceptive education (04/12/2013); History of kidney stones; and Seizures (HCC). She  does not have any pertinent problems on file. She  has a past surgical history that includes Tonsillectomy; Cystoscopy/retrograde/ureteroscopy/stone extraction with basket (Right, 04/25/2013); and Balloon dilation (Right, 04/25/2013). Her family history includes Cancer in her maternal grandfather and maternal grandmother; Diabetes in her paternal grandfather; Hyperlipidemia in her mother; Hypertension in her father and paternal grandmother. She  reports that she has never smoked. She has never used smokeless tobacco. She reports that she does not drink alcohol or use drugs. She has No Known Allergies.       Review of Systems:  Review of Systems  Constitutional: Denied constitutional symptoms, night sweats, recent illness, fatigue, fever, insomnia and weight loss.  Eyes: Denied eye symptoms, eye pain, photophobia, vision change and visual disturbance.  Ears/Nose/Throat/Neck: Denied ear, nose, throat or neck symptoms, hearing loss, nasal discharge, sinus congestion and sore throat.  Cardiovascular: Denied cardiovascular symptoms, arrhythmia, chest pain/pressure, edema, exercise intolerance, orthopnea and  palpitations.  Respiratory: Denied pulmonary symptoms, asthma, pleuritic pain, productive sputum, cough, dyspnea and wheezing.  Gastrointestinal: Denied, gastro-esophageal reflux, melena, Has nausea and vomiting   Genitourinary: Denied genitourinary symptoms including symptomatic vaginal discharge, pelvic relaxation issues, and urinary complaints.  Musculoskeletal: Denied musculoskeletal symptoms, stiffness, swelling, muscle weakness and myalgia.  Dermatologic: Denied dermatology symptoms, rash and scar.  Neurologic: Denied neurology symptoms, dizziness, headache, neck pain and syncope.  Psychiatric: Denied psychiatric symptoms, anxiety and depression.  Endocrine: Denied endocrine symptoms including hot flashes and night sweats.   Meds:   Current Outpatient Prescriptions on File Prior to Visit  Medication Sig Dispense Refill  . buPROPion (WELLBUTRIN SR) 150 MG 12 hr tablet Take 150 mg by mouth 2 (two) times daily.    . busPIRone (BUSPAR) 10 MG tablet Take 10 mg by mouth 3 (three) times daily.    . norethindrone-ethinyl estradiol (JUNEL FE 1/20) 1-20 MG-MCG tablet Take 1 tablet by mouth daily. (Patient not taking: Reported on 10/03/2016) 1 Package 11   No current facility-administered medications on file prior to visit.     Objective:     Vitals:   10/03/16 1000  BP: 108/73  Pulse: 81              Urinary pregnancy test positive.  Assessment:    G2P0101 Patient Active Problem List   Diagnosis Date Noted  . Nexplanon insertion 04/22/2013  . Contraceptive education 04/12/2013     1. Missed menses   2. Nausea/vomiting in pregnancy   3. History of pre-eclampsia in prior pregnancy, currently pregnant in first trimester        Plan:  Prenatal Plan 1.  The patient was given prenatal literature. 2.  She was continued on prenatal vitamins. 3.  A prenatal lab panel was ordered or drawn. 4.  An ultrasound was ordered to better determine an EDC. 5.  A nurse visit  was scheduled. 6.  We have discussed preeclampsia and its recurrence and second pregnancies. I have advised her to begin daily baby aspirin aspirin at approximately 13 weeks. 7.  Prescription for diclegis given.  Orders Orders Placed This Encounter  Procedures  . US OB Comp Less 14 Wks  . US OB Transvaginal  . POCT urine pregnancy     Meds ordered this encounter  Medications  . Prenatal Vit-Fe Fumarate-FA (PRENATAL MULTIVITAMIN) TABS tablet    Sig: Take 1 tablet by mouth daily at 12 noon.  . Doxylamine-Pyridoxine (DICLEGIS) 10-10 MG TBEC    Sig: Take 4 tablets by mouth daily. Take one tablet in the morning and two tablets at bedtime for nausea    Dispense:  50 tablet    Refill:  1        F/U  No Follow-up on file. I spent 17 minutes with this patient of which greater than 50% was spent discussing pregnancy, nausea and vomiting, previous history of preeclampsia.  Elonda Husky, M.D. 10/03/2016 10:22 AM

## 2016-10-10 ENCOUNTER — Other Ambulatory Visit: Payer: BLUE CROSS/BLUE SHIELD

## 2016-10-14 ENCOUNTER — Other Ambulatory Visit: Payer: Self-pay | Admitting: Obstetrics and Gynecology

## 2016-10-14 ENCOUNTER — Ambulatory Visit (INDEPENDENT_AMBULATORY_CARE_PROVIDER_SITE_OTHER): Payer: BLUE CROSS/BLUE SHIELD

## 2016-10-14 DIAGNOSIS — N926 Irregular menstruation, unspecified: Secondary | ICD-10-CM

## 2016-10-21 ENCOUNTER — Encounter: Payer: BLUE CROSS/BLUE SHIELD | Admitting: Obstetrics and Gynecology

## 2016-10-24 ENCOUNTER — Ambulatory Visit (INDEPENDENT_AMBULATORY_CARE_PROVIDER_SITE_OTHER): Payer: 59 | Admitting: Certified Nurse Midwife

## 2016-10-24 ENCOUNTER — Encounter: Payer: Self-pay | Admitting: Certified Nurse Midwife

## 2016-10-24 VITALS — BP 133/86 | HR 85 | Wt 206.6 lb

## 2016-10-24 DIAGNOSIS — Z3481 Encounter for supervision of other normal pregnancy, first trimester: Secondary | ICD-10-CM | POA: Diagnosis not present

## 2016-10-24 DIAGNOSIS — O26891 Other specified pregnancy related conditions, first trimester: Secondary | ICD-10-CM

## 2016-10-24 DIAGNOSIS — O09291 Supervision of pregnancy with other poor reproductive or obstetric history, first trimester: Secondary | ICD-10-CM

## 2016-10-24 DIAGNOSIS — O219 Vomiting of pregnancy, unspecified: Secondary | ICD-10-CM

## 2016-10-24 DIAGNOSIS — O09299 Supervision of pregnancy with other poor reproductive or obstetric history, unspecified trimester: Secondary | ICD-10-CM | POA: Insufficient documentation

## 2016-10-24 DIAGNOSIS — O2341 Unspecified infection of urinary tract in pregnancy, first trimester: Secondary | ICD-10-CM

## 2016-10-24 DIAGNOSIS — R519 Headache, unspecified: Secondary | ICD-10-CM | POA: Insufficient documentation

## 2016-10-24 DIAGNOSIS — R946 Abnormal results of thyroid function studies: Secondary | ICD-10-CM | POA: Diagnosis not present

## 2016-10-24 DIAGNOSIS — Z3401 Encounter for supervision of normal first pregnancy, first trimester: Secondary | ICD-10-CM

## 2016-10-24 DIAGNOSIS — R51 Headache: Secondary | ICD-10-CM

## 2016-10-24 LAB — POCT URINALYSIS DIPSTICK
Bilirubin, UA: NEGATIVE
Blood, UA: NEGATIVE
Glucose, UA: NEGATIVE
KETONES UA: 15
NITRITE UA: POSITIVE
Spec Grav, UA: 1.01 (ref 1.010–1.025)
UROBILINOGEN UA: 0.2 U/dL
pH, UA: 7 (ref 5.0–8.0)

## 2016-10-24 MED ORDER — NITROFURANTOIN MONOHYD MACRO 100 MG PO CAPS: 100.0000 mg | ORAL_CAPSULE | Freq: Two times a day (BID) | ORAL | 1 refills | Status: DC

## 2016-10-24 MED ORDER — MAGNESIUM OXIDE -MG SUPPLEMENT 400 (240 MG) MG PO TABS: 1.0000 | ORAL_TABLET | Freq: Two times a day (BID) | ORAL | 2 refills | Status: DC

## 2016-10-24 MED ORDER — ONDANSETRON 4 MG PO TBDP: 4.0000 mg | ORAL_TABLET | Freq: Four times a day (QID) | ORAL | 0 refills | Status: DC | PRN

## 2016-10-24 MED ORDER — ASPIRIN EC 81 MG PO TBEC: 81.0000 mg | DELAYED_RELEASE_TABLET | Freq: Every day | ORAL | 2 refills | Status: DC

## 2016-10-24 NOTE — Progress Notes (Signed)
NEW OB HISTORY AND PHYSICAL  SUBJECTIVE:       Jade Palmer is a 26 y.o. G9P0101 female, Patient's last menstrual period was 08/04/2016., Estimated Date of Delivery: 05/11/17, [redacted]w[redacted]d, presents today for establishment of Prenatal Care.  Reports daily nausea and vomiting (relieved by [facility]), headaches, and UTI symptoms. Unsure about genetic screening, will check for insurance coverage.   Denies difficulty breathing or respiratory distress, chest pain, abdominal pain, vaginal bleeding, and leg pain or swelling.    Gynecologic History  Patient's last menstrual period was 08/04/2016.   Contraception: none  Last Pap: 07/28/2014. Results were: normal  Obstetric History  OB History  Gravida Para Term Preterm AB Living  SAB TAB Ectopic Multiple Live Births          1    # Outcome Date GA Lbr Len/2nd Weight Sex Delivery Anes PTL Lv  2 Current           1 Preterm 05/04/12 [redacted]w[redacted]d / 03:39 6 lb (2.722 kg) F Vag-Spont EPI  LIV      Past Medical History:  Diagnosis Date  . Contraceptive education 04/12/2013  . History of kidney stones   . Seizures (HCC)    had seizure at age 74. Unknown etiology- no meds     Past Surgical History:  Procedure Laterality Date  . BALLOON DILATION Right 04/25/2013   Procedure: RIGHT URETER BALLOON DILATION;  Surgeon: Ky Barban, MD;  Location: [facility];  Service: Urology;  Laterality: Right;  . CYSTOSCOPY/RETROGRADE/URETEROSCOPY/STONE EXTRACTION WITH BASKET Right 04/25/2013   Procedure: CYSTOSCOPY/RIGHT RETROGRADE/RIGHT URETEROSCOPY/BASKET;  Surgeon: Ky Barban, MD;  Location: [facility];  Service: Urology;  Laterality: Right;  . TONSILLECTOMY      Current Outpatient Prescriptions on File Prior to Visit  Medication Sig Dispense Refill  . Prenatal Vit-Fe Fumarate-FA (PRENATAL MULTIVITAMIN) TABS tablet Take 1 tablet by mouth daily at 12 noon.     No current facility-administered medications on file prior to visit.     No Known  Allergies  Social History   Social History  . Marital status: Legally Separated    Spouse name: N/A  . Number of children: N/A  . Years of education: N/A   Occupational History  . Not on file.   Social History Main Topics  . Smoking status: Never Smoker  . Smokeless tobacco: Never Used  . Alcohol use No     Comment: rarely  . Drug use: No  . Sexual activity: Yes    Birth control/ protection: None   Other Topics Concern  . Not on file   Social History Narrative  .  Employee, works as Charity fundraiser in [employer]    Family History  Problem Relation Age of Onset  . Hyperlipidemia Mother   . Hypertension Father   . Cancer Maternal Grandmother        lung  . Cancer Maternal Grandfather        bone  . Diabetes Paternal Grandfather   . Hypertension Paternal Grandmother     The following portions of the patient's history were reviewed and updated as appropriate: allergies, current medications, past OB history, past medical history, past surgical history, past family history, past social history, and problem list.  OBJECTIVE:  Initial Physical Exam (New OB)  GENERAL APPEARANCE: alert, well appearing, in no apparent distress  HEAD: normocephalic, atraumatic  MOUTH: mucous membranes moist, pharynx normal without lesions and dental hygiene good  THYROID:  no thyromegaly or masses present  BREASTS: deferred, no complaints  LUNGS: clear to auscultation, no wheezes, rales or rhonchi, symmetric air entry  HEART: regular rate and rhythm, no murmurs  ABDOMEN: soft, nontender, nondistended, no abnormal masses, no epigastric pain, obese, fundus not palpable and FHT present  EXTREMITIES: no redness or tenderness in the calves or thighs, no edema  SKIN: normal coloration and turgor, no rashes  LYMPH NODES: no adenopathy palpable  NEUROLOGIC: alert, oriented, normal speech, no focal findings or movement disorder noted  PELVIC EXAM: deferred, no complaints  ASSESSMENT: Normal  pregnancy Nausea and vomiting in pregnancy Headache in pregnancy Hx of preeclampsia in previous pregnancy UTI  PLAN: Prenatal care NOB lab work/PIH baseline labs today. Bonjesta samples given. Rx: Macrobid, Zofran, Magnesium oxide, and Aspirin.  New OB counseling: The patient has been given an overview regarding routine prenatal care. Recommendations regarding diet, weight gain, and exercise in pregnancy were given. Prenatal testing, optional genetic testing, and ultrasound use in pregnancy were reviewed.  Benefits of Breast Feeding were discussed. The patient is encouraged to consider nursing her baby post partum. Reviewed red flag symptoms and when to call. RTC x 4 weeks for ROB or sooner if needed See orders

## 2016-10-24 NOTE — Progress Notes (Signed)
NOB - pt is having nausea, states she has been having a headache, has some stress going on in life, has been using bonjesta with good results, will send in rx

## 2016-10-24 NOTE — Patient Instructions (Signed)
Eating Plan for Pregnant Women While you are pregnant, your body will require additional nutrition to help support your growing baby. It is recommended that you consume:  150 additional calories each day during your first trimester.  300 additional calories each day during your second trimester.  300 additional calories each day during your third trimester.  Eating a healthy, well-balanced diet is very important for your health and for your baby's health. You also have a higher need for some vitamins and minerals, such as folic acid, calcium, iron, and vitamin D. What do I need to know about eating during pregnancy?  Do not try to lose weight or go on a diet during pregnancy.  Choose healthy, nutritious foods. Choose  of a sandwich with a glass of milk instead of a candy bar or a high-calorie sugar-sweetened beverage.  Limit your overall intake of foods that have "empty calories." These are foods that have little nutritional value, such as sweets, desserts, candies, sugar-sweetened beverages, and fried foods.  Eat a variety of foods, especially fruits and vegetables.  Take a prenatal vitamin to help meet the additional needs during pregnancy, specifically for folic acid, iron, calcium, and vitamin D.  Remember to stay active. Ask your health care provider for exercise recommendations that are specific to you.  Practice good food safety and cleanliness, such as washing your hands before you eat and after you prepare raw meat. This helps to prevent foodborne illnesses, such as listeriosis, that can be very dangerous for your baby. Ask your health care provider for more information about listeriosis. What does 150 extra calories look like? Healthy options for an additional 150 calories each day could be any of the following:  Plain low-fat yogurt (6-8 oz) with  cup of berries.  1 apple with 2 teaspoons of peanut butter.  Cut-up vegetables with  cup of hummus.  Low-fat chocolate milk  (8 oz or 1 cup).  1 string cheese with 1 medium orange.   of a peanut butter and jelly sandwich on whole-wheat bread (1 tsp of peanut butter).  For 300 calories, you could eat two of those healthy options each day. What is a healthy amount of weight to gain? The recommended amount of weight for you to gain is based on your pre-pregnancy BMI. If your pre-pregnancy BMI was:  Less than 18 (underweight), you should gain 28-40 lb.  18-24.9 (normal), you should gain 25-35 lb.  25-29.9 (overweight), you should gain 15-25 lb.  Greater than 30 (obese), you should gain 11-20 lb.  What if I am having twins or multiples? Generally, pregnant women who will be having twins or multiples may need to increase their daily calories by 300-600 calories each day. The recommended range for total weight gain is 25-54 lb, depending on your pre-pregnancy BMI. Talk with your health care provider for specific guidance about additional nutritional needs, weight gain, and exercise during your pregnancy. What foods can I eat? Grains Any grains. Try to choose whole grains, such as whole-wheat bread, oatmeal, or brown rice. Vegetables Any vegetables. Try to eat a variety of colors and types of vegetables to get a full range of vitamins and minerals. Remember to wash your vegetables well before eating. Fruits Any fruits. Try to eat a variety of colors and types of fruit to get a full range of vitamins and minerals. Remember to wash your fruits well before eating. Meats and Other Protein Sources Lean meats, including chicken, [place], fish, and lean cuts of beef, veal,  or pork. Make sure that all meats are cooked to "well done." Tofu. Tempeh. Beans. Eggs. Peanut butter and other nut butters. Seafood, such as shrimp, crab, and lobster. If you choose fish, select types that are higher in omega-3 fatty acids, including salmon, herring, mussels, trout, sardines, and pollock. Make sure that all meats are cooked to food-safe  temperatures. Dairy Pasteurized milk and milk alternatives. Pasteurized yogurt and pasteurized cheese. Cottage cheese. Sour cream. Beverages Water. Juices that contain 100% fruit juice or vegetable juice. Caffeine-free teas and decaffeinated coffee. Drinks that contain caffeine are okay to drink, but it is better to avoid caffeine. Keep your total caffeine intake to less than 200 mg each day (12 oz of coffee, tea, or soda) or as directed by your health care provider. Condiments Any pasteurized condiments. Sweets and Desserts Any sweets and desserts. Fats and Oils Any fats and oils. The items listed above may not be a complete list of recommended foods or beverages. Contact your dietitian for more options. What foods are not recommended? Vegetables Unpasteurized (raw) vegetable juices. Fruits Unpasteurized (raw) fruit juices. Meats and Other Protein Sources Cured meats that have nitrates, such as bacon, salami, and hotdogs. Luncheon meats, bologna, or other deli meats (unless they are reheated until they are steaming hot). Refrigerated pate, meat spreads from a meat counter, smoked seafood that is found in the refrigerated section of a store. Raw fish, such as sushi or sashimi. High mercury content fish, such as tilefish, shark, swordfish, and king mackerel. Raw meats, such as tuna or beef tartare. Undercooked meats and poultry. Make sure that all meats are cooked to food-safe temperatures. Dairy Unpasteurized (raw) milk and any foods that have raw milk in them. Soft cheeses, such as feta, queso blanco, queso fresco, Brie, Camembert cheeses, blue-veined cheeses, and Panela cheese (unless it is made with pasteurized milk, which must be stated on the label). Beverages Alcohol. Sugar-sweetened beverages, such as sodas, teas, or energy drinks. Condiments Homemade fermented foods and drinks, such as pickles, sauerkraut, or kombucha drinks. (Store-bought pasteurized versions of these are  okay.) Other Salads that are made in the store, such as ham salad, chicken salad, egg salad, tuna salad, and seafood salad. The items listed above may not be a complete list of foods and beverages to avoid. Contact your dietitian for more information. This information is not intended to replace advice given to you by your health care provider. Make sure you discuss any questions you have with your health care provider. Document Released: 11/08/2013 Document Revised: 07/02/2015 Document Reviewed: 07/09/2013 Elsevier Interactive Patient Education  2018 [employer]. Round Ligament Pain The round ligament is a cord of muscle and tissue that helps to support the uterus. It can become a source of pain during pregnancy if it becomes stretched or twisted as the baby grows. The pain usually begins in the second trimester of pregnancy, and it can come and go until the baby is delivered. It is not a serious problem, and it does not cause harm to the baby. Round ligament pain is usually a short, sharp, and pinching pain, but it can also be a dull, lingering, and aching pain. The pain is felt in the lower side of the abdomen or in the groin. It usually starts deep in the groin and moves up to the outside of the hip area. Pain can occur with:  A sudden change in position.  Rolling over in bed.  Coughing or sneezing.  Physical activity.  Follow these instructions  at home: Watch your condition for any changes. Take these steps to help with your pain:  When the pain starts, relax. Then try: ? Sitting down. ? Flexing your knees up to your abdomen. ? Lying on your side with one pillow under your abdomen and another pillow between your legs. ? Sitting in a warm bath for 15-20 minutes or until the pain goes away.  Take over-the-counter and prescription medicines only as told by your health care provider.  Move slowly when you sit and stand.  Avoid long walks if they cause pain.  Stop or lessen your  physical activities if they cause pain.  Contact a health care provider if:  Your pain does not go away with treatment.  You feel pain in your back that you did not have before.  Your medicine is not helping. Get help right away if:  You develop a fever or chills.  You develop uterine contractions.  You develop vaginal bleeding.  You develop nausea or vomiting.  You develop diarrhea.  You have pain when you urinate. This information is not intended to replace advice given to you by your health care provider. Make sure you discuss any questions you have with your health care provider. Document Released: 11/03/2007 Document Revised: 07/02/2015 Document Reviewed: 04/02/2014 Elsevier Interactive Patient Education  2018 [employer]. Second Trimester of Pregnancy The second trimester is from week 14 through week 27 (months 4 through 6). The second trimester is often a time when you feel your best. Your body has adjusted to being pregnant, and you begin to feel better physically. Usually, morning sickness has lessened or quit completely, you may have more energy, and you may have an increase in appetite. The second trimester is also a time when the fetus is growing rapidly. At the end of the sixth month, the fetus is about 9 inches long and weighs about 1 pounds. You will likely begin to feel the baby move (quickening) between 16 and 20 weeks of pregnancy. Body changes during your second trimester Your body continues to go through many changes during your second trimester. The changes vary from woman to woman.  Your weight will continue to increase. You will notice your lower abdomen bulging out.  You may begin to get stretch marks on your hips, abdomen, and breasts.  You may develop headaches that can be relieved by medicines. The medicines should be approved by your health care provider.  You may urinate more often because the fetus is pressing on your bladder.  You may develop or  continue to have heartburn as a result of your pregnancy.  You may develop constipation because certain hormones are causing the muscles that push waste through your intestines to slow down.  You may develop hemorrhoids or swollen, bulging veins (varicose veins).  You may have back pain. This is caused by: ? Weight gain. ? Pregnancy hormones that are relaxing the joints in your pelvis. ? A shift in weight and the muscles that support your balance.  Your breasts will continue to grow and they will continue to become tender.  Your gums may bleed and may be sensitive to brushing and flossing.  Dark spots or blotches (chloasma, mask of pregnancy) may develop on your face. This will likely fade after the baby is born.  A dark line from your belly button to the pubic area (linea nigra) may appear. This will likely fade after the baby is born.  You may have changes in your hair. These  can include thickening of your hair, rapid growth, and changes in texture. Some women also have hair loss during or after pregnancy, or hair that feels dry or thin. Your hair will most likely return to normal after your baby is born.  What to expect at prenatal visits During a routine prenatal visit:  You will be weighed to make sure you and the fetus are growing normally.  Your blood pressure will be taken.  Your abdomen will be measured to track your baby's growth.  The fetal heartbeat will be listened to.  Any test results from the previous visit will be discussed.  Your health care provider may ask you:  How you are feeling.  If you are feeling the baby move.  If you have had any abnormal symptoms, such as leaking fluid, bleeding, severe headaches, or abdominal cramping.  If you are using any tobacco products, including cigarettes, chewing tobacco, and electronic cigarettes.  If you have any questions.  Other tests that may be performed during your second trimester include:  Blood tests that  check for: ? Low iron levels (anemia). ? High blood sugar that affects pregnant women (gestational diabetes) between 36 and 28 weeks. ? Rh antibodies. This is to check for a protein on red blood cells (Rh factor).  Urine tests to check for infections, diabetes, or protein in the urine.  An ultrasound to confirm the proper growth and development of the baby.  An amniocentesis to check for possible genetic problems.  Fetal screens for spina bifida and Down syndrome.  HIV (human immunodeficiency virus) testing. Routine prenatal testing includes screening for HIV, unless you choose not to have this test.  Follow these instructions at home: Medicines  Follow your health care provider's instructions regarding medicine use. Specific medicines may be either safe or unsafe to take during pregnancy.  Take a prenatal vitamin that contains at least 600 micrograms (mcg) of folic acid.  If you develop constipation, try taking a stool softener if your health care provider approves. Eating and drinking  Eat a balanced diet that includes fresh fruits and vegetables, whole grains, good sources of protein such as meat, eggs, or tofu, and low-fat dairy. Your health care provider will help you determine the amount of weight gain that is right for you.  Avoid raw meat and uncooked cheese. These carry germs that can cause birth defects in the baby.  If you have low calcium intake from food, talk to your health care provider about whether you should take a daily calcium supplement.  Limit foods that are high in fat and processed sugars, such as fried and sweet foods.  To prevent constipation: ? Drink enough fluid to keep your urine clear or pale yellow. ? Eat foods that are high in fiber, such as fresh fruits and vegetables, whole grains, and beans. Activity  Exercise only as directed by your health care provider. Most women can continue their usual exercise routine during pregnancy. Try to exercise  for 30 minutes at least 5 days a week. Stop exercising if you experience uterine contractions.  Avoid heavy lifting, wear low heel shoes, and practice good posture.  A sexual relationship may be continued unless your health care provider directs you otherwise. Relieving pain and discomfort  Wear a good support bra to prevent discomfort from breast tenderness.  Take warm sitz baths to soothe any pain or discomfort caused by hemorrhoids. Use hemorrhoid cream if your health care provider approves.  Rest with your legs elevated if  you have leg cramps or low back pain.  If you develop varicose veins, wear support hose. Elevate your feet for 15 minutes, 3-4 times a day. Limit salt in your diet. Prenatal Care  Write down your questions. Take them to your prenatal visits.  Keep all your prenatal visits as told by your health care provider. This is important. Safety  Wear your seat belt at all times when driving.  Make a list of emergency phone numbers, including numbers for family, friends, the hospital, and police and fire departments. General instructions  Ask your health care provider for a referral to a local prenatal education class. Begin classes no later than the beginning of month 6 of your pregnancy.  Ask for help if you have counseling or nutritional needs during pregnancy. Your health care provider can offer advice or refer you to specialists for help with various needs.  Do not use hot tubs, steam rooms, or saunas.  Do not douche or use tampons or scented sanitary pads.  Do not cross your legs for long periods of time.  Avoid cat litter boxes and soil used by cats. These carry germs that can cause birth defects in the baby and possibly loss of the fetus by miscarriage or stillbirth.  Avoid all smoking, herbs, alcohol, and unprescribed drugs. Chemicals in these products can affect the formation and growth of the baby.  Do not use any products that contain nicotine or  tobacco, such as cigarettes and e-cigarettes. If you need help quitting, ask your health care provider.  Visit your dentist if you have not gone yet during your pregnancy. Use a soft toothbrush to brush your teeth and be gentle when you floss. Contact a health care provider if:  You have dizziness.  You have mild pelvic cramps, pelvic pressure, or nagging pain in the abdominal area.  You have persistent nausea, vomiting, or diarrhea.  You have a bad smelling vaginal discharge.  You have pain when you urinate. Get help right away if:  You have a fever.  You are leaking fluid from your vagina.  You have spotting or bleeding from your vagina.  You have severe abdominal cramping or pain.  You have rapid weight gain or weight loss.  You have shortness of breath with chest pain.  You notice sudden or extreme swelling of your face, hands, ankles, feet, or legs.  You have not felt your baby move in over an hour.  You have severe headaches that do not go away when you take medicine.  You have vision changes. Summary  The second trimester is from week 14 through week 27 (months 4 through 6). It is also a time when the fetus is growing rapidly.  Your body goes through many changes during pregnancy. The changes vary from woman to woman.  Avoid all smoking, herbs, alcohol, and unprescribed drugs. These chemicals affect the formation and growth your baby.  Do not use any tobacco products, such as cigarettes, chewing tobacco, and e-cigarettes. If you need help quitting, ask your health care provider.  Contact your health care provider if you have any questions. Keep all prenatal visits as told by your health care provider. This is important. This information is not intended to replace advice given to you by your health care provider. Make sure you discuss any questions you have with your health care provider. Document Released: 01/18/2001 Document Revised: 07/02/2015 Document  Reviewed: 03/27/2012 Elsevier Interactive Patient Education  2017 [employer] Morning Sickness Morning sickness is  when you feel sick to your stomach (nauseous) during pregnancy. You may feel sick to your stomach and throw up (vomit). You may feel sick in the morning, but you can feel this way any time of day. Some women feel very sick to their stomach and cannot stop throwing up (hyperemesis gravidarum). Follow these instructions at home:  Only take medicines as told by your doctor.  Take multivitamins as told by your doctor. Taking multivitamins before getting pregnant can stop or lessen the harshness of morning sickness.  Eat dry toast or unsalted crackers before getting out of bed.  Eat 5 to 6 small meals a day.  Eat dry and bland foods like rice and baked potatoes.  Do not drink liquids with meals. Drink between meals.  Do not eat greasy, fatty, or spicy foods.  Have someone cook for you if the smell of food causes you to feel sick or throw up.  If you feel sick to your stomach after taking prenatal vitamins, take them at night or with a snack.  Eat protein when you need a snack (nuts, yogurt, cheese).  Eat unsweetened gelatins for dessert.  Wear a bracelet used for sea sickness (acupressure wristband).  Go to a doctor that puts thin needles into certain body points (acupuncture) to improve how you feel.  Do not smoke.  Use a humidifier to keep the air in your house free of odors.  Get lots of fresh air. Contact a doctor if:  You need medicine to feel better.  You feel dizzy or lightheaded.  You are losing weight. Get help right away if:  You feel very sick to your stomach and cannot stop throwing up.  You pass out (faint). This information is not intended to replace advice given to you by your health care provider. Make sure you discuss any questions you have with your health care provider. Document Released: 03/03/2004 Document Revised: 07/02/2015 Document  Reviewed: 07/11/2012 Elsevier Interactive Patient Education  2017 [employer].

## 2016-10-25 LAB — CBC WITH DIFFERENTIAL/PLATELET
BASOS: 0 %
Basophils Absolute: 0 10*3/uL (ref 0.0–0.2)
EOS (ABSOLUTE): 0.1 10*3/uL (ref 0.0–0.4)
EOS: 1 %
HEMATOCRIT: 34.1 % (ref 34.0–46.6)
HEMOGLOBIN: 11.1 g/dL (ref 11.1–15.9)
Immature Grans (Abs): 0 10*3/uL (ref 0.0–0.1)
Immature Granulocytes: 0 %
LYMPHS ABS: 1.5 10*3/uL (ref 0.7–3.1)
Lymphs: 19 %
MCH: 27.8 pg (ref 26.6–33.0)
MCHC: 32.6 g/dL (ref 31.5–35.7)
MCV: 85 fL (ref 79–97)
MONOCYTES: 5 %
Monocytes Absolute: 0.4 10*3/uL (ref 0.1–0.9)
NEUTROS ABS: 5.9 10*3/uL (ref 1.4–7.0)
Neutrophils: 75 %
Platelets: 202 10*3/uL (ref 150–379)
RBC: 4 x10E6/uL (ref 3.77–5.28)
RDW: 13 % (ref 12.3–15.4)
WBC: 7.9 10*3/uL (ref 3.4–10.8)

## 2016-10-25 LAB — COMPREHENSIVE METABOLIC PANEL
ALT: 9 IU/L (ref 0–32)
AST: 14 IU/L (ref 0–40)
Albumin/Globulin Ratio: 1.8 (ref 1.2–2.2)
Albumin: 4.3 g/dL (ref 3.5–5.5)
Alkaline Phosphatase: 51 IU/L (ref 39–117)
BUN/Creatinine Ratio: 12 (ref 9–23)
BUN: 6 mg/dL (ref 6–20)
Bilirubin Total: 0.5 mg/dL (ref 0.0–1.2)
CALCIUM: 9.4 mg/dL (ref 8.7–10.2)
CO2: 21 mmol/L (ref 20–29)
CREATININE: 0.52 mg/dL — AB (ref 0.57–1.00)
Chloride: 102 mmol/L (ref 96–106)
GFR, EST AFRICAN AMERICAN: 151 mL/min/{1.73_m2} (ref >59)
GFR, EST NON AFRICAN AMERICAN: 131 mL/min/{1.73_m2} (ref >59)
GLUCOSE: 80 mg/dL (ref 65–99)
Globulin, Total: 2.4 g/dL (ref 1.5–4.5)
Potassium: 4.1 mmol/L (ref 3.5–5.2)
Sodium: 138 mmol/L (ref 134–144)
TOTAL PROTEIN: 6.7 g/dL (ref 6.0–8.5)

## 2016-10-25 LAB — GC/CHLAMYDIA PROBE AMP
Chlamydia trachomatis, NAA: NEGATIVE
NEISSERIA GONORRHOEAE BY PCR: NEGATIVE

## 2016-10-25 LAB — RPR: RPR Ser Ql: NONREACTIVE

## 2016-10-25 LAB — ANTIBODY SCREEN: Antibody Screen: NEGATIVE

## 2016-10-25 LAB — ABO AND RH: Rh Factor: NEGATIVE

## 2016-10-25 LAB — RUBELLA SCREEN: Rubella Antibodies, IGG: 5.08 index (ref >0.99)

## 2016-10-25 LAB — HEPATITIS B SURFACE ANTIGEN: HEP B S AG: NEGATIVE

## 2016-10-25 LAB — VARICELLA ZOSTER ANTIBODY, IGG: VARICELLA: 171 {index} (ref >165)

## 2016-10-25 LAB — URIC ACID: Uric Acid: 5.4 mg/dL (ref 2.5–7.1)

## 2016-10-25 LAB — HEMOGLOBIN A1C
ESTIMATED AVERAGE GLUCOSE: 88 mg/dL
Hgb A1c MFr Bld: 4.7 % — ABNORMAL LOW (ref 4.8–5.6)

## 2016-10-25 LAB — TSH: TSH: 0.258 u[IU]/mL — ABNORMAL LOW (ref 0.450–4.500)

## 2016-10-25 LAB — HIV ANTIBODY (ROUTINE TESTING W REFLEX): HIV Screen 4th Generation wRfx: NONREACTIVE

## 2016-10-26 LAB — MONITOR DRUG PROFILE 14(MW)
Amphetamine Scrn, Ur: NEGATIVE ng/mL
BARBITURATE SCREEN URINE: NEGATIVE ng/mL
BENZODIAZEPINE SCREEN, URINE: NEGATIVE ng/mL
BUPRENORPHINE, URINE: NEGATIVE ng/mL
CANNABINOIDS UR QL SCN: NEGATIVE ng/mL
Cocaine (Metab) Scrn, Ur: NEGATIVE ng/mL
Creatinine(Crt), U: 153.6 mg/dL (ref 20.0–300.0)
FENTANYL, URINE: NEGATIVE pg/mL
Meperidine Screen, Urine: NEGATIVE ng/mL
Methadone Screen, Urine: NEGATIVE ng/mL
OPIATE SCREEN URINE: NEGATIVE ng/mL
OXYCODONE+OXYMORPHONE UR QL SCN: NEGATIVE ng/mL
PH UR, DRUG SCRN: 8 (ref 4.5–8.9)
PHENCYCLIDINE QUANTITATIVE URINE: NEGATIVE ng/mL
Propoxyphene Scrn, Ur: NEGATIVE ng/mL
SPECIFIC GRAVITY: 1.024
TRAMADOL SCREEN, URINE: NEGATIVE ng/mL

## 2016-10-26 LAB — PROTEIN / CREATININE RATIO, URINE
Creatinine, Urine: 143 mg/dL
PROTEIN/CREAT RATIO: 80 mg/g{creat} (ref 0–200)
Protein, Ur: 11.5 mg/dL

## 2016-10-26 LAB — MICROSCOPIC EXAMINATION: CASTS: NONE SEEN /LPF

## 2016-10-26 LAB — URINALYSIS, ROUTINE W REFLEX MICROSCOPIC
BILIRUBIN UA: NEGATIVE
GLUCOSE, UA: NEGATIVE
Nitrite, UA: POSITIVE — AB
PH UA: 8.5 — AB (ref 5.0–7.5)
PROTEIN UA: NEGATIVE
RBC UA: NEGATIVE
SPEC GRAV UA: 1.02 (ref 1.005–1.030)
Urobilinogen, Ur: 0.2 mg/dL (ref 0.2–1.0)

## 2016-10-26 LAB — URINE CULTURE: ORGANISM ID, BACTERIA: NO GROWTH

## 2016-10-27 LAB — T3FREE: TRIIODOTHYRONINE, FREE, SERUM: 3.3 pg/mL (ref 2.0–4.4)

## 2016-10-27 LAB — THYROID CASCADE PROFILE: TSH: 0.251 u[IU]/mL — ABNORMAL LOW (ref 0.450–4.500)

## 2016-10-27 LAB — THYROXINE (T4) FREE, DIRECT: T4,Free (Direct): 1.34 ng/dL (ref 0.82–1.77)

## 2016-10-27 LAB — SPECIMEN STATUS REPORT

## 2016-10-31 ENCOUNTER — Telehealth: Payer: Self-pay | Admitting: Certified Nurse Midwife

## 2016-10-31 NOTE — Telephone Encounter (Signed)
Pt is requesting a nurse to return her call to get her results of the genetic testing that was done on 10/24/16. Please advise. Thanks TNP

## 2016-10-31 NOTE — Telephone Encounter (Signed)
Notified pt no results ready

## 2016-11-02 ENCOUNTER — Telehealth: Payer: Self-pay | Admitting: Certified Nurse Midwife

## 2016-11-02 NOTE — Telephone Encounter (Signed)
Patient called requesting panorama results. Thanks

## 2016-11-03 NOTE — Telephone Encounter (Signed)
Pt called requesting results for Panorama that was done on 10/24/16. Please advise. Thanks TNP

## 2016-11-04 ENCOUNTER — Telehealth: Payer: Self-pay | Admitting: Certified Nurse Midwife

## 2016-11-04 NOTE — Telephone Encounter (Signed)
No results yet

## 2016-11-04 NOTE — Telephone Encounter (Signed)
Have checked [facility], no results yet

## 2016-11-04 NOTE — Telephone Encounter (Signed)
Patient called and stated that she would like to know the results of her recent panorama testing. No other information was disclosed. Please advise.

## 2016-11-07 ENCOUNTER — Telehealth: Payer: Self-pay

## 2016-11-07 NOTE — Telephone Encounter (Signed)
Pt aware panorama- low risk female.

## 2016-11-22 ENCOUNTER — Encounter: Payer: 59 | Admitting: Obstetrics and Gynecology

## 2016-11-25 ENCOUNTER — Encounter: Payer: Self-pay | Admitting: Obstetrics and Gynecology

## 2016-11-25 ENCOUNTER — Ambulatory Visit (INDEPENDENT_AMBULATORY_CARE_PROVIDER_SITE_OTHER): Payer: 59 | Admitting: Obstetrics and Gynecology

## 2016-11-25 VITALS — BP 121/75 | HR 83 | Wt 205.6 lb

## 2016-11-25 DIAGNOSIS — Z3492 Encounter for supervision of normal pregnancy, unspecified, second trimester: Secondary | ICD-10-CM

## 2016-11-25 LAB — POCT URINALYSIS DIPSTICK
BILIRUBIN UA: NEGATIVE
Blood, UA: NEGATIVE
GLUCOSE UA: NEGATIVE
Ketones, UA: NEGATIVE
LEUKOCYTES UA: NEGATIVE
NITRITE UA: NEGATIVE
PH UA: 6 (ref 5.0–8.0)
Protein, UA: NEGATIVE
Spec Grav, UA: 1.01 (ref 1.010–1.025)
Urobilinogen, UA: 0.2 E.U./dL

## 2016-11-25 NOTE — Progress Notes (Signed)
ROB- pt is doing well, still having nausea

## 2016-11-25 NOTE — Progress Notes (Signed)
ROB-feeling better, zofran not helping, constipation, anatomy scan.

## 2016-12-06 ENCOUNTER — Telehealth: Payer: Self-pay | Admitting: Obstetrics and Gynecology

## 2016-12-06 ENCOUNTER — Encounter: Payer: Self-pay | Admitting: Certified Nurse Midwife

## 2016-12-06 ENCOUNTER — Ambulatory Visit (INDEPENDENT_AMBULATORY_CARE_PROVIDER_SITE_OTHER): Payer: 59 | Admitting: Certified Nurse Midwife

## 2016-12-06 VITALS — BP 121/87 | HR 97

## 2016-12-06 DIAGNOSIS — Z3492 Encounter for supervision of normal pregnancy, unspecified, second trimester: Secondary | ICD-10-CM

## 2016-12-06 LAB — POCT URINALYSIS DIPSTICK
Bilirubin, UA: NEGATIVE
Blood, UA: NEGATIVE
Glucose, UA: NEGATIVE
Ketones, UA: NEGATIVE
LEUKOCYTES UA: NEGATIVE
NITRITE UA: NEGATIVE
PROTEIN UA: NEGATIVE
Spec Grav, UA: 1.01 (ref 1.010–1.025)
UROBILINOGEN UA: 0.2 U/dL
pH, UA: 6 (ref 5.0–8.0)

## 2016-12-06 NOTE — Telephone Encounter (Signed)
Jade Palmer Jade Palmer should I bring her in???

## 2016-12-06 NOTE — Telephone Encounter (Signed)
Yep, she may come in and see Pattricia Bossnnie for evaluation of possible orthostatic hypertension in pregnancy. Thanks, JML

## 2016-12-06 NOTE — Telephone Encounter (Signed)
Patient called stating she passed out at work last night. She works as a Engineer, civil (consulting)nurse. She went home and rested but is still getting dizzy while standing. She states she has been drinking water and eating enough. Thanks

## 2016-12-06 NOTE — Progress Notes (Signed)
Pt presents today for problem visit. She states that she felt like she was going to passed out. Reviewed common discomforts of pregnancy and circulation during pregnancy. Discussed standing slowly, changing position frequently, drinking 80-100 oz of water daily. Suggested suppression stocking to improve circulation. She verbalizes understanding and agrees to plan. Discuss consult with cardiology if worsening. Instructed to go to the emergency room if she fall and hits stomach or head.  Follow up as scheduled @ 20 wks.   Doreene BurkeAnnie Natoshia Souter, CNM

## 2016-12-06 NOTE — Patient Instructions (Signed)
Vasovagal Syncope, Adult  Syncope, which is commonly known as fainting or passing out, is a temporary loss of consciousness. It occurs when the blood flow to the brain is reduced. Vasovagal syncope, also called neurocardiogenic syncope, is a fainting spell that happens when blood flow to the brain is reduced because of a sudden drop in heart rate and blood pressure.  Vasovagal syncope is usually harmless. However, you can get injured if you fall during a fainting spell.  What are the causes?  This condition is caused by a drop in heart rate and blood pressure, usually in response to a trigger. Many things and situations can trigger an episode, including:  · Pain.  · Fear.  · The sight of blood. This may occur during medical procedures, such as when blood is being drawn from a vein.  · Common activities, such as coughing, swallowing, stretching, or going to the bathroom.  · Emotional stress.  · Being in a confined space.  · Prolonged standing, especially in a warm environment.  · Lack of sleep or rest.  · Not eating for a long time.  · Not drinking enough liquids.  · Recent illness.  · Drinking alcohol.  · Taking drugs that affect blood pressure, such as marijuana, cocaine, opiates, or inhalants.    What are the signs or symptoms?  Before a fainting episode, you may:  · Feel dizzy or light-headed.  · Become pale.  · Sense that you are going to faint.  · Feel like the room is spinning.  · Only see directly ahead (tunnel vision).  · Feel sick to your stomach (nauseous).  · See spots.  · Slowly lose vision.  · Hear ringing in your ears.  · Have a headache.  · Feel warm and sweaty.  · Feel a sensation of pins and needles.    During the fainting spell, you may twitch or make jerky movements. Fainting spells usually last no longer than a few minutes before you wake up. If you get up too quickly before your body can recover, you may faint again.  How is this diagnosed?  This condition is diagnosed based on your symptoms,  your medical history, and a physical exam. Tests may be done to rule out other causes of fainting. Tests may include:  · Blood tests.  · Heart tests, such as an electrocardiogram (ECG), echocardiogram, or electrophysiology study.  · A test to check your response to changes in position (tilt table test).    How is this treated?  Usually, treatment is not needed for this condition. Your health care provider may suggest ways to help prevent fainting episodes. These may include:  · Drinking additional fluids if you are exposed to a trigger.  · Sitting or lying down if you notice signs that an episode is coming.    If your fainting spells continue, your health care provider may recommend that you:  · Take medicines to prevent fainting or to help reduce further episodes of fainting.  · Do certain exercises.  · Wear compression stockings.  · Have surgery to place a pacemaker in your body (rare).    Follow these instructions at home:  · Learn to identify the signs that an episode is coming.  · Sit or lie down at the first sign of a fainting spell. If you sit down, put your head down between your legs. If you lie down, swing your legs up in the air to increase blood flow to the brain.  ·   Avoid hot tubs and saunas.  · Avoid standing for a long time. If you have to stand for a long time, try:  ? Crossing your legs.  ? Flexing and stretching your leg muscles.  ? Squatting.  ? Moving your legs.  ? Bending over.  · Drink enough fluid to keep your urine clear or pale yellow.  · Make changes to your diet that your health care provider recommends. You may be told to:  ? Avoid caffeine.  ? Eat more salt.  · Take over-the-counter and prescription medicines only as told by your health care provider.  Contact a health care provider if:  · You continue to have fainting spells despite treatment.  · You faint more often despite treatment.  · You lose consciousness for more than a few minutes.  · You faint during or after exercising or  after being startled.  · You have twitching or jerky movements for longer than a few seconds during a fainting spell.  · You have an episode of twitching or jerky movements without fainting.  Get help right away if:  · A fainting spell leads to an injury or bleeding.  · You have new symptoms that occur with the fainting spells, such as:  ? Shortness of breath.  ? Chest pain.  ? Irregular heartbeat.  · You twitch or make jerky movements for more than 5 minutes.  · You twitch or make jerky movements during more than one fainting spell.  This information is not intended to replace advice given to you by your health care provider. Make sure you discuss any questions you have with your health care provider.  Document Released: 01/11/2012 Document Revised: 07/08/2015 Document Reviewed: 11/22/2014  Elsevier Interactive Patient Education © 2018 [employer]

## 2016-12-13 ENCOUNTER — Other Ambulatory Visit: Payer: Self-pay | Admitting: Obstetrics and Gynecology

## 2016-12-13 DIAGNOSIS — Z369 Encounter for antenatal screening, unspecified: Secondary | ICD-10-CM

## 2016-12-23 ENCOUNTER — Ambulatory Visit (INDEPENDENT_AMBULATORY_CARE_PROVIDER_SITE_OTHER): Payer: 59

## 2016-12-23 ENCOUNTER — Other Ambulatory Visit: Payer: 59

## 2016-12-23 ENCOUNTER — Ambulatory Visit (INDEPENDENT_AMBULATORY_CARE_PROVIDER_SITE_OTHER): Payer: 59 | Admitting: Certified Nurse Midwife

## 2016-12-23 VITALS — BP 127/79 | HR 80 | Wt 207.6 lb

## 2016-12-23 DIAGNOSIS — Z369 Encounter for antenatal screening, unspecified: Secondary | ICD-10-CM | POA: Diagnosis not present

## 2016-12-23 DIAGNOSIS — Z3492 Encounter for supervision of normal pregnancy, unspecified, second trimester: Secondary | ICD-10-CM

## 2016-12-23 LAB — POCT URINALYSIS DIPSTICK
BILIRUBIN UA: NEGATIVE
GLUCOSE UA: NEGATIVE
Ketones, UA: NEGATIVE
Nitrite, UA: NEGATIVE
PH UA: 6 (ref 5.0–8.0)
Protein, UA: NEGATIVE
RBC UA: NEGATIVE
SPEC GRAV UA: 1.015 (ref 1.010–1.025)
Urobilinogen, UA: 0.2 E.U./dL

## 2016-12-23 NOTE — Progress Notes (Signed)
ROB-Report round ligament pain. Discussed home treatment measures including use of abdominal support. Anatomy scan complete and normal, pt contact via phone with results, verbalized understanding. Reviewed red flag symptoms and when to call. RTC x 4 weeks for ROB with Pattricia BossAnnie or sooner if needed.   ULTRASOUND REPORT  Location: [facility] Date of Service:  12/23/16  Indications:  Anatomy Findings:  Singleton intrauterine pregnancy is visualized with FHR at 149 BPM. Biometrics give an (U/S) Gestational age of 27 4/7 weeks and an (U/S) EDD of 05/15/17; this correlates with the clinically established EDD of 05/11/17.  Fetal presentation is breech.  EFW: 301 grams (0lb 11oz). Placenta: Anterior and grade 1.  Placenta is 5.7 cm from cervical os. AFI: WNL subjectively.  Anatomic survey is complete and appears WNL; Gender - Female.   Right Ovary measures 3.6 x 2.6 x 2.3 cm and appears WNL.  Left Ovary was not visualized. There is no obvious evidence of a corpus luteal cyst. Survey of the adnexa demonstrates no adnexal masses. There is no free peritoneal fluid in the cul de sac.  Impression: 1. 19 4/7 week Viable Singleton Intrauterine pregnancy by U/S. 2. (U/S) EDD is consistent with Clinically established (LMP) EDD of 05/11/17. 3. Normal Anatomy Scan  Recommendations: 1.Clinical correlation with the patient's History and Physical Exam.

## 2016-12-23 NOTE — Patient Instructions (Signed)
Common Medications Safe in Pregnancy  Acne:      Constipation:  Benzoyl Peroxide     Colace  Clindamycin      Dulcolax Suppository  Topica Erythromycin     Fibercon  Salicylic Acid      Metamucil         Miralax AVOID:        Senakot   Accutane    Cough:  Retin-A       Cough Drops  Tetracycline      Phenergan w/ Codeine if Rx  Minocycline      Robitussin (Plain & DM)  Antibiotics:     Crabs/Lice:  Ceclor       RID  Cephalosporins    AVOID:  E-Mycins      Kwell  Keflex  Macrobid/Macrodantin   Diarrhea:  Penicillin      Kao-Pectate  Zithromax      Imodium AD         PUSH FLUIDS AVOID:       Cipro     Fever:  Tetracycline      Tylenol (Regular or Extra  Minocycline       Strength)  Levaquin      Extra Strength-Do not          Exceed 8 tabs/24 hrs Caffeine:        <200mg/day (equiv. To 1 cup of coffee or  approx. 3 12 oz sodas)         Gas: Cold/Hayfever:       Gas-X  Benadryl      Mylicon  Claritin       Phazyme  **Claritin-D        Chlor-Trimeton    Headaches:  Dimetapp      ASA-Free Excedrin  Drixoral-Non-Drowsy     Cold Compress  Mucinex (Guaifenasin)     Tylenol (Regular or Extra  Sudafed/Sudafed-12 Hour     Strength)  **Sudafed PE Pseudoephedrine   Tylenol Cold & Sinus     Vicks Vapor Rub  Zyrtec  **AVOID if Problems With Blood Pressure         Heartburn: Avoid lying down for at least 1 hour after meals  Aciphex      Maalox     Rash:  Milk of Magnesia     Benadryl    Mylanta       1% Hydrocortisone Cream  Pepcid  Pepcid Complete   Sleep Aids:  Prevacid      Ambien   Prilosec       Benadryl  Rolaids       Chamomile Tea  Tums (Limit 4/day)     Unisom  Zantac       Tylenol PM         Warm milk-add vanilla or  Hemorrhoids:       Sugar for taste  Anusol/Anusol H.C.  (RX: Analapram 2.5%)  Sugar Substitutes:  Hydrocortisone OTC     Ok in moderation  Preparation H      Tucks        Vaseline lotion applied to tissue with  wiping    Herpes:     Throat:  Acyclovir      Oragel  Famvir  Valtrex     Vaccines:         Flu Shot Leg Cramps:       *Gardasil  Benadryl      Hepatitis A         Hepatitis B Nasal Spray:         Pneumovax  Saline Nasal Spray     Polio Booster         Tetanus Nausea:       Tuberculosis test or PPD  Vitamin B6 25 mg TID   AVOID:    Dramamine      *Gardasil  Emetrol       Live Poliovirus  Ginger Root 250 mg QID    MMR (measles, mumps &  High Complex Carbs @ Bedtime    rebella)  Sea Bands-Accupressure    Varicella (Chickenpox)  Unisom 1/2 tab TID     *No known complications           If received before Pain:         Known pregnancy;   Darvocet       Resume series after  Lortab        Delivery  Percocet    Yeast:   Tramadol      Femstat  Tylenol 3      Gyne-lotrimin  Ultram       Monistat  Vicodin           MISC:         All Sunscreens           Hair Coloring/highlights          Insect Repellant's          (Including DEET)         Mystic Tans Back Pain in Pregnancy Back pain during pregnancy is common. Back pain may be caused by several factors that are related to changes during your pregnancy. Follow these instructions at home: Managing pain, stiffness, and swelling  If directed, apply ice for sudden (acute) back pain. ? Put ice in a plastic bag. ? Place a towel between your skin and the bag. ? Leave the ice on for 20 minutes, 2-3 times per day.  If directed, apply heat to the affected area before you exercise: ? Place a towel between your skin and the heat pack or heating pad. ? Leave the heat on for 20-30 minutes. ? Remove the heat if your skin turns bright red. This is especially important if you are unable to feel pain, heat, or cold. You may have a greater risk of getting burned. Activity  Exercise as told by your health care provider. Exercising is the best way to prevent or manage back pain.  Listen to your body when lifting. If lifting hurts, ask for help or  bend your knees. This uses your leg muscles instead of your back muscles.  Squat down when picking up something from the floor. Do not bend over.  Only use bed rest as told by your health care provider. Bed rest should only be used for the most severe episodes of back pain. Standing, Sitting, and Lying Down  Do not stand in one place for long periods of time.  Use good posture when sitting. Make sure your head rests over your shoulders and is not hanging forward. Use a pillow on your lower back if necessary.  Try sleeping on your side, preferably the left side, with a pillow or two between your legs. If you are sore after a night's rest, your bed may be too soft. A firm mattress may provide more support for your back during pregnancy. General instructions  Do not wear high heels.  Eat a healthy diet. Try to gain weight within your health care provider's recommendations.  Use a maternity girdle, elastic sling, or   back brace as told by your health care provider.  Take over-the-counter and prescription medicines only as told by your health care provider.  Keep all follow-up visits as told by your health care provider. This is important. This includes any visits with any specialists, such as a physical therapist. Contact a health care provider if:  Your back pain interferes with your daily activities.  You have increasing pain in other parts of your body. Get help right away if:  You develop numbness, tingling, weakness, or problems with the use of your arms or legs.  You develop severe back pain that is not controlled with medicine.  You have a sudden change in bowel or bladder control.  You develop shortness of breath, dizziness, or you faint.  You develop nausea, vomiting, or sweating.  You have back pain that is a rhythmic, cramping pain similar to labor pains. Labor pain is usually 1-2 minutes apart, lasts for about 1 minute, and involves a bearing down feeling or pressure in  your pelvis.  You have back pain and your water breaks or you have vaginal bleeding.  You have back pain or numbness that travels down your leg.  Your back pain developed after you fell.  You develop pain on one side of your back.  You see blood in your urine.  You develop skin blisters in the area of your back pain. This information is not intended to replace advice given to you by your health care provider. Make sure you discuss any questions you have with your health care provider. Document Released: 05/04/2005 Document Revised: 07/02/2015 Document Reviewed: 10/08/2014 Elsevier Interactive Patient Education  2018 [employer]. Round Ligament Pain The round ligament is a cord of muscle and tissue that helps to support the uterus. It can become a source of pain during pregnancy if it becomes stretched or twisted as the baby grows. The pain usually begins in the second trimester of pregnancy, and it can come and go until the baby is delivered. It is not a serious problem, and it does not cause harm to the baby. Round ligament pain is usually a short, sharp, and pinching pain, but it can also be a dull, lingering, and aching pain. The pain is felt in the lower side of the abdomen or in the groin. It usually starts deep in the groin and moves up to the outside of the hip area. Pain can occur with:  A sudden change in position.  Rolling over in bed.  Coughing or sneezing.  Physical activity.  Follow these instructions at home: Watch your condition for any changes. Take these steps to help with your pain:  When the pain starts, relax. Then try: ? Sitting down. ? Flexing your knees up to your abdomen. ? Lying on your side with one pillow under your abdomen and another pillow between your legs. ? Sitting in a warm bath for 15-20 minutes or until the pain goes away.  Take over-the-counter and prescription medicines only as told by your health care provider.  Move slowly when you sit  and stand.  Avoid long walks if they cause pain.  Stop or lessen your physical activities if they cause pain.  Contact a health care provider if:  Your pain does not go away with treatment.  You feel pain in your back that you did not have before.  Your medicine is not helping. Get help right away if:  You develop a fever or chills.  You develop uterine contractions.  You develop vaginal bleeding.  You develop nausea or vomiting.  You develop diarrhea.  You have pain when you urinate. This information is not intended to replace advice given to you by your health care provider. Make sure you discuss any questions you have with your health care provider. Document Released: 11/03/2007 Document Revised: 07/02/2015 Document Reviewed: 04/02/2014 Elsevier Interactive Patient Education  [employer].

## 2016-12-23 NOTE — Progress Notes (Signed)
ROB- pt is doing well, has anatomy scan today @ 3:30

## 2017-01-20 ENCOUNTER — Encounter: Payer: 59 | Admitting: Certified Nurse Midwife

## 2017-01-27 ENCOUNTER — Ambulatory Visit (INDEPENDENT_AMBULATORY_CARE_PROVIDER_SITE_OTHER): Payer: 59 | Admitting: Certified Nurse Midwife

## 2017-01-27 ENCOUNTER — Encounter: Payer: Self-pay | Admitting: Certified Nurse Midwife

## 2017-01-27 VITALS — BP 114/69 | HR 83 | Wt 214.4 lb

## 2017-01-27 DIAGNOSIS — Z3492 Encounter for supervision of normal pregnancy, unspecified, second trimester: Secondary | ICD-10-CM

## 2017-01-27 LAB — POCT URINALYSIS DIPSTICK
BILIRUBIN UA: NEGATIVE
Blood, UA: NEGATIVE
GLUCOSE UA: NEGATIVE
Ketones, UA: NEGATIVE
LEUKOCYTES UA: NEGATIVE
Nitrite, UA: NEGATIVE
Protein, UA: NEGATIVE
Spec Grav, UA: 1.025 (ref 1.010–1.025)
Urobilinogen, UA: 0.2 E.U./dL
pH, UA: 5 (ref 5.0–8.0)

## 2017-01-27 NOTE — Progress Notes (Signed)
Pt is here for an ROB visit. 

## 2017-01-27 NOTE — Patient Instructions (Signed)

## 2017-01-27 NOTE — Progress Notes (Signed)
ROB, doing well. Pt complains of cold. Discussed medication options. Referred to medication list. Pt instructed to follow up if she develops a fever. She verbalizes understanding and agrees to plan. Discussed Glucola testing at next visit in 3 wks.   Doreene BurkeAnnie Sabastien Tyler, CNM

## 2017-02-06 DIAGNOSIS — Z3A27 27 weeks gestation of pregnancy: Secondary | ICD-10-CM | POA: Diagnosis not present

## 2017-02-06 DIAGNOSIS — R05 Cough: Secondary | ICD-10-CM | POA: Diagnosis not present

## 2017-02-07 NOTE — L&D Delivery Note (Addendum)
     Delivery Note   Jade Palmer is a 27 y.o. G2P0101 at 5216w1d Estimated Date of Delivery: 05/11/17  PRE-OPERATIVE DIAGNOSIS:  1) 2416w1d pregnancy.   POST-OPERATIVE DIAGNOSIS:  1) 6716w1d pregnancy s/p Vaginal, Spontaneous   Delivery Type: Vaginal, Spontaneous    Delivery Anesthesia: Epidural   Labor Complications:   none    ESTIMATED BLOOD LOSS: 250 ml    FINDINGS:   1) female infant, Apgar scores of 8    at 1 minute and 9    at 5 minutes and a birthweight is pending. Baby is skin to skin.    2) Nuchal cord: no  SPECIMENS:   PLACENTA:   Appearance: Intact , 3 vessel cord, cord blood sample obtained   Removal: Spontaneous      Disposition:   held per protocol then discarded  DISPOSITION:  Infant to left in stable condition in the delivery room, with L&D personnel and mother,  NARRATIVE SUMMARY: Labor course:  [name] is a [id number] at 2416w1d who presented for induction of labor.  She progressed well in labor with pitocin.  She received the appropriate epidural anesthesia and proceeded to complete dilation. She evidenced good maternal expulsive effort during the second stage. She went on to deliver a viable female infant " [name]". The placenta delivered without problems and was noted to be complete. A perineal and vaginal examination was performed. Episiotomy/Lacerations: None  Skid mark at perineum that was oozing. 1 stitch placed with 3-0 vicryl suture. The patient tolerated this well.  Doreene Burkennie Cela Newcom, CNM  05/05/2017 4:39 PM

## 2017-02-15 ENCOUNTER — Telehealth: Payer: Self-pay

## 2017-02-15 NOTE — Telephone Encounter (Signed)
Call transferred from from desk- Pt has some vaginal bleeding yesterday. Small amount when she wiped. A little more today. Maybe the size of a quarter. Wearing a pantie liner. It is bright red. NO uti sx, fever, or n/v/d. Pt did have IC yesterday. Pos fm. NO ctx. Pos for pressure. Advised pt to monitor for now. Take tylenol prn. If bleeding increases or cramps develop to contact office. Pt voices understanding. Pt is at work today.

## 2017-02-16 ENCOUNTER — Other Ambulatory Visit: Payer: Self-pay

## 2017-02-16 ENCOUNTER — Ambulatory Visit (INDEPENDENT_AMBULATORY_CARE_PROVIDER_SITE_OTHER): Payer: No Typology Code available for payment source | Admitting: Certified Nurse Midwife

## 2017-02-16 VITALS — BP 112/71 | HR 92 | Wt 215.0 lb

## 2017-02-16 DIAGNOSIS — Z23 Encounter for immunization: Secondary | ICD-10-CM | POA: Diagnosis not present

## 2017-02-16 DIAGNOSIS — Z3493 Encounter for supervision of normal pregnancy, unspecified, third trimester: Secondary | ICD-10-CM

## 2017-02-16 DIAGNOSIS — O09299 Supervision of pregnancy with other poor reproductive or obstetric history, unspecified trimester: Secondary | ICD-10-CM

## 2017-02-16 MED ORDER — RHO D IMMUNE GLOBULIN 1500 UNIT/2ML IJ SOSY: 300.0000 ug | PREFILLED_SYRINGE | Freq: Once | INTRAMUSCULAR | Status: DC

## 2017-02-16 NOTE — Progress Notes (Signed)
ROB-Reports bleeding after intercourse, no active bleeding at this time. Reviewed red flag symptoms and when to call. TDaP/Rhogam today. CBC, glucola, RPR and PIH baseline labs collected. Plans breastfeeding and epidural. Education regarding [facility] doula program, perinatal education class including sibling tour, cord blood banking, and postpartum contraception. Anticipatory guidance regarding course of prenatal care. RTC x 2 weeks for ROB with Pattricia BossAnnie or sooner if needed.

## 2017-02-16 NOTE — Progress Notes (Signed)
ROB- Patient feels well with no complaints.  

## 2017-02-16 NOTE — Patient Instructions (Addendum)
Pain Relief During Labor and Delivery Many things can cause pain during labor and delivery, including:  Pressure on bones and ligaments due to the baby moving through the pelvis.  Stretching of tissues due to the baby moving through the birth canal.  Muscle tension due to anxiety or nervousness.  The uterus tightening (contracting) and relaxing to help move the baby.  There are many ways to deal with the pain of labor and delivery. They include:  Taking prenatal classes. Taking these classes helps you know what to expect during your baby's birth. What you learn will increase your confidence and decrease your anxiety.  Practicing relaxation techniques or doing relaxing activities, such as: ? Focused breathing. ? Meditation. ? Visualization. ? Aroma therapy. ? Listening to your favorite music. ? Hypnosis.  Taking a warm shower or bath (hydrotherapy). This may: ? Provide comfort and relaxation. ? Lessen your perception of pain. ? Decrease the amount of pain medicine needed. ? Decrease the length of labor.  Getting a massage or counterpressure on your back.  Applying warm packs or ice packs.  Changing positions often, moving around, or using a birthing ball.  Getting: ? Pain medicine through an IV or injection into a muscle. ? Pain medicine inserted into your spinal column. ? Injections of sterile water just under the skin on your lower back (intradermal injections). ? Laughing gas (nitrous oxide).  Discuss your pain control options with your health care provider during your prenatal visits. Explore the options offered by your hospital or birth center. What kinds of medicine are available? There are two kinds of medicines that can be used to relieve pain during labor and delivery:  Analgesics. These medicines decrease pain without causing you to lose feeling or the ability to move your muscles.  Anesthetics. These medicines block feeling in the body and can decrease your  ability to move freely.  Both of these kinds of medicine can cause minor side effects, such as nausea, trouble concentrating, and sleepiness. They can also decrease the baby's heart rate before birth and affect the baby's breathing rate after birth. For this reason, health care providers are careful about when and how much medicine is given. What are specific medicines and procedures that provide pain relief? Local Anesthetics Local anesthetics are used to numb a small area of the body. They may be used along with another kind of anesthetic or used to numb the nerves of the vagina, cervix, and perineum during the second stage of labor. General Anesthetics General anesthetics cause you to lose consciousness so you do not feel pain. They are usually only used for an emergency cesarean delivery. General anesthetics are given through an IV tube and a mask. Pudendal Block A pudendal block is a form of local anesthetic. It may be used to relieve the pain associated with pushing or stretching of the perineum at the time of delivery or to further numb the perineum. A pudendal block is done by injecting numbing medicine through the vaginal wall into a nerve in the pelvis. Epidural Analgesia Epidural analgesia is given through a flexible IV catheter that is inserted into the lower back. Numbing medicine is delivered continuously to the area near your spinal column nerves (epidural space). After having this type of analgesia, you may be able to move your legs but you most likely will not be able to walk. Depending on the amount of medicine given, you may lose all feeling in the lower half of your body, or you may  retain some level of sensation, including the urge to push. Epidural analgesia can be used to provide pain relief for a vaginal birth. Spinal Block A spinal block is similar to epidural analgesia, but the medicine is injected into the spinal fluid instead of the epidural space. A spinal block is only  given once. It starts to relieve pain quickly, but the pain relief lasts only 1-6 hours. Spinal blocks can be used for cesarean deliveries. Combined Spinal-Epidural (CSE) Block A CSE block combines the effects of a spinal block and epidural analgesia. The spinal block works quickly to block all pain. The epidural analgesia provides continuous pain relief, even after the effects of the spinal block have worn off. This information is not intended to replace advice given to you by your health care provider. Make sure you discuss any questions you have with your health care provider. Document Released: 05/12/2008 Document Revised: 07/03/2015 Document Reviewed: 06/17/2015 Elsevier Interactive Patient Education  2018 [employer]. Common Medications Safe in Pregnancy  Acne:      Constipation:  Benzoyl Peroxide     Colace  Clindamycin      Dulcolax Suppository  Topica Erythromycin     Fibercon  Salicylic Acid      Metamucil         Miralax AVOID:        Senakot   Accutane    Cough:  Retin-A       Cough Drops  Tetracycline      Phenergan w/ Codeine if Rx  Minocycline      Robitussin (Plain & DM)  Antibiotics:     Crabs/Lice:  Ceclor       RID  Cephalosporins    AVOID:  E-Mycins      Kwell  Keflex  Macrobid/Macrodantin   Diarrhea:  Penicillin      Kao-Pectate  Zithromax      Imodium AD         PUSH FLUIDS AVOID:       Cipro     Fever:  Tetracycline      Tylenol (Regular or Extra  Minocycline       Strength)  Levaquin      Extra Strength-Do not          Exceed 8 tabs/24 hrs Caffeine:        <279m/day (equiv. To 1 cup of coffee or  approx. 3 12 oz sodas)         Gas: Cold/Hayfever:       Gas-X  Benadryl      Mylicon  Claritin       Phazyme  **Claritin-D        Chlor-Trimeton    Headaches:  Dimetapp      ASA-Free Excedrin  Drixoral-Non-Drowsy     Cold Compress  Mucinex (Guaifenasin)     Tylenol (Regular or Extra  Sudafed/Sudafed-12 Hour     Strength)  **Sudafed PE  Pseudoephedrine   Tylenol Cold & Sinus     Vicks Vapor Rub  Zyrtec  **AVOID if Problems With Blood Pressure         Heartburn: Avoid lying down for at least 1 hour after meals  Aciphex      Maalox     Rash:  Milk of Magnesia     Benadryl    Mylanta       1% Hydrocortisone Cream  Pepcid  Pepcid Complete   Sleep Aids:  Prevacid      Ambien   Prilosec  Benadryl  Rolaids       Chamomile Tea  Tums (Limit 4/day)     Unisom  Zantac       Tylenol PM         Warm milk-add vanilla or  Hemorrhoids:       Sugar for taste  Anusol/Anusol H.C.  (RX: Analapram 2.5%)  Sugar Substitutes:  Hydrocortisone OTC     Ok in moderation  Preparation H      Tucks        Vaseline lotion applied to tissue with wiping    Herpes:     Throat:  Acyclovir      Oragel  Famvir  Valtrex     Vaccines:         Flu Shot Leg Cramps:       *Gardasil  Benadryl      Hepatitis A         Hepatitis B Nasal Spray:       Pneumovax  Saline Nasal Spray     Polio Booster         Tetanus Nausea:       Tuberculosis test or PPD  Vitamin B6 25 mg TID   AVOID:    Dramamine      *Gardasil  Emetrol       Live Poliovirus  Ginger Root 250 mg QID    MMR (measles, mumps &  High Complex Carbs @ Bedtime    rebella)  Sea Bands-Accupressure    Varicella (Chickenpox)  Unisom 1/2 tab TID     *No known complications           If received before Pain:         Known pregnancy;   Darvocet       Resume series after  Lortab        Delivery  Percocet    Yeast:   Tramadol      Femstat  Tylenol 3      Gyne-lotrimin  Ultram       Monistat  Vicodin           MISC:         All Sunscreens           Hair Coloring/highlights          Insect Repellant's          (Including DEET)         Mystic Tans Third Trimester of Pregnancy The third trimester is from week 29 through week 42, months 7 through 9. This trimester is when your unborn baby (fetus) is growing very fast. At the end of the ninth month, the unborn baby is about 20  inches in length. It weighs about 6-10 pounds. Follow these instructions at home:  Avoid all smoking, herbs, and alcohol. Avoid drugs not approved by your doctor.  Do not use any tobacco products, including cigarettes, chewing tobacco, and electronic cigarettes. If you need help quitting, ask your doctor. You may get counseling or other support to help you quit.  Only take medicine as told by your doctor. Some medicines are safe and some are not during pregnancy.  Exercise only as told by your doctor. Stop exercising if you start having cramps.  Eat regular, healthy meals.  Wear a good support bra if your breasts are tender.  Do not use hot tubs, steam rooms, or saunas.  Wear your seat belt when driving.  Avoid raw meat, uncooked cheese, and liter boxes and soil used  by cats.  Take your prenatal vitamins.  Take 1500-2000 milligrams of calcium daily starting at the 20th week of pregnancy until you deliver your baby.  Try taking medicine that helps you poop (stool softener) as needed, and if your doctor approves. Eat more fiber by eating fresh fruit, vegetables, and whole grains. Drink enough fluids to keep your pee (urine) clear or pale yellow.  Take warm water baths (sitz baths) to soothe pain or discomfort caused by hemorrhoids. Use hemorrhoid cream if your doctor approves.  If you have puffy, bulging veins (varicose veins), wear support hose. Raise (elevate) your feet for 15 minutes, 3-4 times a day. Limit salt in your diet.  Avoid heavy lifting, wear low heels, and sit up straight.  Rest with your legs raised if you have leg cramps or low back pain.  Visit your dentist if you have not gone during your pregnancy. Use a soft toothbrush to brush your teeth. Be gentle when you floss.  You can have sex (intercourse) unless your doctor tells you not to.  Do not travel far distances unless you must. Only do so with your doctor's approval.  Take prenatal classes.  Practice  driving to the hospital.  Pack your hospital bag.  Prepare the baby's room.  Go to your doctor visits. Get help if:  You are not sure if you are in labor or if your water has broken.  You are dizzy.  You have mild cramps or pressure in your lower belly (abdominal).  You have a nagging pain in your belly area.  You continue to feel sick to your stomach (nauseous), throw up (vomit), or have watery poop (diarrhea).  You have bad smelling fluid coming from your vagina.  You have pain with peeing (urination). Get help right away if:  You have a fever.  You are leaking fluid from your vagina.  You are spotting or bleeding from your vagina.  You have severe belly cramping or pain.  You lose or gain weight rapidly.  You have trouble catching your breath and have chest pain.  You notice sudden or extreme puffiness (swelling) of your face, hands, ankles, feet, or legs.  You have not felt the baby move in over an hour.  You have severe headaches that do not go away with medicine.  You have vision changes. This information is not intended to replace advice given to you by your health care provider. Make sure you discuss any questions you have with your health care provider. Document Released: 04/20/2009 Document Revised: 07/02/2015 Document Reviewed: 03/27/2012 Elsevier Interactive Patient Education  2017 [employer].  Cord Blood Banking Information Cord blood banking is the process of collecting and storing the blood that is in the umbilical cord and placenta at the time of delivery. This blood contains stem cells, which can be used to treat many blood diseases, immune system disorders, and childhood cancers. Stem cells can also be used to research certain diseases and treatments. Many people who choose cord blood banking donate the blood. Donated blood can be used in lifesaving treatments or for research. Other people choose to store the blood privately. Blood that is stored  privately can only be used with the person's permission. This option is often chosen if:  A family member needs a stem cell transplant.  The child is part of an ethnic minority.  The child was conceived through in vitro fertilization.  What should I look for in a blood bank? A blood bank is the  organization that coordinates cord blood banking. Make sure the cord blood bank that you use:  Is accredited.  Is financially stable.  Handles a large volume of cord blood samples.  Has a procedure in place for transport and storage.  Allows you the option of transferring your cord blood sample.  Has a procedure in place if the bank goes out of business.  Clearly states all costs and limits to future costs.  People who choose to donate cord blood should not need to pay for blood banking. People who keep the blood for private use will need to pay for the first (initial) storage and pay a fee each year (annual fee). Other fees may also apply. What are the risks of cord blood banking? There are no health risks associated with cord blood banking. It is considered safe. How should I prepare? You must schedule this process at least 4-6 weeks before you will be giving birth. How is the blood collected? The blood is collected as soon as the baby has been delivered. Within 15 minutes of delivery, a health care provider will take these actions to collect the blood:  Clamp the umbilical cord at the top and bottom. This traps the blood in the umbilical cord.  Use a syringe or bag to collect the blood.  Insert needles into the placenta to collect (draw out) more blood.  What happens after the blood is collected? After the blood has been collected:  The blood will be sent to a blood bank.  The blood will be tested for genetic problems and infectious diseases. If the blood tests positive for a genetic problem or a disease, someone will contact you and let you know.  The blood will be  frozen.  If your child develops a genetic condition, immune system disorder, or cancer, you will be responsible for contacting the blood bank and letting them know. This information is not intended to replace advice given to you by your health care provider. Make sure you discuss any questions you have with your health care provider. Document Released: 07/14/2009 Document Revised: 07/02/2015 Document Reviewed: 07/14/2014 Elsevier Interactive Patient Education  2018 [employer].

## 2017-02-17 LAB — COMPREHENSIVE METABOLIC PANEL
ALT: 10 IU/L (ref 0–32)
AST: 9 IU/L (ref 0–40)
Albumin/Globulin Ratio: 1.5 (ref 1.2–2.2)
Albumin: 3.8 g/dL (ref 3.5–5.5)
Alkaline Phosphatase: 69 IU/L (ref 39–117)
BUN/Creatinine Ratio: 9 (ref 9–23)
BUN: 4 mg/dL — ABNORMAL LOW (ref 6–20)
Bilirubin Total: 0.2 mg/dL (ref 0.0–1.2)
CALCIUM: 8.8 mg/dL (ref 8.7–10.2)
CO2: 21 mmol/L (ref 20–29)
CREATININE: 0.44 mg/dL — AB (ref 0.57–1.00)
Chloride: 103 mmol/L (ref 96–106)
GFR calc Af Amer: 160 mL/min/{1.73_m2} (ref >59)
GFR, EST NON AFRICAN AMERICAN: 139 mL/min/{1.73_m2} (ref >59)
Globulin, Total: 2.6 g/dL (ref 1.5–4.5)
Glucose: 108 mg/dL — ABNORMAL HIGH (ref 65–99)
Potassium: 4.1 mmol/L (ref 3.5–5.2)
Sodium: 138 mmol/L (ref 134–144)
Total Protein: 6.4 g/dL (ref 6.0–8.5)

## 2017-02-17 LAB — CBC WITH DIFFERENTIAL/PLATELET
BASOS: 0 %
Basophils Absolute: 0 10*3/uL (ref 0.0–0.2)
EOS (ABSOLUTE): 0.1 10*3/uL (ref 0.0–0.4)
EOS: 1 %
HEMATOCRIT: 28.2 % — AB (ref 34.0–46.6)
Hemoglobin: 9.1 g/dL — ABNORMAL LOW (ref 11.1–15.9)
IMMATURE GRANULOCYTES: 1 %
Immature Grans (Abs): 0.1 10*3/uL (ref 0.0–0.1)
Lymphocytes Absolute: 1.6 10*3/uL (ref 0.7–3.1)
Lymphs: 15 %
MCH: 27.7 pg (ref 26.6–33.0)
MCHC: 32.3 g/dL (ref 31.5–35.7)
MCV: 86 fL (ref 79–97)
Monocytes Absolute: 0.5 10*3/uL (ref 0.1–0.9)
Monocytes: 5 %
NEUTROS PCT: 78 %
Neutrophils Absolute: 8.2 10*3/uL — ABNORMAL HIGH (ref 1.4–7.0)
PLATELETS: 227 10*3/uL (ref 150–379)
RBC: 3.28 x10E6/uL — ABNORMAL LOW (ref 3.77–5.28)
RDW: 14.5 % (ref 12.3–15.4)
WBC: 10.4 10*3/uL (ref 3.4–10.8)

## 2017-02-17 LAB — RPR: RPR: NONREACTIVE

## 2017-02-17 LAB — GLUCOSE TOLERANCE, 1 HOUR: GLUCOSE, 1HR PP: 100 mg/dL (ref 65–199)

## 2017-02-17 LAB — PROTEIN / CREATININE RATIO, URINE
Creatinine, Urine: 104.7 mg/dL
Protein, Ur: 15.4 mg/dL
Protein/Creat Ratio: 147 mg/g creat (ref 0–200)

## 2017-02-17 LAB — URIC ACID: URIC ACID: 5 mg/dL (ref 2.5–7.1)

## 2017-02-19 ENCOUNTER — Encounter: Payer: Self-pay | Admitting: Certified Nurse Midwife

## 2017-02-19 DIAGNOSIS — O99019 Anemia complicating pregnancy, unspecified trimester: Secondary | ICD-10-CM | POA: Insufficient documentation

## 2017-02-21 ENCOUNTER — Other Ambulatory Visit: Payer: Self-pay | Admitting: Certified Nurse Midwife

## 2017-03-03 ENCOUNTER — Encounter: Payer: Self-pay | Admitting: Certified Nurse Midwife

## 2017-03-06 ENCOUNTER — Ambulatory Visit (INDEPENDENT_AMBULATORY_CARE_PROVIDER_SITE_OTHER): Payer: No Typology Code available for payment source | Admitting: Certified Nurse Midwife

## 2017-03-06 VITALS — BP 122/71 | HR 91 | Wt 221.0 lb

## 2017-03-06 DIAGNOSIS — Z3493 Encounter for supervision of normal pregnancy, unspecified, third trimester: Secondary | ICD-10-CM | POA: Diagnosis not present

## 2017-03-06 LAB — POCT URINALYSIS DIPSTICK
BILIRUBIN UA: NEGATIVE
Blood, UA: NEGATIVE
Glucose, UA: NEGATIVE
KETONES UA: NEGATIVE
Leukocytes, UA: NEGATIVE
Nitrite, UA: POSITIVE
SPEC GRAV UA: 1.02 (ref 1.010–1.025)
UROBILINOGEN UA: 0.2 U/dL
pH, UA: 7 (ref 5.0–8.0)

## 2017-03-06 MED ORDER — FUSION PLUS PO CAPS: 1.0000 | ORAL_CAPSULE | Freq: Every day | ORAL | 6 refills | Status: DC

## 2017-03-06 MED ORDER — NITROFURANTOIN MONOHYD MACRO 100 MG PO CAPS: 100.0000 mg | ORAL_CAPSULE | Freq: Two times a day (BID) | ORAL | 0 refills | Status: AC

## 2017-03-06 NOTE — Addendum Note (Signed)
Addended by: Mechele ClaudeHOMPSON, Dreux Mcgroarty M on: 03/06/2017 11:27 AM   Modules accepted: Orders

## 2017-03-06 NOTE — Patient Instructions (Signed)
[place] [facility]ian List   [facility]  [address], [place], [place] [address]  Phone: [phone]   [facility] (second location)  [address] [place], [place] [address]  Phone: [phone]   [facility] ([place]) [address], [place], [place] [address] Phone: [phone]   [facility]  [address] , [place] [address]  Phone: [phone] a Baby Grows During Pregnancy Pregnancy begins when a female's sperm enters a female's egg (fertilization). This happens in one of the tubes (fallopian tubes) that connect the ovaries to the womb (uterus). The fertilized egg is called an embryo until it reaches 10 weeks. From 10 weeks until birth, it is called a fetus. The fertilized egg moves down the fallopian tube to the uterus. Then it implants into the lining of the uterus and begins to grow. The developing fetus receives oxygen and nutrients through the pregnant woman's bloodstream and the tissues that grow (placenta) to support the fetus. The placenta is the life support system for the fetus. It provides nutrition and removes waste. Learning as much as you can about your pregnancy and how your baby is developing can help you enjoy the experience. It can also make you aware of when there might be a problem and when to ask questions. How long does a typical pregnancy last? A pregnancy usually lasts 280 days, or about 40 weeks. Pregnancy is divided into three trimesters:  First trimester: 0-13 weeks.  Second trimester: 14-27 weeks.  Third trimester: 28-40 weeks.  The day when your baby is considered ready to be born (full term) is your estimated date of delivery. How does my baby develop month by month? First month  The fertilized egg attaches to the inside of the uterus.  Some cells will form the placenta. Others will form the fetus.  The arms, legs, brain, spinal cord, lungs, and heart begin to develop.  At  the end of the first month, the heart begins to beat.  Second month  The bones, inner ear, eyelids, hands, and feet form.  The genitals develop.  By the end of 8 weeks, all major organs are developing.  Third month  All of the internal organs are forming.  Teeth develop below the gums.  Bones and muscles begin to grow. The spine can flex.  The skin is transparent.  Fingernails and toenails begin to form.  Arms and legs continue to grow longer, and hands and feet develop.  The fetus is about 3 in (7.6 cm) long.  Fourth month  The placenta is completely formed.  The external sex organs, neck, outer ear, eyebrows, eyelids, and fingernails are formed.  The fetus can hear, swallow, and move its arms and legs.  The kidneys begin to produce urine.  The skin is covered with a white waxy coating (vernix) and very fine hair (lanugo).  Fifth month  The fetus moves around more and can be felt for the first time (quickening).  The fetus starts to sleep and wake up and may begin to suck its finger.  The nails grow to the end of the fingers.  The organ in the digestive system that makes bile (gallbladder) functions and helps to digest the nutrients.  If your baby is a girl, eggs are present in her ovaries. If your baby is a boy, testicles start to move down into his scrotum.  Sixth month  The lungs are formed, but the fetus is not yet able to breathe.    The eyes open. The brain continues to develop.  Your baby has fingerprints and toe prints. Your baby's hair grows thicker.  At the end of the second trimester, the fetus is about 9 in (22.9 cm) long.  Seventh month  The fetus kicks and stretches.  The eyes are developed enough to sense changes in light.  The hands can make a grasping motion.  The fetus responds to sound.  Eighth month  All organs and body systems are fully developed and functioning.  Bones harden and taste buds develop. The fetus may  hiccup.  Certain areas of the brain are still developing. The skull remains soft.  Ninth month  The fetus gains about  lb (0.23 kg) each week.  The lungs are fully developed.  Patterns of sleep develop.  The fetus's head typically moves into a head-down position (vertex) in the uterus to prepare for birth. If the buttocks move into a vertex position instead, the baby is breech.  The fetus weighs 6-9 lbs (2.72-4.08 kg) and is 19-20 in (48.26-50.8 cm) long.  What can I do to have a healthy pregnancy and help my baby develop? Eating and Drinking  Eat a healthy diet. ? Talk with your health care provider to make sure that you are getting the nutrients that you and your baby need. ? Visit www.choosemyplate.gov to learn about creating a healthy diet.  Gain a healthy amount of weight during pregnancy as advised by your health care provider. This is usually 25-35 pounds. You may need to: ? Gain more if you were underweight before getting pregnant or if you are pregnant with more than one baby. ? Gain less if you were overweight or obese when you got pregnant.  Medicines and Vitamins  Take prenatal vitamins as directed by your health care provider. These include vitamins such as folic acid, iron, calcium, and vitamin D. They are important for healthy development.  Take medicines only as directed by your health care provider. Read labels and ask a pharmacist or your health care provider whether over-the-counter medicines, supplements, and prescription drugs are safe to take during pregnancy.  Activities  Be physically active as advised by your health care provider. Ask your health care provider to recommend activities that are safe for you to do, such as walking or swimming.  Do not participate in strenuous or extreme sports.  Lifestyle  Do not drink alcohol.  Do not use any tobacco products, including cigarettes, chewing tobacco, or electronic cigarettes. If you need help quitting,  ask your health care provider.  Do not use illegal drugs.  Safety  Avoid exposure to mercury, lead, or other heavy metals. Ask your health care provider about common sources of these heavy metals.  Avoid listeria infection during pregnancy. Follow these precautions: ? Do not eat soft cheeses or deli meats. ? Do not eat hot dogs unless they have been warmed up to the point of steaming, such as in the microwave oven. ? Do not drink unpasteurized milk.  Avoid toxoplasmosis infection during pregnancy. Follow these precautions: ? Do not change your cat's litter box, if you have a cat. Ask someone else to do this for you. ? Wear gardening gloves while working in the yard.  General Instructions  Keep all follow-up visits as directed by your health care provider. This is important. This includes prenatal care and screening tests.  Manage any chronic health conditions. Work closely with your health care provider to keep conditions, such as diabetes, under control.    How do I know if my baby is developing well? At each prenatal visit, your health care provider will do several different tests to check on your health and keep track of your baby's development. These include:  Fundal height. ? Your health care provider will measure your growing belly from top to bottom using a tape measure. ? Your health care provider will also feel your belly to determine your baby's position.  Heartbeat. ? An ultrasound in the first trimester can confirm pregnancy and show a heartbeat, depending on how far along you are. ? Your health care provider will check your baby's heart rate at every prenatal visit. ? As you get closer to your delivery date, you may have regular fetal heart rate monitoring to make sure that your baby is not in distress.  Second trimester ultrasound. ? This ultrasound checks your baby's development. It also indicates your baby's gender.  What should I do if I have concerns about my  baby's development? Always talk with your health care provider about any concerns that you may have. This information is not intended to replace advice given to you by your health care provider. Make sure you discuss any questions you have with your health care provider. Document Released: 07/13/2007 Document Revised: 07/02/2015 Document Reviewed: 07/03/2013 Elsevier Interactive Patient Education  2018 [employer]  

## 2017-03-06 NOTE — Progress Notes (Addendum)
ROB, doing well . She states she had to episodes of getting light headed. She denies any SOB, of chest discomfort. She denies standing quickly or for a long period of times. She states she was up moving around and went to get report and while in pt room she started to get hot then dizzy. She sat down and it resolved. Discussed that if she had any associated symptoms or if it continued to happen we could get cardiology consult. She verbalized understanding and agrees.Pt is anemic, fusion plus ordered with instructions.  Urine showed nitrates order placed for urine culture and Macrobid. She feels good movement and denies contractions. Follow up 2 wks.   Jade BurkeAnnie Viola Placeres, CNM

## 2017-03-06 NOTE — Progress Notes (Signed)
Pt is here for an ROB visit. States she has had a few instances of BP dropping while at work and she feels like she is going to pass out.

## 2017-03-08 ENCOUNTER — Encounter: Payer: Self-pay | Admitting: Certified Nurse Midwife

## 2017-03-08 LAB — URINE CULTURE: ORGANISM ID, BACTERIA: NO GROWTH

## 2017-03-20 ENCOUNTER — Ambulatory Visit (INDEPENDENT_AMBULATORY_CARE_PROVIDER_SITE_OTHER): Payer: No Typology Code available for payment source | Admitting: Certified Nurse Midwife

## 2017-03-20 VITALS — BP 114/81 | HR 83 | Wt 222.8 lb

## 2017-03-20 DIAGNOSIS — Z3483 Encounter for supervision of other normal pregnancy, third trimester: Secondary | ICD-10-CM

## 2017-03-20 DIAGNOSIS — O99013 Anemia complicating pregnancy, third trimester: Secondary | ICD-10-CM

## 2017-03-20 LAB — POCT URINALYSIS DIPSTICK
Bilirubin, UA: NEGATIVE
Blood, UA: NEGATIVE
Glucose, UA: NEGATIVE
KETONES UA: NEGATIVE
NITRITE UA: NEGATIVE
Protein, UA: NEGATIVE
SPEC GRAV UA: 1.015 (ref 1.010–1.025)
UROBILINOGEN UA: 0.2 U/dL
pH, UA: 7.5 (ref 5.0–8.0)

## 2017-03-20 NOTE — Progress Notes (Signed)
PT is having pain/oressure and she think she had some contractions 03/19/17 while at work but unsure.

## 2017-03-20 NOTE — Progress Notes (Signed)
ROB doing well. No complaints. Repeat cbc today for anemia at 28 wks. Will follow up with results. ROB in 2 wks.   Doreene BurkeAnnie Ayda Tancredi, CNM

## 2017-03-21 ENCOUNTER — Other Ambulatory Visit: Payer: Self-pay | Admitting: Certified Nurse Midwife

## 2017-03-21 DIAGNOSIS — O99013 Anemia complicating pregnancy, third trimester: Secondary | ICD-10-CM

## 2017-03-21 LAB — CBC
Hematocrit: 27.8 % — ABNORMAL LOW (ref 34.0–46.6)
Hemoglobin: 9.1 g/dL — ABNORMAL LOW (ref 11.1–15.9)
MCH: 28.6 pg (ref 26.6–33.0)
MCHC: 32.7 g/dL (ref 31.5–35.7)
MCV: 87 fL (ref 79–97)
PLATELETS: 197 10*3/uL (ref 150–379)
RBC: 3.18 x10E6/uL — ABNORMAL LOW (ref 3.77–5.28)
RDW: 15.1 % (ref 12.3–15.4)
WBC: 10.4 10*3/uL (ref 3.4–10.8)

## 2017-03-21 NOTE — Progress Notes (Signed)
Pt called to discussed CBC results. She states she has been taking the fusion plus iron pill. Reviewed plan of care. Order placed for Iron & Vitamin B 12 Folate. She verbalizes understanding and agrees to plan of care.   Doreene BurkeAnnie Jewelene Mairena, CNM

## 2017-04-03 ENCOUNTER — Encounter: Payer: Self-pay | Admitting: Certified Nurse Midwife

## 2017-04-03 ENCOUNTER — Other Ambulatory Visit: Payer: Self-pay

## 2017-04-03 ENCOUNTER — Ambulatory Visit (INDEPENDENT_AMBULATORY_CARE_PROVIDER_SITE_OTHER): Payer: No Typology Code available for payment source | Admitting: Certified Nurse Midwife

## 2017-04-03 VITALS — BP 128/69 | HR 89 | Wt 227.0 lb

## 2017-04-03 DIAGNOSIS — Z3483 Encounter for supervision of other normal pregnancy, third trimester: Secondary | ICD-10-CM | POA: Diagnosis not present

## 2017-04-03 DIAGNOSIS — O99013 Anemia complicating pregnancy, third trimester: Secondary | ICD-10-CM

## 2017-04-03 LAB — POCT URINALYSIS DIPSTICK
BILIRUBIN UA: NEGATIVE
Glucose, UA: NEGATIVE
Ketones, UA: NEGATIVE
Leukocytes, UA: NEGATIVE
NITRITE UA: NEGATIVE
PH UA: 7 (ref 5.0–8.0)
PROTEIN UA: NEGATIVE
RBC UA: NEGATIVE
Spec Grav, UA: 1.015 (ref 1.010–1.025)
UROBILINOGEN UA: 0.2 U/dL

## 2017-04-03 NOTE — Patient Instructions (Signed)
Anemia Anemia is a condition in which you do not have enough red blood cells or hemoglobin. Hemoglobin is a substance in red blood cells that carries oxygen. When you do not have enough red blood cells or hemoglobin (are anemic), your body cannot get enough oxygen and your organs may not work properly. As a result, you may feel very tired or have other problems. What are the causes? Common causes of anemia include:  Excessive bleeding. Anemia can be caused by excessive bleeding inside or outside the body, including bleeding from the intestine or from periods in women.  Poor nutrition.  Long-lasting (chronic) kidney, thyroid, and liver disease.  Bone marrow disorders.  Cancer and treatments for cancer.  HIV (human immunodeficiency virus) and AIDS (acquired immunodeficiency syndrome).  Treatments for HIV and AIDS.  Spleen problems.  Blood disorders.  Infections, medicines, and autoimmune disorders that destroy red blood cells.  What are the signs or symptoms? Symptoms of this condition include:  Minor weakness.  Dizziness.  Headache.  Feeling heartbeats that are irregular or faster than normal (palpitations).  Shortness of breath, especially with exercise.  Paleness.  Cold sensitivity.  Indigestion.  Nausea.  Difficulty sleeping.  Difficulty concentrating.  Symptoms may occur suddenly or develop slowly. If your anemia is mild, you may not have symptoms. How is this diagnosed? This condition is diagnosed based on:  Blood tests.  Your medical history.  A physical exam.  Bone marrow biopsy.  Your health care provider may also check your stool (feces) for blood and may do additional testing to look for the cause of your bleeding. You may also have other tests, including:  Imaging tests, such as a CT scan or MRI.  Endoscopy.  Colonoscopy.  How is this treated? Treatment for this condition depends on the cause. If you continue to lose a lot of blood,  you may need to be treated at a hospital. Treatment may include:  Taking supplements of iron, vitamin B12, or folic acid.  Taking a hormone medicine (erythropoietin) that can help to stimulate red blood cell growth.  Having a blood transfusion. This may be needed if you lose a lot of blood.  Making changes to your diet.  Having surgery to remove your spleen.  Follow these instructions at home:  Take over-the-counter and prescription medicines only as told by your health care provider.  Take supplements only as told by your health care provider.  Follow any diet instructions that you were given.  Keep all follow-up visits as told by your health care provider. This is important. Contact a health care provider if:  You develop new bleeding anywhere in the body. Get help right away if:  You are very weak.  You are short of breath.  You have pain in your abdomen or chest.  You are dizzy or feel faint.  You have trouble concentrating.  You have bloody or black, tarry stools.  You vomit repeatedly or you vomit up blood. Summary  Anemia is a condition in which you do not have enough red blood cells or enough of a substance in your red blood cells that carries oxygen (hemoglobin).  Symptoms may occur suddenly or develop slowly.  If your anemia is mild, you may not have symptoms.  This condition is diagnosed with blood tests as well as a medical history and physical exam. Other tests may be needed.  Treatment for this condition depends on the cause of the anemia. This information is not intended to replace advice   given to you by your health care provider. Make sure you discuss any questions you have with your health care provider. Document Released: 03/03/2004 Document Revised: 02/26/2016 Document Reviewed: 02/26/2016 Elsevier Interactive Patient Education  [employer].

## 2017-04-03 NOTE — Progress Notes (Signed)
ROB doing well. She never had her iron checked. Will check today. She states she takes it when she remembers. She denies having any more dizzy spells. Anticipatory guidance for 36 wk cultures and next visit. She verbalizes understanding and agrees to plan. Follow up 2 wks.   Doreene BurkeAnnie Kaileb Palmer, CNM

## 2017-04-03 NOTE — Addendum Note (Signed)
Addended by: Brooke DareSICK, Zadaya Cuadra L on: 04/03/2017 12:37 PM   Modules accepted: Orders

## 2017-04-03 NOTE — Progress Notes (Signed)
Pt is here for an ROB visit. Had one good contraction last night.

## 2017-04-04 ENCOUNTER — Other Ambulatory Visit: Payer: Self-pay | Admitting: Certified Nurse Midwife

## 2017-04-04 DIAGNOSIS — E538 Deficiency of other specified B group vitamins: Secondary | ICD-10-CM

## 2017-04-04 LAB — CBC
Hematocrit: 27.9 % — ABNORMAL LOW (ref 34.0–46.6)
Hemoglobin: 9.3 g/dL — ABNORMAL LOW (ref 11.1–15.9)
MCH: 28.1 pg (ref 26.6–33.0)
MCHC: 33.3 g/dL (ref 31.5–35.7)
MCV: 84 fL (ref 79–97)
Platelets: 200 10*3/uL (ref 150–379)
RBC: 3.31 x10E6/uL — AB (ref 3.77–5.28)
RDW: 15.1 % (ref 12.3–15.4)
WBC: 9.8 10*3/uL (ref 3.4–10.8)

## 2017-04-04 LAB — VITAMIN B12: Vitamin B-12: 174 pg/mL — ABNORMAL LOW (ref 232–1245)

## 2017-04-04 LAB — IRON: IRON: 82 ug/dL (ref 27–159)

## 2017-04-04 LAB — FOLATE: Folate: 8.9 ng/mL (ref >3.0)

## 2017-04-04 MED ORDER — CYANOCOBALAMIN 1000 MCG/ML IJ SOLN: 1000.0000 ug | Freq: Once | INTRAMUSCULAR | 0 refills | Status: AC

## 2017-04-04 NOTE — Progress Notes (Signed)
Vitamin B 12 deficiency noted. Intrinsic factor antibody ordered and vitamin B 12 injections ordered.  Called pt and left message for her to call me back.   Doreene BurkeAnnie Chipper Koudelka, CNM

## 2017-04-06 ENCOUNTER — Other Ambulatory Visit: Payer: No Typology Code available for payment source

## 2017-04-06 DIAGNOSIS — E538 Deficiency of other specified B group vitamins: Secondary | ICD-10-CM

## 2017-04-07 LAB — INTRINSIC FACTOR ANTIBODIES: Intrinsic Factor Abs, Serum: 1 AU/mL (ref 0.0–1.1)

## 2017-04-09 ENCOUNTER — Encounter: Payer: Self-pay | Admitting: Obstetrics and Gynecology

## 2017-04-09 ENCOUNTER — Observation Stay (HOSPITAL_BASED_OUTPATIENT_CLINIC_OR_DEPARTMENT_OTHER)
Admission: EM | Admit: 2017-04-09 | Discharge: 2017-04-10 | Disposition: A | Discharge status: Home / Self Care | Payer: No Typology Code available for payment source | Source: Home / Self Care | Admitting: Obstetrics and Gynecology

## 2017-04-09 ENCOUNTER — Observation Stay: Payer: No Typology Code available for payment source

## 2017-04-09 DIAGNOSIS — R3129 Other microscopic hematuria: Secondary | ICD-10-CM | POA: Insufficient documentation

## 2017-04-09 DIAGNOSIS — O99013 Anemia complicating pregnancy, third trimester: Secondary | ICD-10-CM

## 2017-04-09 DIAGNOSIS — Z3A35 35 weeks gestation of pregnancy: Secondary | ICD-10-CM

## 2017-04-09 DIAGNOSIS — D649 Anemia, unspecified: Secondary | ICD-10-CM

## 2017-04-09 DIAGNOSIS — O09899 Supervision of other high risk pregnancies, unspecified trimester: Secondary | ICD-10-CM

## 2017-04-09 DIAGNOSIS — N1330 Unspecified hydronephrosis: Secondary | ICD-10-CM | POA: Diagnosis not present

## 2017-04-09 DIAGNOSIS — O99019 Anemia complicating pregnancy, unspecified trimester: Secondary | ICD-10-CM | POA: Diagnosis present

## 2017-04-09 DIAGNOSIS — Z7982 Long term (current) use of aspirin: Secondary | ICD-10-CM

## 2017-04-09 DIAGNOSIS — R109 Unspecified abdominal pain: Secondary | ICD-10-CM

## 2017-04-09 DIAGNOSIS — O26893 Other specified pregnancy related conditions, third trimester: Secondary | ICD-10-CM

## 2017-04-09 DIAGNOSIS — O9989 Other specified diseases and conditions complicating pregnancy, childbirth and the puerperium: Secondary | ICD-10-CM | POA: Insufficient documentation

## 2017-04-09 DIAGNOSIS — Z87442 Personal history of urinary calculi: Secondary | ICD-10-CM | POA: Insufficient documentation

## 2017-04-09 DIAGNOSIS — O09219 Supervision of pregnancy with history of pre-term labor, unspecified trimester: Secondary | ICD-10-CM

## 2017-04-09 LAB — CBC
HEMATOCRIT: 30 % — AB (ref 35.0–47.0)
Hemoglobin: 9.8 g/dL — ABNORMAL LOW (ref 12.0–16.0)
MCH: 28.4 pg (ref 26.0–34.0)
MCHC: 32.7 g/dL (ref 32.0–36.0)
MCV: 86.7 fL (ref 80.0–100.0)
PLATELETS: 179 10*3/uL (ref 150–440)
RBC: 3.46 MIL/uL — AB (ref 3.80–5.20)
RDW: 15.2 % — ABNORMAL HIGH (ref 11.5–14.5)
WBC: 11.9 10*3/uL — AB (ref 3.6–11.0)

## 2017-04-09 LAB — COMPREHENSIVE METABOLIC PANEL
ALT: 10 U/L — AB (ref 14–54)
AST: 24 U/L (ref 15–41)
Albumin: 3.1 g/dL — ABNORMAL LOW (ref 3.5–5.0)
Alkaline Phosphatase: 93 U/L (ref 38–126)
Anion gap: 12 (ref 5–15)
BUN: 9 mg/dL (ref 6–20)
CHLORIDE: 105 mmol/L (ref 101–111)
CO2: 18 mmol/L — ABNORMAL LOW (ref 22–32)
Calcium: 8.8 mg/dL — ABNORMAL LOW (ref 8.9–10.3)
Creatinine, Ser: 0.67 mg/dL (ref 0.44–1.00)
GFR calc Af Amer: >60 mL/min (ref >60)
Glucose, Bld: 106 mg/dL — ABNORMAL HIGH (ref 65–99)
Potassium: 4.1 mmol/L (ref 3.5–5.1)
Sodium: 135 mmol/L (ref 135–145)
Total Bilirubin: 0.6 mg/dL (ref 0.3–1.2)
Total Protein: 6.9 g/dL (ref 6.5–8.1)

## 2017-04-09 LAB — URINALYSIS, ROUTINE W REFLEX MICROSCOPIC
Bilirubin Urine: NEGATIVE
Glucose, UA: NEGATIVE mg/dL
Ketones, ur: NEGATIVE mg/dL
Nitrite: NEGATIVE
Protein, ur: 30 mg/dL — AB
SPECIFIC GRAVITY, URINE: 1.017 (ref 1.005–1.030)
pH: 5 (ref 5.0–8.0)

## 2017-04-09 MED ORDER — TAMSULOSIN HCL 0.4 MG PO CAPS: 0.4000 mg | ORAL_CAPSULE | Freq: Every day | ORAL | 0 refills | Status: DC

## 2017-04-09 MED ORDER — LACTATED RINGERS IV SOLN: INTRAVENOUS | Status: DC

## 2017-04-09 MED ORDER — PROMETHAZINE HCL 25 MG/ML IJ SOLN
25.0000 mg | Freq: Four times a day (QID) | INTRAMUSCULAR | Status: DC | PRN
Start: 2017-04-09 — End: 2017-04-09
  Administered 2017-04-09: 25 mg via INTRAMUSCULAR
  Filled 2017-04-09: qty 1

## 2017-04-09 MED ORDER — PROMETHAZINE HCL 25 MG PO TABS
25.0000 mg | ORAL_TABLET | Freq: Four times a day (QID) | ORAL | Status: DC | PRN
  Administered 2017-04-09 – 2017-04-10 (×3): 25 mg via ORAL
  Filled 2017-04-09 (×4): qty 1

## 2017-04-09 MED ORDER — ONDANSETRON HCL 4 MG PO TABS
4.0000 mg | ORAL_TABLET | Freq: Four times a day (QID) | ORAL | Status: DC | PRN
  Administered 2017-04-10: 4 mg via ORAL
  Filled 2017-04-09: qty 1

## 2017-04-09 MED ORDER — PROMETHAZINE HCL 25 MG PO TABS: 25.0000 mg | ORAL_TABLET | Freq: Four times a day (QID) | ORAL | 0 refills | Status: DC | PRN

## 2017-04-09 MED ORDER — HYDROMORPHONE HCL 1 MG/ML IJ SOLN
1.0000 mg | INTRAMUSCULAR | Status: DC | PRN
  Administered 2017-04-09 (×2): 1 mg via INTRAVENOUS
  Administered 2017-04-10 (×2): 2 mg via INTRAVENOUS
  Filled 2017-04-09: qty 1
  Filled 2017-04-09: qty 2
  Filled 2017-04-09: qty 1
  Filled 2017-04-09: qty 2

## 2017-04-09 MED ORDER — MORPHINE SULFATE (PF) 4 MG/ML IV SOLN
8.0000 mg | INTRAVENOUS | Status: DC | PRN
  Administered 2017-04-09 – 2017-04-10 (×4): 8 mg via INTRAMUSCULAR
  Filled 2017-04-09 (×4): qty 2

## 2017-04-09 MED ORDER — ACETAMINOPHEN 500 MG PO TABS
ORAL_TABLET | ORAL | Status: AC
  Administered 2017-04-09: 1000 mg via ORAL
  Filled 2017-04-09: qty 2

## 2017-04-09 MED ORDER — ONDANSETRON HCL 4 MG PO TABS: 4.0000 mg | ORAL_TABLET | Freq: Four times a day (QID) | ORAL | 0 refills | Status: DC | PRN

## 2017-04-09 MED ORDER — LACTATED RINGERS IV BOLUS (SEPSIS)
1000.0000 mL | Freq: Once | INTRAVENOUS | Status: AC
  Administered 2017-04-09: 1000 mL via INTRAVENOUS

## 2017-04-09 MED ORDER — ACETAMINOPHEN 500 MG PO TABS
1000.0000 mg | ORAL_TABLET | Freq: Four times a day (QID) | ORAL | Status: DC | PRN
  Administered 2017-04-09 (×2): 1000 mg via ORAL
  Filled 2017-04-09 (×3): qty 2

## 2017-04-09 MED ORDER — CYCLOBENZAPRINE HCL 10 MG PO TABS
10.0000 mg | ORAL_TABLET | Freq: Three times a day (TID) | ORAL | Status: DC | PRN
  Administered 2017-04-09: 10 mg via ORAL
  Filled 2017-04-09: qty 1

## 2017-04-09 MED ORDER — OXYCODONE-ACETAMINOPHEN 5-325 MG PO TABS: 1.0000 | ORAL_TABLET | Freq: Four times a day (QID) | ORAL | 0 refills | Status: DC | PRN

## 2017-04-09 MED ORDER — ACETAMINOPHEN 500 MG PO TABS
1000.0000 mg | ORAL_TABLET | Freq: Once | ORAL | Status: AC
  Administered 2017-04-09: 1000 mg via ORAL

## 2017-04-09 MED ORDER — OXYCODONE-ACETAMINOPHEN 5-325 MG PO TABS
1.0000 | ORAL_TABLET | Freq: Four times a day (QID) | ORAL | Status: DC | PRN
  Administered 2017-04-09 (×2): 1 via ORAL
  Administered 2017-04-10: 2 via ORAL
  Administered 2017-04-10: 1 via ORAL
  Filled 2017-04-09 (×3): qty 1
  Filled 2017-04-09: qty 2
  Filled 2017-04-09: qty 1

## 2017-04-09 MED ORDER — TAMSULOSIN HCL 0.4 MG PO CAPS
0.4000 mg | ORAL_CAPSULE | Freq: Every day | ORAL | Status: DC
  Administered 2017-04-09 – 2017-04-10 (×2): 0.4 mg via ORAL
  Filled 2017-04-09 (×2): qty 1

## 2017-04-09 MED ORDER — ONDANSETRON HCL 4 MG/2ML IJ SOLN
4.0000 mg | Freq: Four times a day (QID) | INTRAMUSCULAR | Status: DC | PRN
  Administered 2017-04-09: 4 mg via INTRAVENOUS
  Filled 2017-04-09: qty 2

## 2017-04-09 NOTE — Consult Note (Signed)
Urology Consult  I have been asked to see the patient by Gunnar Bulla, for evaluation and management of left flank pain.  Chief Complaint: Left flank pain  History of Present Illness: Jade Palmer is a 27 y.o. year old with IUP at 35 weeks who is in labor and delivery observation for left flank pain.  She had acute onset of left flank pain at 0400 this morning.  The pain has been intermittent without identifiable precipitating, aggravating or alleviating factors.  She has a prior history of stone disease however states her last stone was several years ago and she is not sure if her presenting symptoms are similar to prior stone symptoms.  She denies nausea, vomiting, fever or chills.  A urinalysis showed too numerous to count RBCs however there were 6-30 squamous epithelial cells indicating a contaminated specimen.  A renal ultrasound showed mild left hydronephrosis.  Her pain was initially difficult to control however she states it is currently managed with oral narcotic analgesics.  She denies dysuria or gross hematuria.  She was started on tamsulosin today.  Past Medical History:  Diagnosis Date  . Contraceptive education 04/12/2013  . History of kidney stones   . Seizures (HCC)    had seizure at age 67. Unknown etiology- no meds for this in about 5 years    Past Surgical History:  Procedure Laterality Date  . BALLOON DILATION Right 04/25/2013   Procedure: RIGHT URETER BALLOON DILATION;  Surgeon: Ky Barban, MD;  Location: [facility];  Service: Urology;  Laterality: Right;  . CYSTOSCOPY/RETROGRADE/URETEROSCOPY/STONE EXTRACTION WITH BASKET Right 04/25/2013   Procedure: CYSTOSCOPY/RIGHT RETROGRADE/RIGHT URETEROSCOPY/BASKET;  Surgeon: Ky Barban, MD;  Location: [facility];  Service: Urology;  Laterality: Right;  . TONSILLECTOMY      Home Medications:  Current Facility-Administered Medications for the 04/09/17 encounter [facility] Encounter)  Medication  . rho (d) immune  globulin (RHIG/RHOPHYLAC) injection 300 mcg   No outpatient medications have been marked as taking for the 04/09/17 encounter [facility] Encounter).    Allergies: No Known Allergies  Family History  Problem Relation Age of Onset  . Hyperlipidemia Mother   . Hypertension Father   . Cancer Maternal Grandmother        lung  . Cancer Maternal Grandfather        bone  . Diabetes Paternal Grandfather   . Hypertension Paternal Grandmother     Social History:  reports that  has never smoked. she has never used smokeless tobacco. She reports that she does not drink alcohol or use drugs.  ROS: A complete review of systems was performed.  All systems are negative except for pertinent findings as noted.  Physical Exam:  Vital signs in last 24 hours: Temp:  [97.8 F (36.6 C)-98 F (36.7 C)] 98 F (36.7 C) (03/03 1244) Pulse Rate:  [60-108] 108 (03/03 1717) Resp:  [16-24] 16 (03/03 0805) BP: (120-134)/(66-93) 126/66 (03/03 1717) SpO2:  [95 %-100 %] 97 % (03/03 1415) Constitutional:  Alert and oriented, No acute distress HEENT: [place] AT, moist mucus membranes.  Trachea midline Cardiovascular: Regular rate and rhythm, no clubbing, cyanosis, or edema. Respiratory: Normal respiratory effort, lungs clear bilaterally Neurologic: Grossly intact, no focal deficits, moving all 4 extremities Psychiatric: Normal mood and affect   Laboratory Data:  Recent Labs    04/09/17 0836  WBC 11.9*  HGB 9.8*  HCT 30.0*   Recent Labs    04/09/17 0836  NA 135  K 4.1  CL 105  CO2 18*  GLUCOSE 106*  BUN 9  CREATININE 0.67  CALCIUM 8.8*    Radiologic Imaging: [facility]  Result Date: 04/09/2017 CLINICAL DATA:  Left flank pain, [redacted] weeks pregnant EXAM: RENAL / URINARY TRACT ULTRASOUND COMPLETE COMPARISON:  CT abdomen/pelvis dated 03/02/2010 FINDINGS: Right Kidney: Length: 11.3 cm.  No mass or hydronephrosis. Left Kidney: Length: 13.5 cm.  Mild left hydronephrosis. Bladder: Not visualized/decompressed.  IMPRESSION: Mild left hydronephrosis. Electronically Signed   By: Charline BillsSriyesh  Krishnan M.D.   On: 04/09/2017 08:12    Impression/Assessment:  27 year old female with IUP at 35 weeks with acute onset of left flank pain.  She has mild hydronephrosis on renal ultrasound.  Urinalysis does show microhematuria however it is a contaminated specimen.  She may have a ureteral calculus accounting for her symptoms.  Her pain is currently controlled on oral analgesics.    Recommendation: Continue oral analgesics and tamsulosin and recommend straining urine.  We discussed ureteroscopy and stent placement for worsening or severe symptoms.  If her pain is controlled and she no longer requires obstetric monitoring can be discharged for outpatient management and will be happy to follow-up in our office.  Will sign off however should her clinical symptoms worsen please call.  04/09/2017, 7:13 PM  Irineo AxonScott Stoioff,  MD   Thank you for involving me in this patient's care.     Scott C Stoioff

## 2017-04-09 NOTE — Progress Notes (Signed)
OB Triage Note  Jade Palmer is a 27 y.o. G2P0101 with IUP at [redacted]w[redacted]d presenting with colicky or intermittent left flank pain that radiated to her front since 0400 this morning.   Patient states she has been having  none contractions, none vaginal bleeding, intact membranes, with active fetal movement.    Denies difficulty breathing or respiratory distress, chest pain, dysuria, and leg pain or swelling.   Prenatal Course  Source of Care: [facility]   Pregnancy complications or risks: history of pre-eclampsia, history of kidney stones, history of preterm birth, anemia in pregnancy, Rh negative  Prenatal labs and studies:  ABO, Rh: O/Negative/-- 10-30-22 1129)  Antibody: Negative 2022/10/30 1129)  Rubella: 5.08 10-30-2022 1129)  Varicella: 171 30-Oct-2022 1129)  RPR: Non Reactive (01/10 1043)   HBsAg: Negative Oct 30, 2022 1129)   HIV: Non Reactive 10-30-22 1129)   GBS: To be collected  1 hr Glucola: 100 (01/10 1043)  Genetic screening: Low risk female (10-29-2016 1129)  Anatomy US: Complete (12/23/2016 1522)  Past Medical History:  Diagnosis Date  . Contraceptive education 04/12/2013  . History of kidney stones   . Seizures (HCC)    had seizure at age 41. Unknown etiology- no meds for this in about 5 years    Past Surgical History:  Procedure Laterality Date  . BALLOON DILATION Right 04/25/2013   Procedure: RIGHT URETER BALLOON DILATION;  Surgeon: Ky Barban, MD;  Location: [facility];  Service: Urology;  Laterality: Right;  . CYSTOSCOPY/RETROGRADE/URETEROSCOPY/STONE EXTRACTION WITH BASKET Right 04/25/2013   Procedure: CYSTOSCOPY/RIGHT RETROGRADE/RIGHT URETEROSCOPY/BASKET;  Surgeon: Ky Barban, MD;  Location: [facility];  Service: Urology;  Laterality: Right;  . TONSILLECTOMY      OB History  Gravida Para Term Preterm AB Living  2 1   1   1   SAB TAB Ectopic Multiple Live Births          1    # Outcome Date GA Lbr Len/2nd Weight Sex Delivery Anes PTL Lv  2 Current           1 Preterm  05/04/12 [redacted]w[redacted]d / 03:39 6 lb (2.722 kg) F Vag-Spont EPI  LIV      Social History   Socioeconomic History  . Marital status: Legally Separated    Spouse name: None  . Number of children: None  . Years of education: None  . Highest education level: None  Social Needs  . Financial resource strain: None  . Food insecurity - worry: None  . Food insecurity - inability: None  . Transportation needs - medical: None  . Transportation needs - non-medical: None  Occupational History  . None  Tobacco Use  . Smoking status: Never Smoker  . Smokeless tobacco: Never Used  Substance and Sexual Activity  . Alcohol use: No    Comment: rarely  . Drug use: No  . Sexual activity: Yes    Birth control/protection: Condom  Other Topics Concern  . None  Social History Narrative  . None    Family History  Problem Relation Age of Onset  . Hyperlipidemia Mother   . Hypertension Father   . Cancer Maternal Grandmother        lung  . Cancer Maternal Grandfather        bone  . Diabetes Paternal Grandfather   . Hypertension Paternal Grandmother     Facility-Administered Medications Prior to Admission  Medication Dose Route Frequency Provider Last Rate Last Dose  . rho (d) immune globulin (RHIG/RHOPHYLAC) injection 300  mcg  300 mcg Intramuscular Once Gunnar BullaLawhorn, Shakari Qazi Michelle, CNM       Medications Prior to Admission  Medication Sig Dispense Refill Last Dose  . aspirin EC 81 MG tablet Take 1 tablet (81 mg total) by mouth daily. Take after 12 weeks for prevention of preeclampssia later in pregnancy 300 tablet 2 Taking  . Iron-FA-B Cmp-C-Biot-Probiotic (FUSION PLUS) CAPS Take 1 tablet by mouth daily. 30 capsule 6 Taking  . Prenatal Vit-Fe Fumarate-FA (PRENATAL MULTIVITAMIN) TABS tablet Take 1 tablet by mouth daily at 12 noon.   Taking    No Known Allergies  Review of Systems: Negative except for what is mentioned in HPI.  Physical Exam:  Temp:  [97.8 F (36.6 C)-98 F (36.7 C)] 98 F  (36.7 C) (03/03 1244) Pulse Rate:  [60-72] 63 (03/03 1244) Resp:  [16-24] 16 (03/03 0805) BP: (120-134)/(66-93) 120/66 (03/03 1244) SpO2:  [96 %-100 %] 100 % (03/03 1250)  GENERAL: Well-developed, well-nourished female in no acute distress.   LUNGS: Clear to auscultation bilaterally.   HEART: Regular rate and rhythm.  ABDOMEN: Soft, nontender, nondistended, gravid.  EXTREMITIES: Nontender, no edema, 2+ distal pulses.  Cervical Exam:    FHT:  Baseline rate 110 bpm   Variability moderate  Accelerations present   Decelerations none  Contractions: Irritability, soft resting tone   Pertinent Labs/Studies:    Results for orders placed or performed during the hospital encounter of 04/09/17 (from the past 24 hour(s))  Urinalysis, Routine w reflex microscopic     Status: Abnormal   Collection Time: 04/09/17  6:47 AM  Result Value Ref Range   Color, Urine YELLOW (A) YELLOW   APPearance CLOUDY (A) CLEAR   Specific Gravity, Urine 1.017 1.005 - 1.030   pH 5.0 5.0 - 8.0   Glucose, UA NEGATIVE NEGATIVE mg/dL   Hgb urine dipstick LARGE (A) NEGATIVE   Bilirubin Urine NEGATIVE NEGATIVE   Ketones, ur NEGATIVE NEGATIVE mg/dL   Protein, ur 30 (A) NEGATIVE mg/dL   Nitrite NEGATIVE NEGATIVE   Leukocytes, UA SMALL (A) NEGATIVE   RBC / HPF TOO NUMEROUS TO COUNT 0 - 5 RBC/hpf   WBC, UA 6-30 0 - 5 WBC/hpf   Bacteria, UA RARE (A) NONE SEEN   Squamous Epithelial / LPF 6-30 (A) NONE SEEN   Mucus PRESENT   CBC     Status: Abnormal   Collection Time: 04/09/17  8:36 AM  Result Value Ref Range   WBC 11.9 (H) 3.6 - 11.0 K/uL   RBC 3.46 (L) 3.80 - 5.20 MIL/uL   Hemoglobin 9.8 (L) 12.0 - 16.0 g/dL   HCT 16.130.0 (L) 09.635.0 - 04.547.0 %   MCV 86.7 80.0 - 100.0 fL   MCH 28.4 26.0 - 34.0 pg   MCHC 32.7 32.0 - 36.0 g/dL   RDW 40.915.2 (H) 81.111.5 - 91.414.5 %   Platelets 179 150 - 440 K/uL  Comprehensive metabolic panel     Status: Abnormal   Collection Time: 04/09/17  8:36 AM  Result Value Ref Range   Sodium 135 135  - 145 mmol/L   Potassium 4.1 3.5 - 5.1 mmol/L   Chloride 105 101 - 111 mmol/L   CO2 18 (L) 22 - 32 mmol/L   Glucose, Bld 106 (H) 65 - 99 mg/dL   BUN 9 6 - 20 mg/dL   Creatinine, Ser 7.820.67 0.44 - 1.00 mg/dL   Calcium 8.8 (L) 8.9 - 10.3 mg/dL   Total Protein 6.9 6.5 - 8.1 g/dL  Albumin 3.1 (L) 3.5 - 5.0 g/dL   AST 24 15 - 41 U/L   ALT 10 (L) 14 - 54 U/L   Alkaline Phosphatase 93 38 - 126 U/L   Total Bilirubin 0.6 0.3 - 1.2 mg/dL   GFR calc non Af Amer >60 >60 mL/min   GFR calc Af Amer >60 >60 mL/min   Anion gap 12 5 - 15    Assessment : Jade Palmer is a 27 y.o. G2P0101 at [redacted]w[redacted]d being observed for left sided flank pain, possibly kidney stone, Rh negative, GBS unknown  FHR Category I  Plan:  Patient reports intermittent relief with IV/IM pain medications.   Urology consult by Dr. Lonna Cobb. Advised pain management, oral hydration instead of IVF, and addition of Flomax daily.   Will check cervix and collect GBS culture due to history of preterm birth.   Reviewed red flag symptoms and when to call.   Continue orders as written. Reassess as needed.    Gunnar Bulla, CNM [facility]

## 2017-04-09 NOTE — OB Triage Note (Signed)
Pt present to L&D with 10/10 pain in her L side, radiating from front to back intermittently. Pt denies decreased fetal movement, vaginal bleeding, or leaking of fluid. Toco and EFM applied and explained. Will monitor closely.

## 2017-04-10 DIAGNOSIS — Z3A35 35 weeks gestation of pregnancy: Secondary | ICD-10-CM | POA: Diagnosis not present

## 2017-04-10 DIAGNOSIS — R3129 Other microscopic hematuria: Secondary | ICD-10-CM | POA: Diagnosis not present

## 2017-04-10 DIAGNOSIS — N1330 Unspecified hydronephrosis: Secondary | ICD-10-CM | POA: Diagnosis not present

## 2017-04-10 DIAGNOSIS — O9989 Other specified diseases and conditions complicating pregnancy, childbirth and the puerperium: Secondary | ICD-10-CM | POA: Diagnosis not present

## 2017-04-10 LAB — URINE CULTURE

## 2017-04-10 MED ORDER — ACETAMINOPHEN 500 MG PO TABS: 1000.0000 mg | ORAL_TABLET | Freq: Four times a day (QID) | ORAL | 0 refills | Status: DC | PRN

## 2017-04-10 MED ORDER — CYANOCOBALAMIN 1000 MCG/ML IJ SOLN: 1000.0000 ug | INTRAMUSCULAR | 0 refills | Status: DC

## 2017-04-10 MED ORDER — HYDROMORPHONE HCL 2 MG PO TABS: 2.0000 mg | ORAL_TABLET | ORAL | 0 refills | Status: DC | PRN

## 2017-04-10 MED ORDER — HYDROMORPHONE HCL 2 MG PO TABS
2.0000 mg | ORAL_TABLET | ORAL | Status: DC | PRN
  Administered 2017-04-10: 2 mg via ORAL
  Filled 2017-04-10: qty 1

## 2017-04-10 MED ORDER — ACETAMINOPHEN 500 MG PO TABS: 1000.0000 mg | ORAL_TABLET | Freq: Four times a day (QID) | ORAL | Status: DC | PRN

## 2017-04-10 NOTE — Discharge Summary (Signed)
                            Discharge Summary  Date of Admission: 04/09/2017  Date of Discharge: 04/10/2017  Admitting Diagnosis: Left flank pain at 9031w4d  Discharge Diagnosis: hydronephroisis    Intrapartum Procedures: urology consult                        Discharge Day SOAP Note:     Progress Note -   Subjective  Jade Palmer is a 27 y.o. 362P0101 female at 1231w4d, EDD Estimated Date of Delivery: 05/11/17 who presented to triage yesterday with complaints of left flank pain. She has been seen by urology and is noted to have mild hydronephrosis. Stent is not recommended at this time. She complains that the percocet is not helping the pain and is concerned that she will go home and have to come back for pain.   Objective  Vital signs: BP 119/71 (BP Location: Right Arm)   Pulse 85   Temp 98.1 F (36.7 C) (Oral)   Resp 18   LMP 08/04/2016   SpO2 97%   Physical Exam: Mild CVA tenderness left side Lungs clear bilaterally Heart: RRR   Data Review Labs: CBC Latest Ref Rng & Units 04/09/2017 04/03/2017 03/20/2017  WBC 3.6 - 11.0 K/uL 11.9(H) 9.8 10.4  Hemoglobin 12.0 - 16.0 g/dL 1.6(X9.8(L) 0.9(U9.3(L) 0.4(V9.1(L)  Hematocrit 35.0 - 47.0 % 30.0(L) 27.9(L) 27.8(L)  Platelets 150 - 440 K/uL 179 200 197   O  Assessment/Plan  Active Problems:   Anemia in pregnancy   Indication for care in labor or delivery   History of preterm delivery, currently pregnant   History of kidney stones    Plan for discharge today.   Discharge Instructions: Per After Visit Summary. Activity: Advance as tolerated. Diet: Regular Medications:  Dilaudid 2 mg every 4-6 hr as need for pain. Tylenol 1000 mg every 6 hr as needed for pain.    Outpatient follow up:   Discharged Condition: good  Discharged to: home,   Follow up in office as scheduled.   Doreene Burkennie Tymier Lindholm, CNM  04/10/2017 5:37 PM

## 2017-04-10 NOTE — Progress Notes (Signed)
Provided and reviewed discharge paperwork and prescriptions and note for work. Pt and significant other verbalized understanding on opioid use, flank pain, and kidney stones as well as reasons to come back to the hospital. Pt rates pain at this time as 3/10 to left flank intermittent with activity. VS stable. FHR reassuring with NST. Pt discharged via wheelchair to visitor entrance for her husband to transport her home.

## 2017-04-10 NOTE — Progress Notes (Signed)
Notified M. Lawhorn, CNM regarding patient with uncontrolled, 10/10, L flank pain after multiple interventions (see MAR). CNM encouraged continued oral hydration and position change at this time. Will continue to monitor.

## 2017-04-10 NOTE — Progress Notes (Signed)
   L&D OB Triage Note  SUBJECTIVE Jade Palmer is a 27 y.o. 182P0101 female at 673w4d, EDD Estimated Date of Delivery: 05/11/17 who presented to triage yesterday with complaints of left flank pain. She has been seen by urology and is noted to have mild hydronephrosis. Stent is not recommended at this time. She complains that the percocet is not helping the pain and is concerned that she will go home and have to come back for pain.   Obstetric History   G2   P1   T0   P1   A0   L1    SAB0   TAB0   Ectopic0   Multiple0   Live Births1     # Outcome Date GA Lbr Len/2nd Weight Sex Delivery Anes PTL Lv  2 Current           1 Preterm 05/04/12 5248w6d / 03:39 6 lb (2.722 kg) F Vag-Spont EPI  LIV     Name: Jade Palmer     Apgar1:  8                Apgar5: 9      Facility-Administered Medications Prior to Admission  Medication Dose Route Frequency Provider Last Rate Last Dose  . rho (d) immune globulin (RHIG/RHOPHYLAC) injection 300 mcg  300 mcg Intramuscular Once Gunnar BullaLawhorn, Jenkins Michelle, CNM       Medications Prior to Admission  Medication Sig Dispense Refill Last Dose  . aspirin EC 81 MG tablet Take 1 tablet (81 mg total) by mouth daily. Take after 12 weeks for prevention of preeclampssia later in pregnancy 300 tablet 2 Taking  . Iron-FA-B Cmp-C-Biot-Probiotic (FUSION PLUS) CAPS Take 1 tablet by mouth daily. 30 capsule 6 Taking  . Prenatal Vit-Fe Fumarate-FA (PRENATAL MULTIVITAMIN) TABS tablet Take 1 tablet by mouth daily at 12 noon.   Taking     OBJECTIVE  Nursing Evaluation:   BP (!) 106/57 (BP Location: Right Arm)   Pulse 83   Temp 98.2 F (36.8 C) (Oral)   Resp 18   LMP 08/04/2016   SpO2 97%    ASSESSMENT Impression:  1.  Pregnancy:  G2P0101 at 603w4d , EDD Estimated Date of Delivery: 05/11/17 2.  NST:  Category I  3. Mild hydronephrosis possible calculus   PLAN 1. Reassurance given 2. Discharge home with standard labor precautions given to return to L&D or call the office  for problems. Pain medications changed from percocet to Dilaudid and tylenol per consult with Dr. Andi Devonefranacesco. Pt comfortable with plan  3. Continue routine prenatal care.

## 2017-04-11 ENCOUNTER — Other Ambulatory Visit: Payer: Self-pay

## 2017-04-11 ENCOUNTER — Observation Stay: Payer: No Typology Code available for payment source

## 2017-04-11 ENCOUNTER — Inpatient Hospital Stay
Admission: EM | Admit: 2017-04-11 | Discharge: 2017-04-12 | DRG: 818 | Disposition: A | Discharge status: Home / Self Care | Payer: No Typology Code available for payment source | Attending: Obstetrics and Gynecology | Admitting: Obstetrics and Gynecology

## 2017-04-11 DIAGNOSIS — O09213 Supervision of pregnancy with history of pre-term labor, third trimester: Secondary | ICD-10-CM

## 2017-04-11 DIAGNOSIS — Z3A35 35 weeks gestation of pregnancy: Secondary | ICD-10-CM

## 2017-04-11 DIAGNOSIS — O99013 Anemia complicating pregnancy, third trimester: Secondary | ICD-10-CM | POA: Diagnosis present

## 2017-04-11 DIAGNOSIS — D649 Anemia, unspecified: Secondary | ICD-10-CM | POA: Diagnosis present

## 2017-04-11 DIAGNOSIS — N132 Hydronephrosis with renal and ureteral calculous obstruction: Secondary | ICD-10-CM | POA: Diagnosis present

## 2017-04-11 DIAGNOSIS — N200 Calculus of kidney: Secondary | ICD-10-CM

## 2017-04-11 DIAGNOSIS — R109 Unspecified abdominal pain: Secondary | ICD-10-CM

## 2017-04-11 DIAGNOSIS — O26839 Pregnancy related renal disease, unspecified trimester: Secondary | ICD-10-CM

## 2017-04-11 DIAGNOSIS — N202 Calculus of kidney with calculus of ureter: Secondary | ICD-10-CM | POA: Diagnosis not present

## 2017-04-11 DIAGNOSIS — O26833 Pregnancy related renal disease, third trimester: Principal | ICD-10-CM | POA: Diagnosis present

## 2017-04-11 HISTORY — DX: Pregnancy related renal disease, unspecified trimester: O26.839

## 2017-04-11 HISTORY — DX: Calculus of kidney: N20.0

## 2017-04-11 LAB — URINALYSIS, ROUTINE W REFLEX MICROSCOPIC
Bilirubin Urine: NEGATIVE
Glucose, UA: NEGATIVE mg/dL
Ketones, ur: NEGATIVE mg/dL
Nitrite: NEGATIVE
PROTEIN: NEGATIVE mg/dL
SPECIFIC GRAVITY, URINE: 1.018 (ref 1.005–1.030)
pH: 6 (ref 5.0–8.0)

## 2017-04-11 LAB — CULTURE, BETA STREP (GROUP B ONLY)

## 2017-04-11 LAB — CBC
HEMATOCRIT: 26 % — AB (ref 35.0–47.0)
HEMOGLOBIN: 8.7 g/dL — AB (ref 12.0–16.0)
MCH: 28.9 pg (ref 26.0–34.0)
MCHC: 33.4 g/dL (ref 32.0–36.0)
MCV: 86.7 fL (ref 80.0–100.0)
Platelets: 168 10*3/uL (ref 150–440)
RBC: 2.99 MIL/uL — AB (ref 3.80–5.20)
RDW: 15.5 % — AB (ref 11.5–14.5)
WBC: 9.1 10*3/uL (ref 3.6–11.0)

## 2017-04-11 MED ORDER — HYDROMORPHONE HCL 1 MG/ML IJ SOLN
1.0000 mg | Freq: Once | INTRAMUSCULAR | Status: AC
  Administered 2017-04-11: 1 mg via INTRAVENOUS
  Filled 2017-04-11: qty 1

## 2017-04-11 MED ORDER — CYANOCOBALAMIN 1000 MCG/ML IJ SOLN
1000.0000 ug | Freq: Once | INTRAMUSCULAR | Status: AC
  Administered 2017-04-11: 1000 ug via INTRAMUSCULAR
  Filled 2017-04-11: qty 1

## 2017-04-11 MED ORDER — SOD CITRATE-CITRIC ACID 500-334 MG/5ML PO SOLN
30.0000 mL | ORAL | Status: DC | PRN
  Administered 2017-04-12: 30 mL via ORAL
  Filled 2017-04-11: qty 15

## 2017-04-11 MED ORDER — ONDANSETRON HCL 4 MG/2ML IJ SOLN
4.0000 mg | Freq: Four times a day (QID) | INTRAMUSCULAR | Status: DC | PRN
Start: 2017-04-11 — End: 2017-04-12
  Administered 2017-04-11 – 2017-04-12 (×3): 4 mg via INTRAVENOUS
  Filled 2017-04-11 (×3): qty 2

## 2017-04-11 MED ORDER — MORPHINE SULFATE (PF) 4 MG/ML IV SOLN
5.0000 mg | Freq: Once | INTRAVENOUS | Status: AC
  Administered 2017-04-11: 5 mg via INTRAMUSCULAR
  Filled 2017-04-11: qty 2

## 2017-04-11 MED ORDER — MORPHINE SULFATE (PF) 2 MG/ML IV SOLN
2.0000 mg | Freq: Once | INTRAVENOUS | Status: AC
  Administered 2017-04-11: 2 mg via INTRAVENOUS
  Filled 2017-04-11: qty 1

## 2017-04-11 MED ORDER — PRENATAL MULTIVITAMIN CH: 1.0000 | ORAL_TABLET | Freq: Every day | ORAL | Status: DC

## 2017-04-11 MED ORDER — MORPHINE SULFATE (PF) 2 MG/ML IV SOLN
INTRAVENOUS | Status: AC
  Administered 2017-04-11: 2 mg via INTRAVENOUS
  Filled 2017-04-11: qty 1

## 2017-04-11 MED ORDER — SODIUM CHLORIDE 0.9% FLUSH: 9.0000 mL | INTRAVENOUS | Status: DC | PRN

## 2017-04-11 MED ORDER — NALOXONE HCL 0.4 MG/ML IJ SOLN: 0.4000 mg | INTRAMUSCULAR | Status: DC | PRN

## 2017-04-11 MED ORDER — ACETAMINOPHEN 500 MG PO TABS
1000.0000 mg | ORAL_TABLET | Freq: Four times a day (QID) | ORAL | Status: DC | PRN
  Administered 2017-04-11: 1000 mg via ORAL
  Filled 2017-04-11: qty 2

## 2017-04-11 MED ORDER — ONDANSETRON HCL 4 MG/2ML IJ SOLN: 4.0000 mg | Freq: Four times a day (QID) | INTRAMUSCULAR | Status: DC

## 2017-04-11 MED ORDER — CALCIUM CARBONATE ANTACID 500 MG PO CHEW: 2.0000 | CHEWABLE_TABLET | ORAL | Status: DC | PRN

## 2017-04-11 MED ORDER — LACTATED RINGERS IV SOLN: 500.0000 mL | INTRAVENOUS | Status: DC | PRN

## 2017-04-11 MED ORDER — LACTATED RINGERS IV BOLUS (SEPSIS)
1000.0000 mL | Freq: Once | INTRAVENOUS | Status: AC
  Administered 2017-04-11: 1000 mL via INTRAVENOUS

## 2017-04-11 MED ORDER — PROMETHAZINE HCL 25 MG/ML IJ SOLN
12.5000 mg | Freq: Four times a day (QID) | INTRAMUSCULAR | Status: DC | PRN
  Administered 2017-04-11: 12.5 mg via INTRAVENOUS
  Filled 2017-04-11: qty 1

## 2017-04-11 MED ORDER — DOCUSATE SODIUM 100 MG PO CAPS: 100.0000 mg | ORAL_CAPSULE | Freq: Every day | ORAL | Status: DC

## 2017-04-11 MED ORDER — LACTATED RINGERS IV SOLN
INTRAVENOUS | Status: DC
  Administered 2017-04-11 – 2017-04-12 (×4): via INTRAVENOUS

## 2017-04-11 MED ORDER — HYDROMORPHONE 1 MG/ML IV SOLN
INTRAVENOUS | Status: DC
  Administered 2017-04-11: 2.4 mg via INTRAVENOUS
  Administered 2017-04-11: 3 mg via INTRAVENOUS
  Administered 2017-04-11: 25 mg via INTRAVENOUS
  Administered 2017-04-12: 5.7 mg via INTRAVENOUS
  Administered 2017-04-12: 3.9 mg via INTRAVENOUS
  Filled 2017-04-11: qty 25

## 2017-04-11 MED ORDER — DIPHENHYDRAMINE HCL 12.5 MG/5ML PO ELIX
12.5000 mg | ORAL_SOLUTION | Freq: Four times a day (QID) | ORAL | Status: DC | PRN
  Filled 2017-04-11: qty 5

## 2017-04-11 MED ORDER — HYDROMORPHONE HCL 2 MG PO TABS
2.0000 mg | ORAL_TABLET | ORAL | Status: DC | PRN
  Administered 2017-04-11 (×2): 2 mg via ORAL
  Filled 2017-04-11 (×2): qty 1

## 2017-04-11 MED ORDER — CEFAZOLIN SODIUM-DEXTROSE 2-4 GM/100ML-% IV SOLN
2.0000 g | INTRAVENOUS | Status: AC
  Administered 2017-04-12: 2 g via INTRAVENOUS
  Filled 2017-04-11: qty 100

## 2017-04-11 MED ORDER — DIPHENHYDRAMINE HCL 50 MG/ML IJ SOLN: 12.5000 mg | Freq: Four times a day (QID) | INTRAMUSCULAR | Status: DC | PRN

## 2017-04-11 MED ORDER — "SYRINGE/NEEDLE (DISP) 25G X 1-1/2"" 3 ML MISC": 1.0000 mL | 0 refills | Status: DC

## 2017-04-11 MED ORDER — MORPHINE SULFATE (PF) 2 MG/ML IV SOLN
2.0000 mg | Freq: Once | INTRAVENOUS | Status: AC
  Administered 2017-04-11: 1 mg via INTRAVENOUS
  Filled 2017-04-11: qty 1

## 2017-04-11 MED ORDER — PROMETHAZINE HCL 25 MG/ML IJ SOLN
12.5000 mg | Freq: Once | INTRAMUSCULAR | Status: AC
  Administered 2017-04-11: 12.5 mg via INTRAVENOUS
  Filled 2017-04-11: qty 1

## 2017-04-11 MED ORDER — TAMSULOSIN HCL 0.4 MG PO CAPS
0.4000 mg | ORAL_CAPSULE | Freq: Every day | ORAL | Status: DC
  Administered 2017-04-11: 0.4 mg via ORAL
  Filled 2017-04-11 (×2): qty 1

## 2017-04-11 NOTE — Progress Notes (Signed)
Pt present to BP in acute distress, states she was last  here on 3/4 and discharged Monday morning at 10a, returned to triage with same c/o, back pain, Lt side abd pain, nausea and vomiting, difficulty urinating and trouble taking a deep breath because of pain. Last took nausea med at yesterday evening at 5p and dilaudid 2mg  this morning at 0320a. Says she threw up medication after taking it to relieve pain. Phenergan at 0330a today. SVE with last visit yesterday, fingertip/50.

## 2017-04-11 NOTE — H&P (Signed)
History and Physical   HPI  Jade Palmer is a 27 y.o. G2P0101 at [redacted]w[redacted]d Estimated Date of Delivery: 05/11/17 who is being admitted for cystoscopy with left ureteroscopy and possible stent placement 04/12/17 if stone does not pass through the night.    OB History  Obstetric History   G2   P1   T0   P1   A0   L1    SAB0   TAB0   Ectopic0   Multiple0   Live Births1     # Outcome Date GA Lbr Len/2nd Weight Sex Delivery Anes PTL Lv  2 Current           1 Preterm 05/04/12 [redacted]w[redacted]d / 03:39 6 lb (2.722 kg) F Vag-Spont EPI  LIV     Name: Jade Palmer     Apgar1:  8                Apgar5: 9      PROBLEM LIST  Pregnancy complications or risks: Patient Active Problem List   Diagnosis Date Noted  . Labor and delivery, indication for care 04/11/2017  . Calculus of kidney affecting pregnancy 04/11/2017  . Indication for care in labor or delivery 04/09/2017  . History of preterm delivery, currently pregnant 04/09/2017  . History of kidney stones 04/09/2017  . Anemia in pregnancy 02/19/2017  . Nausea and vomiting during pregnancy prior to [redacted] weeks gestation 10/24/2016  . Pregnancy headache in first trimester 10/24/2016  . Urinary tract infection in mother during first trimester of pregnancy 10/24/2016  . Hx of preeclampsia, prior pregnancy, currently pregnant, first trimester 10/24/2016    Prenatal labs and studies: ABO, Rh: --/--/O NEG (03/05 1136) Antibody: POS (03/05 1136) Rubella: 5.08 (09/17 1129) RPR: Non Reactive (01/10 1043)  HBsAg: Negative (09/17 1129)  HIV: Non Reactive (09/17 1129)  GBS: unknown   Past Medical History:  Diagnosis Date  . Calculus of kidney affecting pregnancy 04/11/2017  . Contraceptive education 04/12/2013  . History of kidney stones   . Seizures (HCC)    had seizure at age 10. Unknown etiology- no meds for this in about 5 years     Past Surgical History:  Procedure Laterality Date  . BALLOON DILATION Right 04/25/2013   Procedure: RIGHT URETER  BALLOON DILATION;  Surgeon: Ky Barban, MD;  Location: [facility];  Service: Urology;  Laterality: Right;  . CYSTOSCOPY/RETROGRADE/URETEROSCOPY/STONE EXTRACTION WITH BASKET Right 04/25/2013   Procedure: CYSTOSCOPY/RIGHT RETROGRADE/RIGHT URETEROSCOPY/BASKET;  Surgeon: Ky Barban, MD;  Location: [facility];  Service: Urology;  Laterality: Right;  . TONSILLECTOMY       Medications    Current Discharge Medication List    START taking these medications   Details  SYRINGE-NEEDLE, DISP, 3 ML (B-D 3CC LUER-LOK SYR 25GX1/2") 25G X 1-1/2" 3 ML MISC 1 mL by Does not apply route every other day. Qty: 10 each, Refills: 0      CONTINUE these medications which have NOT CHANGED   Details  acetaminophen (TYLENOL) 500 MG tablet Take 2 tablets (1,000 mg total) by mouth every 6 (six) hours as needed for moderate pain. Qty: 30 tablet, Refills: 0    aspirin EC 81 MG tablet Take 1 tablet (81 mg total) by mouth daily. Take after 12 weeks for prevention of preeclampssia later in pregnancy Qty: 300 tablet, Refills: 2    cyanocobalamin (,VITAMIN B-12,) 1000 MCG/ML injection Inject 1 mL (1,000 mcg total) into the muscle once a week. Qty: 30 mL, Refills: 0  HYDROmorphone (DILAUDID) 2 MG tablet Take 1 tablet (2 mg total) by mouth every 4 (four) hours as needed for severe pain. Qty: 30 tablet, Refills: 0    Iron-FA-B Cmp-C-Biot-Probiotic (FUSION PLUS) CAPS Take 1 tablet by mouth daily. Qty: 30 capsule, Refills: 6    ondansetron (ZOFRAN) 4 MG tablet Take 1 tablet (4 mg total) by mouth every 6 (six) hours as needed for nausea or vomiting (try second). Qty: 20 tablet, Refills: 0    oxyCODONE-acetaminophen (PERCOCET/ROXICET) 5-325 MG tablet Take 1-2 tablets by mouth every 6 (six) hours as needed for moderate pain or severe pain. Qty: 30 tablet, Refills: 0    Prenatal Vit-Fe Fumarate-FA (PRENATAL MULTIVITAMIN) TABS tablet Take 1 tablet by mouth daily at 12 noon.    promethazine (PHENERGAN) 25 MG tablet  Take 1 tablet (25 mg total) by mouth every 6 (six) hours as needed for nausea or vomiting. Qty: 30 tablet, Refills: 0    tamsulosin (FLOMAX) 0.4 MG CAPS capsule Take 1 capsule (0.4 mg total) by mouth daily. Qty: 30 capsule, Refills: 0         Allergies  Patient has no known allergies.  Review of Systems  Constitutional: negative Eyes: negative Ears, nose, mouth, throat, and face: negative Respiratory: negative Cardiovascular: negative Gastrointestinal: negative Genitourinary:positive for hematuria and left flank pain with CVA tenderness Integument/breast: negative Hematologic/lymphatic: negative Musculoskeletal:negative Neurological: negative Behavioral/Psych: negative Endocrine: negative Allergic/Immunologic: negative    Physical Exam  BP 106/60 (BP Location: Left Arm)   Pulse 77   Temp 99.3 F (37.4 C) (Oral)   Resp 18   Ht 5\' 7"  (1.702 m)   Wt 225 lb (102.1 kg)   LMP 08/04/2016   SpO2 99%   BMI 35.24 kg/m   Lungs:  CTA B Cardio: RRR without M/R/G Abd: Soft, gravid, NT Presentation: cephalic EXT: No C/C/ 1+ Edema DTRs: 2+ B CERVIX: not evluated See Prenatal records for more detailed PE.     FHR:  NST reactive , category 1 tracing this morning.   Toco: Uterine Contractions: None   Test Results  Results for orders placed or performed during the hospital encounter of 04/11/17 (from the past 24 hour(s))  Urinalysis, Routine w reflex microscopic     Status: Abnormal   Collection Time: 04/11/17  4:55 AM  Result Value Ref Range   Color, Urine YELLOW (A) YELLOW   APPearance CLOUDY (A) CLEAR   Specific Gravity, Urine 1.018 1.005 - 1.030   pH 6.0 5.0 - 8.0   Glucose, UA NEGATIVE NEGATIVE mg/dL   Hgb urine dipstick SMALL (A) NEGATIVE   Bilirubin Urine NEGATIVE NEGATIVE   Ketones, ur NEGATIVE NEGATIVE mg/dL   Protein, ur NEGATIVE NEGATIVE mg/dL   Nitrite NEGATIVE NEGATIVE   Leukocytes, UA LARGE (A) NEGATIVE   RBC / HPF 6-30 0 - 5 RBC/hpf   WBC,  UA 6-30 0 - 5 WBC/hpf   Bacteria, UA RARE (A) NONE SEEN   Squamous Epithelial / LPF 6-30 (A) NONE SEEN   Mucus PRESENT    Ca Oxalate Crys, UA PRESENT   Type and screen [facility]     Status: None (Preliminary result)   Collection Time: 04/11/17 11:36 AM  Result Value Ref Range   ABO/RH(D) O NEG    Antibody Screen POS    Sample Expiration 04/14/2017    Antibody Identification PASSIVELY ACQUIRED ANTI-D    Unit Number [id number]    Blood Component Type RED CELLS,LR    Unit division 00  Status of Unit ALLOCATED    Transfusion Status OK TO TRANSFUSE    Crossmatch Result      COMPATIBLE Performed at [facility], [address] [place], [place] [address]    Unit Number [id number]    Blood Component Type RED CELLS,LR    Unit division 00    Status of Unit ALLOCATED    Transfusion Status OK TO TRANSFUSE    Crossmatch Result COMPATIBLE   CBC     Status: Abnormal   Collection Time: 04/11/17 11:36 AM  Result Value Ref Range   WBC 9.1 3.6 - 11.0 K/uL   RBC 2.99 (L) 3.80 - 5.20 MIL/uL   Hemoglobin 8.7 (L) 12.0 - 16.0 g/dL   HCT 78.2 (L) 95.6 - 21.3 %   MCV 86.7 80.0 - 100.0 fL   MCH 28.9 26.0 - 34.0 pg   MCHC 33.4 32.0 - 36.0 g/dL   RDW 08.6 (H) 57.8 - 46.9 %   Platelets 168 150 - 440 K/uL   Rh negative  Assessment   G2P0101 at [redacted]w[redacted]d Estimated Date of Delivery: 05/11/17  The fetus is reassuring.   Patient Active Problem List   Diagnosis Date Noted  . Labor and delivery, indication for care 04/11/2017  . Calculus of kidney affecting pregnancy 04/11/2017  . Indication for care in labor or delivery 04/09/2017  . History of preterm delivery, currently pregnant 04/09/2017  . History of kidney stones 04/09/2017  . Anemia in pregnancy 02/19/2017  . Nausea and vomiting during pregnancy prior to [redacted] weeks gestation 10/24/2016  . Pregnancy headache in first trimester 10/24/2016  . Urinary tract infection in mother during first trimester of  pregnancy 10/24/2016  . Hx of preeclampsia, prior pregnancy, currently pregnant, first trimester 10/24/2016    Plan  1. Admit to antepartum :  PCA Dilaudid for pain management of kidney stone 2. EFM:- Category 1 NST daily 3. cystoscopy with left ureteroscopy and possible stent placement in the morning 4. Admission labs completed 5. Discussed potential for cesarean section if fetus not tolerating procedure. Reviewed risks of cesarean section procedure.   Pt verbalizes understanding and agrees to plan. Dr. Greggory Keen has been and Dr. Lonna Cobb have been consulted on management of patient care.   Doreene Burke, CNM  04/11/2017 5:42 PM

## 2017-04-11 NOTE — Progress Notes (Addendum)
L&D OB Triage Note  SUBJECTIVE Jade Palmer is a 27 y.o. G46P0101 female at [redacted]w[redacted]d, EDD Estimated Date of Delivery: 05/11/17 who presented to triage with complaints of left flank pain that is 10/10 on pain scale. She has nausea and vomiting preventing her from keeping pain medications down. Patient was seen on Sunday and diagnosed mild hydronephrosis. She was seen by urology ( seen note for full report). The recommendation was to continue oral analgesics and tamsulosin and strain urine.  Ureteroscopy and stent placement for worsening or severe symptoms were discussed. PT went home Monday morning with PO medications. She returns this morning with the same complaints.     Obstetric History   G2   P1   T0   P1   A0   L1    SAB0   TAB0   Ectopic0   Multiple0   Live Births1     # Outcome Date GA Lbr Len/2nd Weight Sex Delivery Anes PTL Lv  2 Current           1 Preterm 05/04/12 [redacted]w[redacted]d / 03:39 6 lb (2.722 kg) F Vag-Spont EPI  LIV     Name: [name],GIRL Misty     Apgar1:  8                Apgar5: 9      Medications Prior to Admission  Medication Sig Dispense Refill Last Dose  . acetaminophen (TYLENOL) 500 MG tablet Take 2 tablets (1,000 mg total) by mouth every 6 (six) hours as needed for moderate pain. 30 tablet 0   . aspirin EC 81 MG tablet Take 1 tablet (81 mg total) by mouth daily. Take after 12 weeks for prevention of preeclampssia later in pregnancy 300 tablet 2 Taking  . cyanocobalamin (,VITAMIN B-12,) 1000 MCG/ML injection Inject 1 mL (1,000 mcg total) into the muscle once a week. 30 mL 0   . HYDROmorphone (DILAUDID) 2 MG tablet Take 1 tablet (2 mg total) by mouth every 4 (four) hours as needed for severe pain. 30 tablet 0   . Iron-FA-B Cmp-C-Biot-Probiotic (FUSION PLUS) CAPS Take 1 tablet by mouth daily. 30 capsule 6 Taking  . ondansetron (ZOFRAN) 4 MG tablet Take 1 tablet (4 mg total) by mouth every 6 (six) hours as needed for nausea or vomiting (try second). 20 tablet 0   .  oxyCODONE-acetaminophen (PERCOCET/ROXICET) 5-325 MG tablet Take 1-2 tablets by mouth every 6 (six) hours as needed for moderate pain or severe pain. 30 tablet 0   . Prenatal Vit-Fe Fumarate-FA (PRENATAL MULTIVITAMIN) TABS tablet Take 1 tablet by mouth daily at 12 noon.   Taking  . promethazine (PHENERGAN) 25 MG tablet Take 1 tablet (25 mg total) by mouth every 6 (six) hours as needed for nausea or vomiting. 30 tablet 0   . tamsulosin (FLOMAX) 0.4 MG CAPS capsule Take 1 capsule (0.4 mg total) by mouth daily. 30 capsule 0      OBJECTIVE  Nursing Evaluation:   BP 126/60 (BP Location: Left Arm)   Pulse 72   Temp 98 F (36.7 C) (Oral)   Resp 18   Ht 5\' 7"  (1.702 m)   Wt 225 lb (102.1 kg)   LMP 08/04/2016   BMI 35.24 kg/m    Findings:    NST was performed and has been reviewed by me.  NST INTERPRETATION: Category I  Mode: External Baseline Rate (A): 125 bpm Variability: Moderate Accelerations: 15 x 15 Decelerations: None     Contraction Frequency (  min): irritability  ASSESSMENT Impression:  1.  Pregnancy:  G2P0101 at 4265w5d , EDD Estimated Date of Delivery: 05/11/17 2.  NST:  Category I  3. Sever left flank pain  PLAN 1. Reassurance given, IV fluids and pain medications. Renal ultrasound , urology consult ordered. 2. Dr. Lonna CobbStoioff notified of consult request 3. Transfer pt to postpartum for pain medication management.  4.Dr. Defrancesco consulted on care plan.   I was present and evaluated the patient in person.  Doreene BurkeAnnie Tamicka Shimon, CNM

## 2017-04-11 NOTE — Progress Notes (Signed)
Pt to US via wheelchair.

## 2017-04-11 NOTE — Consult Note (Signed)
Urology Consult  I have been asked to see the patient by Doreene Burke, for evaluation and management of left flank pain secondary to a suspected stone.  Chief Complaint: Flank pain  History of Present Illness: Jade Palmer is a 27 y.o. year old with IUP at 36 weeks.  Refer to my previous consultation note of 04/09/2017.  She was discharged on 3/4 however failed outpatient management and return complaining of left flank pain radiating to the left lower quadrant.  Her pain is tolerable while supine however when she is upright she has onset of severe flank pain radiating to the left lower quadrant.  She does have a prior history of stone disease.  She is afebrile.  She is on tamsulosin.  Past Medical History:  Diagnosis Date  . Contraceptive education 04/12/2013  . History of kidney stones   . Seizures (HCC)    had seizure at age 40. Unknown etiology- no meds for this in about 5 years    Past Surgical History:  Procedure Laterality Date  . BALLOON DILATION Right 04/25/2013   Procedure: RIGHT URETER BALLOON DILATION;  Surgeon: Ky Barban, MD;  Location: [facility];  Service: Urology;  Laterality: Right;  . CYSTOSCOPY/RETROGRADE/URETEROSCOPY/STONE EXTRACTION WITH BASKET Right 04/25/2013   Procedure: CYSTOSCOPY/RIGHT RETROGRADE/RIGHT URETEROSCOPY/BASKET;  Surgeon: Ky Barban, MD;  Location: [facility];  Service: Urology;  Laterality: Right;  . TONSILLECTOMY      Home Medications:  No outpatient medications have been marked as taking for the 04/11/17 encounter [facility] Encounter).    Allergies: No Known Allergies  Family History  Problem Relation Age of Onset  . Hyperlipidemia Mother   . Hypertension Father   . Cancer Maternal Grandmother        lung  . Cancer Maternal Grandfather        bone  . Diabetes Paternal Grandfather   . Hypertension Paternal Grandmother     Social History:  reports that  has never smoked. she has never used smokeless tobacco. She reports that she  does not drink alcohol or use drugs.  ROS: A complete review of systems was performed.  All systems are negative except for pertinent findings as noted.  Physical Exam:  Vital signs in last 24 hours: Temp:  [97.5 F (36.4 C)-99.3 F (37.4 C)] 99.3 F (37.4 C) (03/05 1051) Pulse Rate:  [71-84] 77 (03/05 1616) Resp:  [18-20] 18 (03/05 1616) BP: (106-164)/(60-100) 106/60 (03/05 1616) SpO2:  [99 %-100 %] 99 % (03/05 1616) Weight:  [225 lb (102.1 kg)] 225 lb (102.1 kg) (03/05 0700) Constitutional:  Alert and oriented, No acute distress HEENT: [place] AT, moist mucus membranes.  Trachea midline, no masses Cardiovascular: Regular rate and rhythm, no clubbing, cyanosis, or edema. Respiratory: Normal respiratory effort, lungs clear bilaterally GU: No CVA tenderness Skin: No rashes, bruises or suspicious lesions Neurologic: Grossly intact, no focal deficits, moving all 4 extremities Psychiatric: Normal mood and affect   Laboratory Data:  Recent Labs    04/09/17 0836 04/11/17 1136  WBC 11.9* 9.1  HGB 9.8* 8.7*  HCT 30.0* 26.0*   Recent Labs    04/09/17 0836  NA 135  K 4.1  CL 105  CO2 18*  GLUCOSE 106*  BUN 9  CREATININE 0.67  CALCIUM 8.8*     Radiologic Imaging: US Renal  Result Date: 04/11/2017 CLINICAL DATA:  LEFT flank pain for 3 days, history of kidney stones, [redacted] weeks pregnant EXAM: RENAL / URINARY TRACT ULTRASOUND COMPLETE COMPARISON:  04/09/2017  FINDINGS: Right Kidney: Length: 11.4 cm. Normal cortical thickness and echogenicity. Minimal dilatation of the RIGHT renal collecting system increased since prior exam. No gross evidence of renal mass or shadowing calcification. Left Kidney: Length: 12.9 cm. Normal cortical thickness and echogenicity. Mild central LEFT renal collecting system dilatation again seen. No renal mass or shadowing calcification Bladder: Partially distended, without gross mass or wall thickening. 6 mm echogenic focus at LEFT bladder base could represent a  LEFT ureterovesical junction calculus. Bladder was not adequately visualized on the previous exam due to decompressed state. IMPRESSION: Mild LEFT hydronephrosis unchanged. 6 mm diameter echogenic focus at the LEFT bladder base, question LEFT ureterovesical junction calculus. 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 Electronically Signed   By: Ulyses SouthwardMark  Boles M.D.   On: 04/11/2017 10:55    Impression/Assessment:  Renal ultrasound performed today shows persistent mild left hydronephrosis however an echogenic focus is now noted in the vicinity of the left UVJ which was not seen on her most recent ultrasound.  This most likely represents a distal ureteral calculus.  The size was estimated at 6 mm however stone size estimated by ultrasound is not very accurate.  Plan:  We discussed options.  It appears she has a stone that has progressed to the UVJ however she has failed outpatient management.  We discussed a trial of passage.  Due to the need for IV narcotics Dr. Greggory Keenefrancesco would not recommend longer than 24 hour trial of passage.  Will tentatively schedule cystoscopy with left ureteroscopy and possible stent placement.  She was informed that if a stone is in the left distal ureter and there is a good chance it can be removed.  It is unlikely I advance the will be able to ureteroscope above the distal ureter due to her gravid uterus.  The need for a postoperative ureteral stent was discussed.  Potential risks were discussed including bleeding, infection and injury to the ureter.  Potential risks of anesthesia and preterm labor have been discussed by obstetrics.  She indicated all questions were answered and desires to proceed.  I discussed with anesthesia and she will need fetal heart monitoring preinduction and in the PACU.  04/11/2017, 4:41 PM  [name],  MD   Thank you for involving me in this patient's care, I will continue  to follow along. Please page with any further questions or concerns.  [name]

## 2017-04-11 NOTE — H&P (View-Only) (Signed)
Urology Consult  I have been asked to see the patient by Doreene Burke, for evaluation and management of left flank pain secondary to a suspected stone.  Chief Complaint: Flank pain  History of Present Illness: Jade Palmer is a 27 y.o. year old with IUP at 36 weeks.  Refer to my previous consultation note of 04/09/2017.  She was discharged on 3/4 however failed outpatient management and return complaining of left flank pain radiating to the left lower quadrant.  Her pain is tolerable while supine however when she is upright she has onset of severe flank pain radiating to the left lower quadrant.  She does have a prior history of stone disease.  She is afebrile.  She is on tamsulosin.  Past Medical History:  Diagnosis Date  . Contraceptive education 04/12/2013  . History of kidney stones   . Seizures (HCC)    had seizure at age 40. Unknown etiology- no meds for this in about 5 years    Past Surgical History:  Procedure Laterality Date  . BALLOON DILATION Right 04/25/2013   Procedure: RIGHT URETER BALLOON DILATION;  Surgeon: Ky Barban, MD;  Location: [facility];  Service: Urology;  Laterality: Right;  . CYSTOSCOPY/RETROGRADE/URETEROSCOPY/STONE EXTRACTION WITH BASKET Right 04/25/2013   Procedure: CYSTOSCOPY/RIGHT RETROGRADE/RIGHT URETEROSCOPY/BASKET;  Surgeon: Ky Barban, MD;  Location: [facility];  Service: Urology;  Laterality: Right;  . TONSILLECTOMY      Home Medications:  No outpatient medications have been marked as taking for the 04/11/17 encounter [facility] Encounter).    Allergies: No Known Allergies  Family History  Problem Relation Age of Onset  . Hyperlipidemia Mother   . Hypertension Father   . Cancer Maternal Grandmother        lung  . Cancer Maternal Grandfather        bone  . Diabetes Paternal Grandfather   . Hypertension Paternal Grandmother     Social History:  reports that  has never smoked. she has never used smokeless tobacco. She reports that she  does not drink alcohol or use drugs.  ROS: A complete review of systems was performed.  All systems are negative except for pertinent findings as noted.  Physical Exam:  Vital signs in last 24 hours: Temp:  [97.5 F (36.4 C)-99.3 F (37.4 C)] 99.3 F (37.4 C) (03/05 1051) Pulse Rate:  [71-84] 77 (03/05 1616) Resp:  [18-20] 18 (03/05 1616) BP: (106-164)/(60-100) 106/60 (03/05 1616) SpO2:  [99 %-100 %] 99 % (03/05 1616) Weight:  [225 lb (102.1 kg)] 225 lb (102.1 kg) (03/05 0700) Constitutional:  Alert and oriented, No acute distress HEENT: [place] AT, moist mucus membranes.  Trachea midline, no masses Cardiovascular: Regular rate and rhythm, no clubbing, cyanosis, or edema. Respiratory: Normal respiratory effort, lungs clear bilaterally GU: No CVA tenderness Skin: No rashes, bruises or suspicious lesions Neurologic: Grossly intact, no focal deficits, moving all 4 extremities Psychiatric: Normal mood and affect   Laboratory Data:  Recent Labs    04/09/17 0836 04/11/17 1136  WBC 11.9* 9.1  HGB 9.8* 8.7*  HCT 30.0* 26.0*   Recent Labs    04/09/17 0836  NA 135  K 4.1  CL 105  CO2 18*  GLUCOSE 106*  BUN 9  CREATININE 0.67  CALCIUM 8.8*     Radiologic Imaging: US Renal  Result Date: 04/11/2017 CLINICAL DATA:  LEFT flank pain for 3 days, history of kidney stones, [redacted] weeks pregnant EXAM: RENAL / URINARY TRACT ULTRASOUND COMPLETE COMPARISON:  04/09/2017  FINDINGS: Right Kidney: Length: 11.4 cm. Normal cortical thickness and echogenicity. Minimal dilatation of the RIGHT renal collecting system increased since prior exam. No gross evidence of renal mass or shadowing calcification. Left Kidney: Length: 12.9 cm. Normal cortical thickness and echogenicity. Mild central LEFT renal collecting system dilatation again seen. No renal mass or shadowing calcification Bladder: Partially distended, without gross mass or wall thickening. 6 mm echogenic focus at LEFT bladder base could represent a  LEFT ureterovesical junction calculus. Bladder was not adequately visualized on the previous exam due to decompressed state. IMPRESSION: Mild LEFT hydronephrosis unchanged. 6 mm diameter echogenic focus at the LEFT bladder base, question LEFT ureterovesical junction calculus. 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 Electronically Signed   By: Ulyses SouthwardMark  Boles M.D.   On: 04/11/2017 10:55    Impression/Assessment:  Renal ultrasound performed today shows persistent mild left hydronephrosis however an echogenic focus is now noted in the vicinity of the left UVJ which was not seen on her most recent ultrasound.  This most likely represents a distal ureteral calculus.  The size was estimated at 6 mm however stone size estimated by ultrasound is not very accurate.  Plan:  We discussed options.  It appears she has a stone that has progressed to the UVJ however she has failed outpatient management.  We discussed a trial of passage.  Due to the need for IV narcotics Dr. Greggory Keenefrancesco would not recommend longer than 24 hour trial of passage.  Will tentatively schedule cystoscopy with left ureteroscopy and possible stent placement.  She was informed that if a stone is in the left distal ureter and there is a good chance it can be removed.  It is unlikely I advance the will be able to ureteroscope above the distal ureter due to her gravid uterus.  The need for a postoperative ureteral stent was discussed.  Potential risks were discussed including bleeding, infection and injury to the ureter.  Potential risks of anesthesia and preterm labor have been discussed by obstetrics.  She indicated all questions were answered and desires to proceed.  I discussed with anesthesia and she will need fetal heart monitoring preinduction and in the PACU.  04/11/2017, 4:41 PM  Irineo AxonScott Stoioff,  MD   Thank you for involving me in this patient's care, I will continue  to follow along. Please page with any further questions or concerns.  Scott C Stoioff

## 2017-04-12 ENCOUNTER — Encounter
Admission: EM | Disposition: A | Discharge status: Home / Self Care | Payer: Self-pay | Source: Home / Self Care | Attending: Obstetrics and Gynecology

## 2017-04-12 ENCOUNTER — Inpatient Hospital Stay: Payer: No Typology Code available for payment source | Admitting: Anesthesiology

## 2017-04-12 ENCOUNTER — Encounter: Payer: Self-pay | Admitting: Anesthesiology

## 2017-04-12 DIAGNOSIS — N202 Calculus of kidney with calculus of ureter: Secondary | ICD-10-CM

## 2017-04-12 DIAGNOSIS — O09213 Supervision of pregnancy with history of pre-term labor, third trimester: Secondary | ICD-10-CM

## 2017-04-12 DIAGNOSIS — N132 Hydronephrosis with renal and ureteral calculous obstruction: Secondary | ICD-10-CM

## 2017-04-12 DIAGNOSIS — Z3A35 35 weeks gestation of pregnancy: Secondary | ICD-10-CM

## 2017-04-12 DIAGNOSIS — O26833 Pregnancy related renal disease, third trimester: Secondary | ICD-10-CM

## 2017-04-12 HISTORY — PX: URETEROSCOPY WITH HOLMIUM LASER LITHOTRIPSY: SHX6645

## 2017-04-12 HISTORY — PX: CYSTOSCOPY WITH STENT PLACEMENT: SHX5790

## 2017-04-12 SURGERY — CYSTOSCOPY, WITH STENT INSERTION
Anesthesia: General | Laterality: Left | Wound class: Clean Contaminated

## 2017-04-12 MED ORDER — ONDANSETRON HCL 4 MG/2ML IJ SOLN
INTRAMUSCULAR | Status: DC | PRN
  Administered 2017-04-12: 4 mg via INTRAVENOUS

## 2017-04-12 MED ORDER — FENTANYL CITRATE (PF) 100 MCG/2ML IJ SOLN: 25.0000 ug | INTRAMUSCULAR | Status: DC | PRN

## 2017-04-12 MED ORDER — FENTANYL CITRATE (PF) 250 MCG/5ML IJ SOLN
INTRAMUSCULAR | Status: AC
  Filled 2017-04-12: qty 5

## 2017-04-12 MED ORDER — PROPOFOL 500 MG/50ML IV EMUL
INTRAVENOUS | Status: AC
  Filled 2017-04-12: qty 50

## 2017-04-12 MED ORDER — PROPOFOL 10 MG/ML IV BOLUS
INTRAVENOUS | Status: DC | PRN
  Administered 2017-04-12: 200 mg via INTRAVENOUS

## 2017-04-12 MED ORDER — DEXAMETHASONE SODIUM PHOSPHATE 10 MG/ML IJ SOLN: INTRAMUSCULAR | Status: DC | PRN

## 2017-04-12 MED ORDER — FENTANYL CITRATE (PF) 100 MCG/2ML IJ SOLN
INTRAMUSCULAR | Status: DC | PRN
  Administered 2017-04-12: 50 ug via INTRAVENOUS

## 2017-04-12 MED ORDER — PROMETHAZINE HCL 25 MG/ML IJ SOLN
12.5000 mg | Freq: Once | INTRAMUSCULAR | Status: AC
  Administered 2017-04-12: 12.5 mg via INTRAVENOUS
  Filled 2017-04-12: qty 1

## 2017-04-12 MED ORDER — ONDANSETRON HCL 4 MG/2ML IJ SOLN: 4.0000 mg | Freq: Once | INTRAMUSCULAR | Status: DC | PRN

## 2017-04-12 MED ORDER — HYDROCODONE-ACETAMINOPHEN 5-325 MG PO TABS
1.0000 | ORAL_TABLET | ORAL | Status: DC | PRN
  Administered 2017-04-12: 1 via ORAL
  Administered 2017-04-12: 2 via ORAL
  Administered 2017-04-12: 1 via ORAL
  Filled 2017-04-12 (×2): qty 1
  Filled 2017-04-12: qty 2

## 2017-04-12 MED ORDER — LIDOCAINE HCL (CARDIAC) 20 MG/ML IV SOLN
INTRAVENOUS | Status: DC | PRN
  Administered 2017-04-12: 100 mg via INTRAVENOUS

## 2017-04-12 SURGICAL SUPPLY — 29 items
BASKET ZERO TIP 1.9FR (BASKET) ×3 IMPLANT
BRUSH SCRUB EZ  4% CHG (MISCELLANEOUS)
BRUSH SCRUB EZ 4% CHG (MISCELLANEOUS) IMPLANT
CATH URETL 5X70 OPEN END (CATHETERS) ×3 IMPLANT
CNTNR SPEC 2.5X3XGRAD LEK (MISCELLANEOUS) ×1
CONRAY 43 FOR UROLOGY 50M (MISCELLANEOUS) ×3 IMPLANT
CONT SPEC 4OZ STER OR WHT (MISCELLANEOUS) ×2
CONTAINER SPEC 2.5X3XGRAD LEK (MISCELLANEOUS) ×1 IMPLANT
GLOVE BIO SURGEON STRL SZ8 (GLOVE) ×3 IMPLANT
GLOVE BIOGEL PI IND STRL 8 (GLOVE) ×1 IMPLANT
GLOVE BIOGEL PI INDICATOR 8 (GLOVE) ×2
GOWN STANDARD XL  REUSABL (MISCELLANEOUS) ×3 IMPLANT
GOWN STRL REUS W/ TWL LRG LVL3 (GOWN DISPOSABLE) ×1 IMPLANT
GOWN STRL REUS W/TWL LRG LVL3 (GOWN DISPOSABLE) ×2
INFUSOR MANOMETER BAG 3000ML (MISCELLANEOUS) ×3 IMPLANT
KIT TURNOVER CYSTO (KITS) ×3 IMPLANT
PACK CYSTO AR (MISCELLANEOUS) ×3 IMPLANT
SENSORWIRE 0.038 NOT ANGLED (WIRE) ×3
SET CYSTO W/LG BORE CLAMP LF (SET/KITS/TRAYS/PACK) ×3 IMPLANT
SOL .9 NS 3000ML IRR  AL (IV SOLUTION) ×2
SOL .9 NS 3000ML IRR UROMATIC (IV SOLUTION) ×1 IMPLANT
STENT URET 6FRX24 CONTOUR (STENTS) IMPLANT
STENT URET 6FRX26 CONTOUR (STENTS) IMPLANT
SURGILUBE 2OZ TUBE FLIPTOP (MISCELLANEOUS) ×3 IMPLANT
SYR 10ML LL (SYRINGE) ×3 IMPLANT
SYRINGE IRR TOOMEY STRL 70CC (SYRINGE) ×3 IMPLANT
WATER STERILE IRR 1000ML POUR (IV SOLUTION) ×3 IMPLANT
WATER STERILE IRR 3000ML UROMA (IV SOLUTION) ×3 IMPLANT
WIRE SENSOR 0.038 NOT ANGLED (WIRE) ×1 IMPLANT

## 2017-04-12 NOTE — Progress Notes (Signed)
Spoke to Dr. Lonna CobbStoioff, OK for patient to be discharged since pain is now under control. Will update CNM.

## 2017-04-12 NOTE — Anesthesia Procedure Notes (Signed)
Procedure Name: LMA Insertion Date/Time: 04/12/2017 8:23 AM Performed by: Paulette BlanchParas, Minela Bridge[place], CRNA Pre-anesthesia Checklist: Patient identified, Patient being monitored, Timeout performed, Emergency Drugs available and Suction available Patient Re-evaluated:Patient Re-evaluated prior to induction Oxygen Delivery Method: Circle system utilized Preoxygenation: Pre-oxygenation with 100% oxygen Induction Type: IV induction Ventilation: Mask ventilation without difficulty LMA: LMA inserted LMA Size: 4.0 Tube type: Oral Number of attempts: 1 Placement Confirmation: positive ETCO2 and breath sounds checked- equal and bilateral Tube secured with: Tape Dental Injury: Teeth and Oropharynx as per pre-operative assessment

## 2017-04-12 NOTE — Progress Notes (Signed)
Antepartum Note Danella Maiersshley M AVWUJ 81[name] 27 y.o.GP@ at [id number] status post cystoscopy ( see urology note for full details)  SUBJECTIVE:  Feeling much better. She has bladder spasms but other wise is doing well on percocet.  OBJECTIVE:  BP 134/76 (BP Location: Right Arm)   Pulse 97   Temp 98 F (36.7 C) (Oral)   Resp 18   Ht 5\' 7"  (1.702 m)   Wt 225 lb (102.1 kg)   LMP 08/04/2016   SpO2 98%   BMI 35.24 kg/m  Total I/O In: 400 [I.V.:400] Out: 425 [Urine:425]  FHT monitored during procedure. NST after was reactive, category 1 Baseline 130's with accelerations to 170's. Moderate variability , negative decelerations.  Labs: Lab Results  Component Value Date   WBC 9.1 04/11/2017   HGB 8.7 (L) 04/11/2017   HCT 26.0 (L) 04/11/2017   MCV 86.7 04/11/2017   PLT 168 04/11/2017    ASSESSMENT Calculus of kidney affecting pregnancy   PLAN: If pt tolerating PO , voiding without difficulty , and pain is under control plan for discharge this evening.   Doreene Burkennie Kellyanne Ellwanger, CNM  04/12/2017 11:58 AM

## 2017-04-12 NOTE — Interval H&P Note (Signed)
History and Physical Interval Note:  04/12/2017 8:03 AM  [name]  has presented today for surgery, with the diagnosis of n/a  The various methods of treatment have been discussed with the patient and family. After consideration of risks, benefits and other options for treatment, the patient has consented to  Procedure(s): CYSTOSCOPY WITH STENT PLACEMENT (Left) URETEROSCOPY WITH HOLMIUM LASER LITHOTRIPSY (Left) as a surgical intervention .  The patient's history has been reviewed, patient examined, no change in status, stable for surgery.  I have reviewed the patient's chart and labs.  Questions were answered to the patient's satisfaction.   CV RRR lungs clear  Pain is better however she is on a PCA.  She passed to minute particles.  Complaining of significant frequency, urgency.  Stone most likely at the UVJ.  She desires to proceed with surgery.  [name] C Stevey Stapleton

## 2017-04-12 NOTE — Final Progress Note (Signed)
Discharge Day SOAP Note:  Progress Note   Fabio Beringshley M [name] is a 27 y.o. G2P0101 status post cystoscopy. Pt tolerated procedure well and has no complications. She is tolerating PO , urinating without difficulty and has minimal pain.  Subjective  The patient has the following complaints: has no unusual complaints  Pain is controlled with current medications.   Patient is urinating without difficulty.  She is ambulating well.    Objective  Vital signs: BP 130/72 (BP Location: Right Arm)   Pulse 79   Temp 98 F (36.7 C) (Oral)   Resp 18   Ht 5\' 7"  (1.702 m)   Wt 225 lb (102.1 kg)   LMP 08/04/2016   SpO2 99%   BMI 35.24 kg/m   Physical Exam: Gen: NAD Fundus    Lochia    Perineum       Data Review Labs: CBC Latest Ref Rng & Units 04/11/2017 04/09/2017 04/03/2017  WBC 3.6 - 11.0 K/uL 9.1 11.9(H) 9.8  Hemoglobin 12.0 - 16.0 g/dL 9.6(E8.7(L) 4.5(W9.8(L) 0.9(W9.3(L)  Hematocrit 35.0 - 47.0 % 26.0(L) 30.0(L) 27.9(L)  Platelets 150 - 440 K/uL 168 179 200   O NEG  Assessment/Plan  Active Problems:   Labor and delivery, indication for care   Calculus of kidney affecting pregnancy    Plan for discharge today.   Discharge Instructions: Per After Visit Summary. Activity: Advance as tolerated.  Diet: Regular Medications: Allergies as of 04/12/2017   No Known Allergies     Medication List    STOP taking these medications   HYDROmorphone 2 MG tablet Commonly known as:  DILAUDID   oxyCODONE-acetaminophen 5-325 MG tablet Commonly known as:  PERCOCET/ROXICET   tamsulosin 0.4 MG Caps capsule Commonly known as:  FLOMAX     TAKE these medications   acetaminophen 500 MG tablet Commonly known as:  TYLENOL Take 2 tablets (1,000 mg total) by mouth every 6 (six) hours as needed for moderate pain.   aspirin EC 81 MG tablet Take 1 tablet (81 mg total) by mouth daily. Take after 12 weeks for prevention of preeclampssia later in pregnancy   cyanocobalamin 1000 MCG/ML injection Commonly  known as:  (VITAMIN B-12) Inject 1 mL (1,000 mcg total) into the muscle once a week.   FUSION PLUS Caps Take 1 tablet by mouth daily.   ondansetron 4 MG tablet Commonly known as:  ZOFRAN Take 1 tablet (4 mg total) by mouth every 6 (six) hours as needed for nausea or vomiting (try second).   prenatal multivitamin Tabs tablet Take 1 tablet by mouth daily at 12 noon.   promethazine 25 MG tablet Commonly known as:  PHENERGAN Take 1 tablet (25 mg total) by mouth every 6 (six) hours as needed for nausea or vomiting.   SYRINGE-NEEDLE (DISP) 3 ML 25G X 1-1/2" 3 ML Misc Commonly known as:  B-D 3CC LUER-LOK SYR 25GX1/2" 1 mL by Does not apply route every other day.      Outpatient follow up: As scheduled for return OB on Monday    Discharged Condition: good  Discharged to: home    Doreene Burkennie [place] Cloe, [place]  04/12/2017 4:35 PM

## 2017-04-12 NOTE — Discharge Summary (Signed)
Discharge Summary  Date of Admission: 04/11/2017  Date of Discharge: 04/12/2017  Admitting Diagnosis: calculus of kidney affecting pregnancy  at [redacted]w[redacted]d     Discharge Diagnosis: staus post cystoscopy   Intrapartum Procedures: cystoscopy ( see op note for full details)   Complications: none                      Discharge Day SOAP Note:  Progress Note   Jade Palmer is a 27 y.o. G2P0101 status post cystoscopy. Pt tolerated procedure well and has no complications. She is tolerating PO , urinating without difficulty and has minimal pain.  Subjective  The patient has the following complaints: has no unusual complaints  Pain is controlled with current medications.   Patient is urinating without difficulty.  She is ambulating well.    Objective  Vital signs: BP 130/72 (BP Location: Right Arm)   Pulse 79   Temp 98 F (36.7 C) (Oral)   Resp 18   Ht 5\' 7"  (1.702 m)   Wt 225 lb (102.1 kg)   LMP 08/04/2016   SpO2 99%   BMI 35.24 kg/m   Physical Exam: Gen: NAD Fundus    Lochia    Perineum       Data Review Labs: CBC Latest Ref Rng & Units 04/11/2017 04/09/2017 04/03/2017  WBC 3.6 - 11.0 K/uL 9.1 11.9(H) 9.8  Hemoglobin 12.0 - 16.0 g/dL 1.1(B) 1.4(N) 8.2(N)  Hematocrit 35.0 - 47.0 % 26.0(L) 30.0(L) 27.9(L)  Platelets 150 - 440 K/uL 168 179 200   O NEG  Assessment/Plan  Active Problems:   Labor and delivery, indication for care   Calculus of kidney affecting pregnancy    Plan for discharge today.   Discharge Instructions: Per After Visit Summary. Activity: Advance as tolerated.  Diet: Regular Medications: Allergies as of 04/12/2017   No Known Allergies     Medication List    STOP taking these medications   HYDROmorphone 2 MG tablet Commonly known as:  DILAUDID   oxyCODONE-acetaminophen 5-325 MG tablet Commonly known as:  PERCOCET/ROXICET   tamsulosin 0.4 MG Caps capsule Commonly known as:  FLOMAX     TAKE these medications    acetaminophen 500 MG tablet Commonly known as:  TYLENOL Take 2 tablets (1,000 mg total) by mouth every 6 (six) hours as needed for moderate pain.   aspirin EC 81 MG tablet Take 1 tablet (81 mg total) by mouth daily. Take after 12 weeks for prevention of preeclampssia later in pregnancy   cyanocobalamin 1000 MCG/ML injection Commonly known as:  (VITAMIN B-12) Inject 1 mL (1,000 mcg total) into the muscle once a week.   FUSION PLUS Caps Take 1 tablet by mouth daily.   ondansetron 4 MG tablet Commonly known as:  ZOFRAN Take 1 tablet (4 mg total) by mouth every 6 (six) hours as needed for nausea or vomiting (try second).   prenatal multivitamin Tabs tablet Take 1 tablet by mouth daily at 12 noon.   promethazine 25 MG tablet Commonly known as:  PHENERGAN Take 1 tablet (25 mg total) by mouth every 6 (six) hours as needed for nausea or vomiting.   SYRINGE-NEEDLE (DISP) 3 ML 25G X 1-1/2" 3 ML Misc Commonly known as:  B-D 3CC LUER-LOK SYR 25GX1/2" 1 mL by Does not apply route every other day.      Outpatient follow up: As scheduled for return OB on Monday    Discharged Condition: good  Discharged to: home  Jade Palmer, CNM  04/12/2017 4:35 PM

## 2017-04-12 NOTE — Progress Notes (Signed)
Patient discharged home. Discharge instructions and prescriptions given and reviewed with patient. Patient verbalized understanding. Escorted out by auxillary.  

## 2017-04-12 NOTE — Anesthesia Preprocedure Evaluation (Signed)
Anesthesia Evaluation  Patient identified by MRN, date of birth, ID band Patient awake    Reviewed: Allergy & Precautions, H&P , NPO status , Patient's Chart, lab work & pertinent test results, reviewed documented beta blocker date and time   History of Anesthesia Complications (+) POST - OP SPINAL HEADACHE and history of anesthetic complications  Airway Mallampati: III  TM Distance: >3 FB Neck ROM: full    Dental  (+) Teeth Intact, Dental Advidsory Given   Pulmonary neg pulmonary ROS,           Cardiovascular Exercise Tolerance: Good negative cardio ROS       Neuro/Psych  Headaches, Seizures -, Well Controlled,  negative psych ROS   GI/Hepatic Neg liver ROS, GERD  ,  Endo/Other  negative endocrine ROS  Renal/GU Renal disease (kidney stones)  negative genitourinary   Musculoskeletal   Abdominal   Peds  Hematology  (+) Blood dyscrasia, anemia ,   Anesthesia Other Findings Past Medical History: 04/11/2017: Calculus of kidney affecting pregnancy 04/12/2013: Contraceptive education No date: History of kidney stones No date: Seizures (HCC)     Comment:  had seizure at age 27. Unknown etiology- no meds for               this in about 5 years  Patient states that she passed something in her urine overnight.  Will discuss with Dr. Lonna CobbStoioff prior to proceeding with surgery.  Reproductive/Obstetrics negative OB ROS                             Anesthesia Physical Anesthesia Plan  ASA: II  Anesthesia Plan: General   Post-op Pain Management:    Induction: Intravenous  PONV Risk Score and Plan: 3 and Ondansetron  Airway Management Planned: LMA  Additional Equipment:   Intra-op Plan:   Post-operative Plan: Extubation in OR  Informed Consent: I have reviewed the patients History and Physical, chart, labs and discussed the procedure including the risks, benefits and alternatives for the  proposed anesthesia with the patient or authorized representative who has indicated his/her understanding and acceptance.   Dental Advisory Given  Plan Discussed with: Anesthesiologist, CRNA and Surgeon  Anesthesia Plan Comments:         Anesthesia Quick Evaluation

## 2017-04-12 NOTE — Transfer of Care (Signed)
Immediate Anesthesia Transfer of Care Note  Patient: Jade Palmer  Procedure(s) Performed: CYSTOSCOPY (Left ) URETEROSCOPY (Left )  Patient Location: PACU  Anesthesia Type:General  Level of Consciousness: awake, alert  and oriented  Airway & Oxygen Therapy: Patient Spontanous Breathing and Patient connected to face mask oxygen  Post-op Assessment: Report given to RN and Post -op Vital signs reviewed and stable  Post vital signs: Reviewed and stable  Last Vitals:  Vitals:   04/12/17 0351 04/12/17 0351  BP:  132/76  Pulse:  77  Resp: 14 18  Temp:  36.7 C  SpO2: 98% 100%    Last Pain:  Vitals:   04/12/17 0646  TempSrc:   PainSc: 0-No pain      Patients Stated Pain Goal: 0 (04/11/17 2020)  Complications: No apparent anesthesia complications

## 2017-04-12 NOTE — OR Nursing (Signed)
Fetal heart rate monitored throughout procedure. Range between 110-113 bpm per Leim Fabryatherine Gaither L&D RN.

## 2017-04-12 NOTE — Progress Notes (Signed)
OB/GYN PROGRESS NOTE: Patient is 35.[redacted] weeks gestation now with pregnancy complicated by ureterolithiasis.  She has effectively managed her pain at home with oral pain medication and has been admitted for IV analgesia and hydration with an attempt to pass the renal stone. Urology has agreed to proceed with ureteroscopy today to try and extract the stone.  Prior to undergoing anesthesia for the urology procedure, the patient will have preoperative antepartum fetal monitoring as well as postoperative antepartum fetal monitoring.  She will be monitored during the case for reassurance of fetal well-being.  Preop counseling for potential cesarean section: I met with the patient yesterday afternoon to discuss the potential complications of urologic surgery where fetal distress may be encountered with a possible need for cesarean section delivery.  The patient is understanding of this potential complication and is willing to allow [place] to proceed with cesarean section in order to deliver a healthy baby safely if necessary.  She is accepting of all risks which include but are not limited to bleeding, infection, pelvic organ injury with need for repair, fetal injury, blood clot disorders, etc.  All of her questions were addressed.  She is willing to proceed with the surgery as scheduled.  Brayton Mars, MD  Note: This dictation was prepared with Dragon dictation along with smaller phrase technology. Any transcriptional errors that result from this process are unintentional.

## 2017-04-12 NOTE — Op Note (Signed)
Preoperative diagnosis: Left ureteral calculus  Postoperative diagnosis: Left ureteral calculus  Procedure:  1. Cystoscopy 2. Left ureteroscopy and stone removal  Surgeon: Lorin PicketScott C. Stoioff, M.D.  Anesthesia: General  Complications: None  Intraoperative findings:  3 mm left distal ureteral calculus   EBL: Minimal  Specimens: 1. Calculus for analysis   Indication: Jade Palmer is a 27 y.o. year old patient with IUP at 36 weeks.  She had onset of left flank pain over the weekend and failed outpatient management.  Initial ultrasound had shown mild left hydronephrosis and repeat ultrasound on readmission showed a echogenic focus at the bladder base felt to most likely represent a distal ureteral calculus.  Due to continued pain and need for IV narcotic analgesics she has elected to proceed with left ureteroscopy and possible stent placement.  After reviewing the management options for treatment, the patient elected to proceed with the above surgical procedure(s). We have discussed the potential benefits and risks of the procedure, side effects of the proposed treatment, the likelihood of the patient achieving the goals of the procedure, and any potential problems that might occur during the procedure or recuperation. Informed consent has been obtained.  Description of procedure:  The patient was taken to the operating room and general anesthesia was induced.  The patient was placed in the dorsal lithotomy position, prepped and draped in the usual sterile fashion, and preoperative antibiotics were administered. A preoperative time-out was performed.   A 22 French cystoscope was lubricated and passed under direct vision.  The urethra was normal in appearance.  Panendoscopy was performed and the bladder mucosa showed no erythema, solid or papillary lesions.  Attention was directed to the left ureteral orifice and a 0.038 Sensor wire was then advanced up the left ureter without difficulty.   No fluoroscopy was used.  A 4.5 Fr semirigid ureteroscope was then advanced into the ureter next to the guidewire and the calculus was identified approximately 1 cm proximal to the UVJ.  The calculus was small and amenable to basketing.  A 1.9 French nitinol basket was placed through the ureteroscope and the stone was easily captured and removed without difficulty.  And advanced up to the pelvic brim and no additional calculi were seen.  Due to the ease of stone removal a ureteral stent was not placed.  The guidewire and all instruments were removed.    No x-ray or fluoroscopy was used during the procedure.  Continuous fetal monitoring was performed pre-induction and throughout the procedure.  The bladder was then emptied and the procedure ended.  The patient appeared to tolerate the procedure well and without complications.  After anesthetic reversal the patient was transported to the PACU in stable condition.    Riki AltesScott C Stoioff, MD

## 2017-04-12 NOTE — Anesthesia Post-op Follow-up Note (Signed)
Anesthesia QCDR form completed.        

## 2017-04-13 ENCOUNTER — Encounter: Payer: Self-pay | Admitting: Urology

## 2017-04-13 LAB — BPAM RBC
Blood Product Expiration Date: 201903222359
Blood Product Expiration Date: 201903282359
Unit Type and Rh: 9500
Unit Type and Rh: 9500

## 2017-04-13 LAB — TYPE AND SCREEN
ABO/RH(D): O NEG
ANTIBODY SCREEN: POSITIVE
Unit division: 0
Unit division: 0

## 2017-04-13 NOTE — Anesthesia Postprocedure Evaluation (Signed)
Anesthesia Post Note  Patient: Jade Palmer  Procedure(s) Performed: CYSTOSCOPY (Left ) URETEROSCOPY (Left )  Patient location during evaluation: PACU Anesthesia Type: General Level of consciousness: awake and alert Pain management: pain level controlled Vital Signs Assessment: post-procedure vital signs reviewed and stable Respiratory status: spontaneous breathing, nonlabored ventilation, respiratory function stable and patient connected to nasal cannula oxygen Cardiovascular status: blood pressure returned to baseline and stable Postop Assessment: no apparent nausea or vomiting Anesthetic complications: no     Last Vitals:  Vitals:   04/12/17 1152 04/12/17 1515  BP: 134/76 130/72  Pulse: 97 79  Resp: 18 18  Temp: 36.7 C 36.7 C  SpO2: 98% 99%    Last Pain:  Vitals:   04/12/17 1515  TempSrc: Oral  PainSc:                  Lenard SimmerAndrew Eathan Groman

## 2017-04-17 ENCOUNTER — Ambulatory Visit (INDEPENDENT_AMBULATORY_CARE_PROVIDER_SITE_OTHER): Payer: No Typology Code available for payment source | Admitting: Certified Nurse Midwife

## 2017-04-17 VITALS — BP 102/72 | HR 72 | Wt 228.1 lb

## 2017-04-17 DIAGNOSIS — Z3483 Encounter for supervision of other normal pregnancy, third trimester: Secondary | ICD-10-CM | POA: Diagnosis not present

## 2017-04-17 LAB — POCT URINALYSIS DIPSTICK
BILIRUBIN UA: NEGATIVE
Blood, UA: NEGATIVE
GLUCOSE UA: NEGATIVE
Ketones, UA: NEGATIVE
Leukocytes, UA: NEGATIVE
Nitrite, UA: NEGATIVE
Protein, UA: NEGATIVE
SPEC GRAV UA: 1.01 (ref 1.010–1.025)
UROBILINOGEN UA: 0.2 U/dL
pH, UA: 7.5 (ref 5.0–8.0)

## 2017-04-17 NOTE — Patient Instructions (Signed)
Vaginal Delivery Vaginal delivery means that you will give birth by pushing your baby out of your birth canal (vagina). A team of health care providers will help you before, during, and after vaginal delivery. Birth experiences are unique for every woman and every pregnancy, and birth experiences vary depending on where you choose to give birth. What should I do to prepare for my baby's birth? Before your baby is born, it is important to talk with your health care provider about:  Your labor and delivery preferences. These may include: ? Medicines that you may be given. ? How you will manage your pain. This might include non-medical pain relief techniques or injectable pain relief such as epidural analgesia. ? How you and your baby will be monitored during labor and delivery. ? Who may be in the labor and delivery room with you. ? Your feelings about surgical delivery of your baby (cesarean delivery, or C-section) if this becomes necessary. ? Your feelings about receiving donated blood through an IV tube (blood transfusion) if this becomes necessary.  Whether you are able: ? To take pictures or videos of the birth. ? To eat during labor and delivery. ? To move around, walk, or change positions during labor and delivery.  What to expect after your baby is born, such as: ? Whether delayed umbilical cord clamping and cutting is offered. ? Who will care for your baby right after birth. ? Medicines or tests that may be recommended for your baby. ? Whether breastfeeding is supported in your hospital or birth center. ? How long you will be in the hospital or birth center.  How any medical conditions you have may affect your baby or your labor and delivery experience.  To prepare for your baby's birth, you should also:  Attend all of your health care visits before delivery (prenatal visits) as recommended by your health care provider. This is important.  Prepare your home for your baby's  arrival. Make sure that you have: ? Diapers. ? Baby clothing. ? Feeding equipment. ? Safe sleeping arrangements for you and your baby.  Install a car seat in your vehicle. Have your car seat checked by a certified car seat installer to make sure that it is installed safely.  Think about who will help you with your new baby at home for at least the first several weeks after delivery.  What can I expect when I arrive at the birth center or hospital? Once you are in labor and have been admitted into the hospital or birth center, your health care provider may:  Review your pregnancy history and any concerns you have.  Insert an IV tube into one of your veins. This is used to give you fluids and medicines.  Check your blood pressure, pulse, temperature, and heart rate (vital signs).  Check whether your bag of water (amniotic sac) has broken (ruptured).  Talk with you about your birth plan and discuss pain control options.  Monitoring Your health care provider may monitor your contractions (uterine monitoring) and your baby's heart rate (fetal monitoring). You may need to be monitored:  Often, but not continuously (intermittently).  All the time or for long periods at a time (continuously). Continuous monitoring may be needed if: ? You are taking certain medicines, such as medicine to relieve pain or make your contractions stronger. ? You have pregnancy or labor complications.  Monitoring may be done by:  Placing a special stethoscope or a handheld monitoring device on your abdomen to   check your baby's heartbeat, and feeling your abdomen for contractions. This method of monitoring does not continuously record your baby's heartbeat or your contractions.  Placing monitors on your abdomen (external monitors) to record your baby's heartbeat and the frequency and length of contractions. You may not have to wear external monitors all the time.  Placing monitors inside of your uterus  (internal monitors) to record your baby's heartbeat and the frequency, length, and strength of your contractions. ? Your health care provider may use internal monitors if he or she needs more information about the strength of your contractions or your baby's heart rate. ? Internal monitors are put in place by passing a thin, flexible wire through your vagina and into your uterus. Depending on the type of monitor, it may remain in your uterus or on your baby's head until birth. ? Your health care provider will discuss the benefits and risks of internal monitoring with you and will ask for your permission before inserting the monitors.  Telemetry. This is a type of continuous monitoring that can be done with external or internal monitors. Instead of having to stay in bed, you are able to move around during telemetry. Ask your health care provider if telemetry is an option for you.  Physical exam Your health care provider may perform a physical exam. This may include:  Checking whether your baby is positioned: ? With the head toward your vagina (head-down). This is most common. ? With the head toward the top of your uterus (head-up or breech). If your baby is in a breech position, your health care provider may try to turn your baby to a head-down position so you can deliver vaginally. If it does not seem that your baby can be born vaginally, your provider may recommend surgery to deliver your baby. In rare cases, you may be able to deliver vaginally if your baby is head-up (breech delivery). ? Lying sideways (transverse). Babies that are lying sideways cannot be delivered vaginally.  Checking your cervix to determine: ? Whether it is thinning out (effacing). ? Whether it is opening up (dilating). ? How low your baby has moved into your birth canal.  What are the three stages of labor and delivery?  Normal labor and delivery is divided into the following three stages: Stage 1  Stage 1 is the  longest stage of labor, and it can last for hours or days. Stage 1 includes: ? Early labor. This is when contractions may be irregular, or regular and mild. Generally, early labor contractions are more than 10 minutes apart. ? Active labor. This is when contractions get longer, more regular, more frequent, and more intense. ? The transition phase. This is when contractions happen very close together, are very intense, and may last longer than during any other part of labor.  Contractions generally feel mild, infrequent, and irregular at first. They get stronger, more frequent (about every 2-3 minutes), and more regular as you progress from early labor through active labor and transition.  Many women progress through stage 1 naturally, but you may need help to continue making progress. If this happens, your health care provider may talk with you about: ? Rupturing your amniotic sac if it has not ruptured yet. ? Giving you medicine to help make your contractions stronger and more frequent.  Stage 1 ends when your cervix is completely dilated to 4 inches (10 cm) and completely effaced. This happens at the end of the transition phase. Stage 2  Once   your cervix is completely effaced and dilated to 4 inches (10 cm), you may start to feel an urge to push. It is common for the body to naturally take a rest before feeling the urge to push, especially if you received an epidural or certain other pain medicines. This rest period may last for up to 1-2 hours, depending on your unique labor experience.  During stage 2, contractions are generally less painful, because pushing helps relieve contraction pain. Instead of contraction pain, you may feel stretching and burning pain, especially when the widest part of your baby's head passes through the vaginal opening (crowning).  Your health care provider will closely monitor your pushing progress and your baby's progress through the vagina during stage 2.  Your  health care provider may massage the area of skin between your vaginal opening and anus (perineum) or apply warm compresses to your perineum. This helps it stretch as the baby's head starts to crown, which can help prevent perineal tearing. ? In some cases, an incision may be made in your perineum (episiotomy) to allow the baby to pass through the vaginal opening. An episiotomy helps to make the opening of the vagina larger to allow more room for the baby to fit through.  It is very important to breathe and focus so your health care provider can control the delivery of your baby's head. Your health care provider may have you decrease the intensity of your pushing, to help prevent perineal tearing.  After delivery of your baby's head, the shoulders and the rest of the body generally deliver very quickly and without difficulty.  Once your baby is delivered, the umbilical cord may be cut right away, or this may be delayed for 1-2 minutes, depending on your baby's health. This may vary among health care providers, hospitals, and birth centers.  If you and your baby are healthy enough, your baby may be placed on your chest or abdomen to help maintain the baby's temperature and to help you bond with each other. Some mothers and babies start breastfeeding at this time. Your health care team will dry your baby and help keep your baby warm during this time.  Your baby may need immediate care if he or she: ? Showed signs of distress during labor. ? Has a medical condition. ? Was born too early (prematurely). ? Had a bowel movement before birth (meconium). ? Shows signs of difficulty transitioning from being inside the uterus to being outside of the uterus. If you are planning to breastfeed, your health care team will help you begin a feeding. Stage 3  The third stage of labor starts immediately after the birth of your baby and ends after you deliver the placenta. The placenta is an organ that develops  during pregnancy to provide oxygen and nutrients to your baby in the womb.  Delivering the placenta may require some pushing, and you may have mild contractions. Breastfeeding can stimulate contractions to help you deliver the placenta.  After the placenta is delivered, your uterus should tighten (contract) and become firm. This helps to stop bleeding in your uterus. To help your uterus contract and to control bleeding, your health care provider may: ? Give you medicine by injection, through an IV tube, by mouth, or through your rectum (rectally). ? Massage your abdomen or perform a vaginal exam to remove any blood clots that are left in your uterus. ? Empty your bladder by placing a thin, flexible tube (catheter) into your bladder. ? Encourage   you to breastfeed your baby. After labor is over, you and your baby will be monitored closely to ensure that you are both healthy until you are ready to go home. Your health care team will teach you how to care for yourself and your baby. This information is not intended to replace advice given to you by your health care provider. Make sure you discuss any questions you have with your health care provider. Document Released: 11/03/2007 Document Revised: 08/14/2015 Document Reviewed: 02/08/2015 Elsevier Interactive Patient Education  2018 [employer]  

## 2017-04-17 NOTE — Progress Notes (Signed)
ROB-Doing well after kidney stone surgery, would like to return to work with limit hours. Note given. Discussed home treatment measures for discomforts of pregnancy. Reviewed red flag symptoms and when to call. RTC x 1 week for ROB or sooner if needed.

## 2017-04-17 NOTE — Progress Notes (Signed)
Pt is here for an ROB visit. Has surgery to remove kidney stone on 04/12/17.Also c/o headache.

## 2017-04-25 ENCOUNTER — Encounter: Payer: Self-pay | Admitting: Obstetrics and Gynecology

## 2017-04-25 ENCOUNTER — Ambulatory Visit (INDEPENDENT_AMBULATORY_CARE_PROVIDER_SITE_OTHER): Payer: No Typology Code available for payment source | Admitting: Obstetrics and Gynecology

## 2017-04-25 VITALS — BP 126/84 | HR 94 | Wt 233.1 lb

## 2017-04-25 DIAGNOSIS — Z3493 Encounter for supervision of normal pregnancy, unspecified, third trimester: Secondary | ICD-10-CM

## 2017-04-25 DIAGNOSIS — Z113 Encounter for screening for infections with a predominantly sexual mode of transmission: Secondary | ICD-10-CM

## 2017-04-25 LAB — POCT URINALYSIS DIPSTICK
BILIRUBIN UA: NEGATIVE
Glucose, UA: NEGATIVE
KETONES UA: NEGATIVE
Leukocytes, UA: NEGATIVE
NITRITE UA: NEGATIVE
PH UA: 6.5 (ref 5.0–8.0)
PROTEIN UA: NEGATIVE
RBC UA: NEGATIVE
Spec Grav, UA: 1.01 (ref 1.010–1.025)
UROBILINOGEN UA: 0.2 U/dL

## 2017-04-25 NOTE — Progress Notes (Signed)
ROB- pt is having a lot of pelvic pressure 

## 2017-04-25 NOTE — Progress Notes (Signed)
ROB-doing well, discussed back pain in pregnancy; discussed lavender oil for itching on stretched. Discussed IOL and can't plan until closer to due as there is no medical indication at this time

## 2017-04-27 LAB — GC/CHLAMYDIA PROBE AMP
CHLAMYDIA, DNA PROBE: NEGATIVE
Neisseria gonorrhoeae by PCR: NEGATIVE

## 2017-05-02 ENCOUNTER — Encounter: Payer: Self-pay | Admitting: Certified Nurse Midwife

## 2017-05-02 ENCOUNTER — Ambulatory Visit (INDEPENDENT_AMBULATORY_CARE_PROVIDER_SITE_OTHER): Payer: No Typology Code available for payment source | Admitting: Certified Nurse Midwife

## 2017-05-02 VITALS — BP 110/82 | HR 94 | Wt 235.5 lb

## 2017-05-02 DIAGNOSIS — Z3493 Encounter for supervision of normal pregnancy, unspecified, third trimester: Secondary | ICD-10-CM

## 2017-05-02 LAB — CBC WITH DIFFERENTIAL/PLATELET
BASOS ABS: 0 10*3/uL (ref 0.0–0.2)
Basos: 0 %
EOS (ABSOLUTE): 0.1 10*3/uL (ref 0.0–0.4)
Eos: 1 %
Hematocrit: 29.8 % — ABNORMAL LOW (ref 34.0–46.6)
Hemoglobin: 10.1 g/dL — ABNORMAL LOW (ref 11.1–15.9)
IMMATURE GRANS (ABS): 0.1 10*3/uL (ref 0.0–0.1)
Immature Granulocytes: 1 %
LYMPHS ABS: 1.6 10*3/uL (ref 0.7–3.1)
LYMPHS: 17 %
MCH: 28.9 pg (ref 26.6–33.0)
MCHC: 33.9 g/dL (ref 31.5–35.7)
MCV: 85 fL (ref 79–97)
Monocytes Absolute: 0.8 10*3/uL (ref 0.1–0.9)
Monocytes: 8 %
Neutrophils Absolute: 6.8 10*3/uL (ref 1.4–7.0)
Neutrophils: 73 %
PLATELETS: 184 10*3/uL (ref 150–379)
RBC: 3.5 x10E6/uL — AB (ref 3.77–5.28)
RDW: 15.8 % — ABNORMAL HIGH (ref 12.3–15.4)
WBC: 9.3 10*3/uL (ref 3.4–10.8)

## 2017-05-02 LAB — POCT URINALYSIS DIPSTICK
Bilirubin, UA: NEGATIVE
Blood, UA: NEGATIVE
Glucose, UA: NEGATIVE
KETONES UA: NEGATIVE
LEUKOCYTES UA: NEGATIVE
NITRITE UA: NEGATIVE
PH UA: 6 (ref 5.0–8.0)
Spec Grav, UA: 1.02 (ref 1.010–1.025)
UROBILINOGEN UA: 0.2 U/dL

## 2017-05-02 LAB — COMPREHENSIVE METABOLIC PANEL
ALK PHOS: 117 IU/L (ref 39–117)
ALT: 8 IU/L (ref 0–32)
AST: 12 IU/L (ref 0–40)
Albumin/Globulin Ratio: 1.3 (ref 1.2–2.2)
Albumin: 3.5 g/dL (ref 3.5–5.5)
BUN/Creatinine Ratio: 16 (ref 9–23)
BUN: 9 mg/dL (ref 6–20)
CHLORIDE: 103 mmol/L (ref 96–106)
CO2: 19 mmol/L — AB (ref 20–29)
Calcium: 9.4 mg/dL (ref 8.7–10.2)
Creatinine, Ser: 0.56 mg/dL — ABNORMAL LOW (ref 0.57–1.00)
GFR calc Af Amer: 148 mL/min/{1.73_m2} (ref >59)
GFR calc non Af Amer: 128 mL/min/{1.73_m2} (ref >59)
GLUCOSE: 75 mg/dL (ref 65–99)
Globulin, Total: 2.6 g/dL (ref 1.5–4.5)
Potassium: 4.4 mmol/L (ref 3.5–5.2)
Sodium: 137 mmol/L (ref 134–144)
Total Protein: 6.1 g/dL (ref 6.0–8.5)

## 2017-05-02 LAB — PROTEIN / CREATININE RATIO, URINE
CREATININE, UR: 190 mg/dL
PROTEIN UR: 38.1 mg/dL
PROTEIN/CREAT RATIO: 201 mg/g{creat} — AB (ref 0–200)

## 2017-05-02 NOTE — Progress Notes (Signed)
ROB, doing well. Pt complains of headaches over the last few days. PT complains of swelling with standing for long time. Pt has history of PIH, Labs today. Discussed induction at 39 wks if favorable. SVE 3/70/-2. Reflexes today +2, negative clonus, negative epigastric pain.

## 2017-05-02 NOTE — Progress Notes (Signed)
Pt is here for an ROB visit. 

## 2017-05-02 NOTE — Patient Instructions (Signed)
Braxton Hicks Contractions °Contractions of the uterus can occur throughout pregnancy, but they are not always a sign that you are in labor. You may have practice contractions called Braxton Hicks contractions. These false labor contractions are sometimes confused with true labor. °What are Braxton Hicks contractions? °Braxton Hicks contractions are tightening movements that occur in the muscles of the uterus before labor. Unlike true labor contractions, these contractions do not result in opening (dilation) and thinning of the cervix. Toward the end of pregnancy (32-34 weeks), Braxton Hicks contractions can happen more often and may become stronger. These contractions are sometimes difficult to tell apart from true labor because they can be very uncomfortable. You should not feel embarrassed if you go to the hospital with false labor. °Sometimes, the only way to tell if you are in true labor is for your health care provider to look for changes in the cervix. The health care provider will do a physical exam and may monitor your contractions. If you are not in true labor, the exam should show that your cervix is not dilating and your water has not broken. °If there are other health problems associated with your pregnancy, it is completely safe for you to be sent home with false labor. You may continue to have Braxton Hicks contractions until you go into true labor. °How to tell the difference between true labor and false labor °True labor °· Contractions last 30-70 seconds. °· Contractions become very regular. °· Discomfort is usually felt in the top of the uterus, and it spreads to the lower abdomen and low back. °· Contractions do not go away with walking. °· Contractions usually become more intense and increase in frequency. °· The cervix dilates and gets thinner. °False labor °· Contractions are usually shorter and not as strong as true labor contractions. °· Contractions are usually irregular. °· Contractions  are often felt in the front of the lower abdomen and in the groin. °· Contractions may go away when you walk around or change positions while lying down. °· Contractions get weaker and are shorter-lasting as time goes on. °· The cervix usually does not dilate or become thin. °Follow these instructions at home: °· Take over-the-counter and prescription medicines only as told by your health care provider. °· Keep up with your usual exercises and follow other instructions from your health care provider. °· Eat and drink lightly if you think you are going into labor. °· If Braxton Hicks contractions are making you uncomfortable: °? Change your position from lying down or resting to walking, or change from walking to resting. °? Sit and rest in a tub of warm water. °? Drink enough fluid to keep your urine pale yellow. Dehydration may cause these contractions. °? Do slow and deep breathing several times an hour. °· Keep all follow-up prenatal visits as told by your health care provider. This is important. °Contact a health care provider if: °· You have a fever. °· You have continuous pain in your abdomen. °Get help right away if: °· Your contractions become stronger, more regular, and closer together. °· You have fluid leaking or gushing from your vagina. °· You pass blood-tinged mucus (bloody show). °· You have bleeding from your vagina. °· You have low back pain that you never had before. °· You feel your baby’s head pushing down and causing pelvic pressure. °· Your baby is not moving inside you as much as it used to. °Summary °· Contractions that occur before labor are called Braxton   Hicks contractions, false labor, or practice contractions. °· Braxton Hicks contractions are usually shorter, weaker, farther apart, and less regular than true labor contractions. True labor contractions usually become progressively stronger and regular and they become more frequent. °· Manage discomfort from Braxton Hicks contractions by  changing position, resting in a warm bath, drinking plenty of water, or practicing deep breathing. °This information is not intended to replace advice given to you by your health care provider. Make sure you discuss any questions you have with your health care provider. °Document Released: 06/09/2016 Document Revised: 06/09/2016 Document Reviewed: 06/09/2016 °Elsevier Interactive Patient Education © 2018 [employer] ° °

## 2017-05-04 ENCOUNTER — Other Ambulatory Visit: Payer: Self-pay | Admitting: Certified Nurse Midwife

## 2017-05-05 ENCOUNTER — Inpatient Hospital Stay: Payer: No Typology Code available for payment source | Admitting: Certified Registered Nurse Anesthetist

## 2017-05-05 ENCOUNTER — Inpatient Hospital Stay
Admission: EM | Admit: 2017-05-05 | Discharge: 2017-05-07 | DRG: 807 | Disposition: A | Discharge status: Home / Self Care | Payer: No Typology Code available for payment source | Attending: Certified Nurse Midwife | Admitting: Certified Nurse Midwife

## 2017-05-05 ENCOUNTER — Other Ambulatory Visit: Payer: Self-pay

## 2017-05-05 DIAGNOSIS — O09219 Supervision of pregnancy with history of pre-term labor, unspecified trimester: Secondary | ICD-10-CM

## 2017-05-05 DIAGNOSIS — D649 Anemia, unspecified: Secondary | ICD-10-CM | POA: Diagnosis present

## 2017-05-05 DIAGNOSIS — O26893 Other specified pregnancy related conditions, third trimester: Secondary | ICD-10-CM | POA: Diagnosis present

## 2017-05-05 DIAGNOSIS — Z3483 Encounter for supervision of other normal pregnancy, third trimester: Secondary | ICD-10-CM | POA: Diagnosis present

## 2017-05-05 DIAGNOSIS — Z7982 Long term (current) use of aspirin: Secondary | ICD-10-CM

## 2017-05-05 DIAGNOSIS — O9902 Anemia complicating childbirth: Secondary | ICD-10-CM | POA: Diagnosis present

## 2017-05-05 DIAGNOSIS — O99019 Anemia complicating pregnancy, unspecified trimester: Secondary | ICD-10-CM

## 2017-05-05 DIAGNOSIS — Z6791 Unspecified blood type, Rh negative: Secondary | ICD-10-CM

## 2017-05-05 DIAGNOSIS — N200 Calculus of kidney: Secondary | ICD-10-CM | POA: Diagnosis present

## 2017-05-05 DIAGNOSIS — Z3A39 39 weeks gestation of pregnancy: Secondary | ICD-10-CM

## 2017-05-05 DIAGNOSIS — O09899 Supervision of other high risk pregnancies, unspecified trimester: Secondary | ICD-10-CM

## 2017-05-05 LAB — CBC
HEMATOCRIT: 28.5 % — AB (ref 35.0–47.0)
HEMOGLOBIN: 9.4 g/dL — AB (ref 12.0–16.0)
MCH: 28.6 pg (ref 26.0–34.0)
MCHC: 32.9 g/dL (ref 32.0–36.0)
MCV: 87 fL (ref 80.0–100.0)
Platelets: 169 10*3/uL (ref 150–440)
RBC: 3.27 MIL/uL — ABNORMAL LOW (ref 3.80–5.20)
RDW: 15.8 % — ABNORMAL HIGH (ref 11.5–14.5)
WBC: 10.6 10*3/uL (ref 3.6–11.0)

## 2017-05-05 MED ORDER — ONDANSETRON HCL 4 MG/2ML IJ SOLN: 4.0000 mg | INTRAMUSCULAR | Status: DC | PRN

## 2017-05-05 MED ORDER — LIDOCAINE HCL (PF) 1 % IJ SOLN: 30.0000 mL | INTRAMUSCULAR | Status: DC | PRN

## 2017-05-05 MED ORDER — BUTORPHANOL TARTRATE 2 MG/ML IJ SOLN
1.0000 mg | INTRAMUSCULAR | Status: DC | PRN
  Administered 2017-05-05: 1 mg via INTRAVENOUS
  Filled 2017-05-05: qty 0.5

## 2017-05-05 MED ORDER — ONDANSETRON HCL 4 MG/2ML IJ SOLN: 4.0000 mg | Freq: Four times a day (QID) | INTRAMUSCULAR | Status: DC | PRN

## 2017-05-05 MED ORDER — DIPHENHYDRAMINE HCL 25 MG PO CAPS: 25.0000 mg | ORAL_CAPSULE | Freq: Four times a day (QID) | ORAL | Status: DC | PRN

## 2017-05-05 MED ORDER — SOD CITRATE-CITRIC ACID 500-334 MG/5ML PO SOLN: 30.0000 mL | ORAL | Status: DC | PRN

## 2017-05-05 MED ORDER — LIDOCAINE HCL (PF) 1 % IJ SOLN
INTRAMUSCULAR | Status: DC | PRN
  Administered 2017-05-05: 3 mL via INTRADERMAL

## 2017-05-05 MED ORDER — ACETAMINOPHEN 325 MG PO TABS: 650.0000 mg | ORAL_TABLET | ORAL | Status: DC | PRN

## 2017-05-05 MED ORDER — DIBUCAINE 1 % RE OINT: 1.0000 "application " | TOPICAL_OINTMENT | RECTAL | Status: DC | PRN

## 2017-05-05 MED ORDER — AMMONIA AROMATIC IN INHA
RESPIRATORY_TRACT | Status: AC
  Filled 2017-05-05: qty 10

## 2017-05-05 MED ORDER — ONDANSETRON HCL 4 MG PO TABS: 4.0000 mg | ORAL_TABLET | ORAL | Status: DC | PRN

## 2017-05-05 MED ORDER — OXYTOCIN 40 UNITS IN LACTATED RINGERS INFUSION - SIMPLE MED
1.0000 m[IU]/min | INTRAVENOUS | Status: DC
  Administered 2017-05-05: 2 m[IU]/min via INTRAVENOUS

## 2017-05-05 MED ORDER — BUTORPHANOL TARTRATE 1 MG/ML IJ SOLN: 1.0000 mg | Freq: Once | INTRAMUSCULAR | Status: DC

## 2017-05-05 MED ORDER — TERBUTALINE SULFATE 1 MG/ML IJ SOLN: 0.2500 mg | Freq: Once | INTRAMUSCULAR | Status: DC | PRN

## 2017-05-05 MED ORDER — OXYTOCIN 40 UNITS IN LACTATED RINGERS INFUSION - SIMPLE MED
2.5000 [IU]/h | INTRAVENOUS | Status: DC
  Filled 2017-05-05: qty 1000

## 2017-05-05 MED ORDER — FENTANYL 2.5 MCG/ML W/ROPIVACAINE 0.15% IN NS 100 ML EPIDURAL (ARMC)
EPIDURAL | Status: DC | PRN
  Administered 2017-05-05: 12 mL/h via EPIDURAL

## 2017-05-05 MED ORDER — OXYCODONE-ACETAMINOPHEN 5-325 MG PO TABS
2.0000 | ORAL_TABLET | ORAL | Status: DC | PRN
  Administered 2017-05-06 – 2017-05-07 (×6): 2 via ORAL
  Filled 2017-05-05 (×6): qty 2

## 2017-05-05 MED ORDER — SENNOSIDES-DOCUSATE SODIUM 8.6-50 MG PO TABS
2.0000 | ORAL_TABLET | ORAL | Status: DC
  Administered 2017-05-06 – 2017-05-07 (×2): 2 via ORAL
  Filled 2017-05-05 (×2): qty 2

## 2017-05-05 MED ORDER — LACTATED RINGERS IV SOLN
INTRAVENOUS | Status: DC
  Administered 2017-05-05: 06:00:00 via INTRAVENOUS

## 2017-05-05 MED ORDER — METHYLERGONOVINE MALEATE 0.2 MG PO TABS: 0.2000 mg | ORAL_TABLET | ORAL | Status: DC | PRN

## 2017-05-05 MED ORDER — TETANUS-DIPHTH-ACELL PERTUSSIS 5-2.5-18.5 LF-MCG/0.5 IM SUSP
0.5000 mL | Freq: Once | INTRAMUSCULAR | Status: DC
  Filled 2017-05-05: qty 0.5

## 2017-05-05 MED ORDER — PRENATAL MULTIVITAMIN CH
1.0000 | ORAL_TABLET | Freq: Every day | ORAL | Status: DC
  Administered 2017-05-06: 1 via ORAL
  Filled 2017-05-05 (×2): qty 1

## 2017-05-05 MED ORDER — MISOPROSTOL 200 MCG PO TABS
ORAL_TABLET | ORAL | Status: AC
  Filled 2017-05-05: qty 4

## 2017-05-05 MED ORDER — OXYCODONE-ACETAMINOPHEN 5-325 MG PO TABS
1.0000 | ORAL_TABLET | ORAL | Status: DC | PRN
  Administered 2017-05-05 – 2017-05-07 (×3): 1 via ORAL
  Filled 2017-05-05 (×3): qty 1

## 2017-05-05 MED ORDER — BENZOCAINE-MENTHOL 20-0.5 % EX AERO
1.0000 "application " | INHALATION_SPRAY | CUTANEOUS | Status: DC | PRN
  Administered 2017-05-05: 1 via TOPICAL
  Filled 2017-05-05: qty 56

## 2017-05-05 MED ORDER — FENTANYL 2.5 MCG/ML W/ROPIVACAINE 0.15% IN NS 100 ML EPIDURAL (ARMC)
EPIDURAL | Status: AC
  Filled 2017-05-05: qty 100

## 2017-05-05 MED ORDER — SIMETHICONE 80 MG PO CHEW: 80.0000 mg | CHEWABLE_TABLET | ORAL | Status: DC | PRN

## 2017-05-05 MED ORDER — MISOPROSTOL 25 MCG QUARTER TABLET
50.0000 ug | ORAL_TABLET | ORAL | Status: DC
  Administered 2017-05-05: 50 ug via VAGINAL
  Filled 2017-05-05: qty 1

## 2017-05-05 MED ORDER — BUTORPHANOL TARTRATE 2 MG/ML IJ SOLN
INTRAMUSCULAR | Status: AC
  Filled 2017-05-05: qty 2

## 2017-05-05 MED ORDER — COCONUT OIL OIL
1.0000 "application " | TOPICAL_OIL | Status: DC | PRN
  Filled 2017-05-05: qty 120

## 2017-05-05 MED ORDER — BUPIVACAINE HCL (PF) 0.25 % IJ SOLN
INTRAMUSCULAR | Status: DC | PRN
  Administered 2017-05-05: 5 mL via EPIDURAL
  Administered 2017-05-05: 4 mL via EPIDURAL

## 2017-05-05 MED ORDER — LACTATED RINGERS IV SOLN
500.0000 mL | INTRAVENOUS | Status: DC | PRN
  Administered 2017-05-05: 500 mL via INTRAVENOUS

## 2017-05-05 MED ORDER — OXYTOCIN 10 UNIT/ML IJ SOLN
INTRAMUSCULAR | Status: AC
  Filled 2017-05-05: qty 2

## 2017-05-05 MED ORDER — SODIUM CHLORIDE 0.9 % IV SOLN: INTRAVENOUS | Status: DC

## 2017-05-05 MED ORDER — METHYLERGONOVINE MALEATE 0.2 MG/ML IJ SOLN: 0.2000 mg | INTRAMUSCULAR | Status: DC | PRN

## 2017-05-05 MED ORDER — OXYTOCIN 10 UNIT/ML IJ SOLN: 10.0000 [IU] | Freq: Once | INTRAMUSCULAR | Status: DC

## 2017-05-05 MED ORDER — DOCUSATE SODIUM 100 MG PO CAPS
100.0000 mg | ORAL_CAPSULE | Freq: Two times a day (BID) | ORAL | Status: DC
  Administered 2017-05-06 – 2017-05-07 (×3): 100 mg via ORAL
  Filled 2017-05-05 (×3): qty 1

## 2017-05-05 MED ORDER — LIDOCAINE HCL (PF) 1 % IJ SOLN
INTRAMUSCULAR | Status: AC
  Filled 2017-05-05: qty 30

## 2017-05-05 MED ORDER — OXYTOCIN BOLUS FROM INFUSION: 500.0000 mL | Freq: Once | INTRAVENOUS | Status: DC

## 2017-05-05 MED ORDER — IBUPROFEN 600 MG PO TABS
600.0000 mg | ORAL_TABLET | Freq: Four times a day (QID) | ORAL | Status: DC
  Administered 2017-05-05 – 2017-05-07 (×8): 600 mg via ORAL
  Filled 2017-05-05 (×9): qty 1

## 2017-05-05 MED ORDER — FERROUS SULFATE 325 (65 FE) MG PO TABS
325.0000 mg | ORAL_TABLET | Freq: Every day | ORAL | Status: DC
  Administered 2017-05-06 – 2017-05-07 (×2): 325 mg via ORAL
  Filled 2017-05-05 (×2): qty 1

## 2017-05-05 MED ORDER — WITCH HAZEL-GLYCERIN EX PADS: 1.0000 "application " | MEDICATED_PAD | CUTANEOUS | Status: DC | PRN

## 2017-05-05 NOTE — Progress Notes (Signed)
LABOR NOTE   Jade Palmer 27 y.o.GP@ at 1718w1d Active phase labor.  SUBJECTIVE:  Comfortable with epidural  OBJECTIVE:  BP (!) 109/54   Pulse 70   Temp (!) 97.4 F (36.3 C) (Oral)   Resp 18   Ht 5\' 7"  (1.702 m)   Wt 235 lb (106.6 kg)   LMP 08/04/2016   SpO2 100%   BMI 36.81 kg/m  Total I/O In: 1250 [I.V.:1250] Out: -   She has shown cervical change. CERVIX: 8cm:  90%:   -2:   mid position:   soft SVE:   Dilation: 8 Effacement (%): 90 Station: -2 Exam by:: Doreene BurkeAnnie Hugh Kamara, CNM CONTRACTIONS: regular, every 2-3 minutes FHR: Fetal heart tracing reviewed. Baseline: 120 bpm, Variability: Good {> 6 bpm), Accelerations: Reactive and Decelerations: Absent Category I   Analgesia: Epidural  Labs: Lab Results  Component Value Date   WBC 10.6 05/05/2017   HGB 9.4 (L) 05/05/2017   HCT 28.5 (L) 05/05/2017   MCV 87.0 05/05/2017   PLT 169 05/05/2017    ASSESSMENT: 1) Labor curve reviewed.       Progress: Active phase labor.     Membranes: ruptured, clear fluid       Active Problems:   Labor and delivery, indication for care   PLAN: continue present management  Doreene Burkennie Brittiney Dicostanzo, CNM  05/05/2017 3:13 PM

## 2017-05-05 NOTE — H&P (Signed)
History and Physical   HPI  Jade Palmer is a 27 y.o. G2P0101 at [redacted]w[redacted]d Estimated Date of Delivery: 05/11/17 who is being admitted for induction of labor. She has a history of pre eclampsia and  preterm birth with last pregnancy.She has a history of kidney stone with this pregnancy.     OB History  OB History  Gravida Para Term Preterm AB Living  2 1 0 1 0 1  SAB TAB Ectopic Multiple Live Births  0 0 0 0 1    # Outcome Date GA Lbr Len/2nd Weight Sex Delivery Anes PTL Lv  2 Current           1 Preterm 05/04/12 [redacted]w[redacted]d / 03:39 6 lb (2.722 kg) F Vag-Spont EPI  LIV     Name: [name],GIRL Kailany     Apgar1: 8  Apgar5: 9    PROBLEM LIST  Pregnancy complications or risks: Patient Active Problem List   Diagnosis Date Noted  . Labor and delivery, indication for care 04/11/2017  . Calculus of kidney affecting pregnancy 04/11/2017  . Indication for care in labor or delivery 04/09/2017  . History of preterm delivery, currently pregnant 04/09/2017  . History of kidney stones 04/09/2017  . Anemia in pregnancy 02/19/2017  . Nausea and vomiting during pregnancy prior to [redacted] weeks gestation 10/24/2016  . Pregnancy headache in first trimester 10/24/2016  . Urinary tract infection in mother during first trimester of pregnancy 10/24/2016  . Hx of preeclampsia, prior pregnancy, currently pregnant, first trimester 10/24/2016    Prenatal labs and studies: ABO, Rh: --/--/O NEG (03/29 0610) Antibody: POS (03/29 0610) Rubella: 5.08 (09/17 1129) RPR: Non Reactive (01/10 1043)  HBsAg: Negative (09/17 1129)  HIV: Non Reactive (09/17 1129)  GBS: Negative   Past Medical History:  Diagnosis Date  . Calculus of kidney affecting pregnancy 04/11/2017   Kidney stone  . Contraceptive education 04/12/2013  . History of kidney stones   . Seizures (HCC)    had seizure at age 16. Unknown etiology- no meds for this in about 5 years     Past Surgical History:  Procedure Laterality Date  . BALLOON DILATION  Right 04/25/2013   Procedure: RIGHT URETER BALLOON DILATION;  Surgeon: Ky Barban, MD;  Location: [facility];  Service: Urology;  Laterality: Right;  . CYSTOSCOPY WITH STENT PLACEMENT Left 04/12/2017   Procedure: CYSTOSCOPY;  Surgeon: Riki Altes, MD;  Location: [facility];  Service: Urology;  Laterality: Left;  . CYSTOSCOPY/RETROGRADE/URETEROSCOPY/STONE EXTRACTION WITH BASKET Right 04/25/2013   Procedure: CYSTOSCOPY/RIGHT RETROGRADE/RIGHT URETEROSCOPY/BASKET;  Surgeon: Ky Barban, MD;  Location: [facility];  Service: Urology;  Laterality: Right;  . TONSILLECTOMY    . URETEROSCOPY WITH HOLMIUM LASER LITHOTRIPSY Left 04/12/2017   Procedure: URETEROSCOPY;  Surgeon: Riki Altes, MD;  Location: [facility];  Service: Urology;  Laterality: Left;     Medications    Current Discharge Medication List    CONTINUE these medications which have NOT CHANGED   Details  acetaminophen (TYLENOL) 500 MG tablet Take 2 tablets (1,000 mg total) by mouth every 6 (six) hours as needed for moderate pain. Qty: 30 tablet, Refills: 0    cyanocobalamin (,VITAMIN B-12,) 1000 MCG/ML injection Inject 1 mL (1,000 mcg total) into the muscle once a week. Qty: 30 mL, Refills: 0    Iron-FA-B Cmp-C-Biot-Probiotic (FUSION PLUS) CAPS Take 1 tablet by mouth daily. Qty: 30 capsule, Refills: 6    Prenatal Vit-Fe Fumarate-FA (PRENATAL MULTIVITAMIN) TABS tablet Take 1 tablet  by mouth daily at 12 noon.    aspirin EC 81 MG tablet Take 1 tablet (81 mg total) by mouth daily. Take after 12 weeks for prevention of preeclampssia later in pregnancy Qty: 300 tablet, Refills: 2    ondansetron (ZOFRAN) 4 MG tablet Take 1 tablet (4 mg total) by mouth every 6 (six) hours as needed for nausea or vomiting (try second). Qty: 20 tablet, Refills: 0    promethazine (PHENERGAN) 25 MG tablet Take 1 tablet (25 mg total) by mouth every 6 (six) hours as needed for nausea or vomiting. Qty: 30 tablet, Refills: 0    SYRINGE-NEEDLE, DISP, 3 ML  (B-D 3CC LUER-LOK SYR 25GX1/2") 25G X 1-1/2" 3 ML MISC 1 mL by Does not apply route every other day. Qty: 10 each, Refills: 0         Allergies  Patient has no known allergies.  Review of Systems  Constitutional: negative Eyes: negative Ears, nose, mouth, throat, and face: negative Respiratory: negative Cardiovascular: negative Gastrointestinal: negative Genitourinary:negative Integument/breast: negative Hematologic/lymphatic: negative Musculoskeletal:negative Neurological: negative Behavioral/Psych: negative Endocrine: negative Allergic/Immunologic: negative  Physical Exam  BP 130/83 (BP Location: Right Arm)   Pulse 70   Temp 98.1 F (36.7 C) (Oral)   Resp 18   Ht 5\' 7"  (1.702 m)   Wt 235 lb (106.6 kg)   LMP 08/04/2016   BMI 36.81 kg/m   Lungs:  CTA  Cardio: RRR  Abd: Soft, gravid, NT Presentation: cephalic EXT: No C/C/ 1+ Edema DTRs: 2+ B CERVIX: 4cm  :  80%:   -2:    mid position:    Firm No visual disturbances, no headache, no epigastric pain    See Prenatal records for more detailed PE.     FHR:  Baseline: 120 bpm, Variability: Good {> 6 bpm), Accelerations: Reactive and Decelerations: Absent  Toco: Uterine Contractions: irregular   Test Results  Results for orders placed or performed during the hospital encounter of 05/05/17 (from the past 24 hour(s))  Type and screen     Status: None   Collection Time: 05/05/17  6:10 AM  Result Value Ref Range   ABO/RH(D) O NEG    Antibody Screen POS    Sample Expiration 05/08/2017    Antibody Identification      PASSIVELY ACQUIRED ANTI-D Performed at [facility], [address] [place], [place] [address]   CBC     Status: Abnormal   Collection Time: 05/05/17  8:00 AM  Result Value Ref Range   WBC 10.6 3.6 - 11.0 K/uL   RBC 3.27 (L) 3.80 - 5.20 MIL/uL   Hemoglobin 9.4 (L) 12.0 - 16.0 g/dL   HCT 19.1 (L) 47.8 - 29.5 %   MCV 87.0 80.0 - 100.0 fL   MCH 28.6 26.0 - 34.0 pg   MCHC 32.9  32.0 - 36.0 g/dL   RDW 62.1 (H) 30.8 - 65.7 %   Platelets 169 150 - 440 K/uL   Group B Strep negative, Rh negative  Assessment   G2P0101 at [redacted]w[redacted]d Estimated Date of Delivery: 05/11/17  The fetus is reassuring.   Patient Active Problem List   Diagnosis Date Noted  . Labor and delivery, indication for care 04/11/2017  . Calculus of kidney affecting pregnancy 04/11/2017  . Indication for care in labor or delivery 04/09/2017  . History of preterm delivery, currently pregnant 04/09/2017  . History of kidney stones 04/09/2017  . Anemia in pregnancy 02/19/2017  . Nausea and vomiting during pregnancy prior to  [redacted] weeks gestation 10/24/2016  . Pregnancy headache in first trimester 10/24/2016  . Urinary tract infection in mother during first trimester of pregnancy 10/24/2016  . Hx of preeclampsia, prior pregnancy, currently pregnant, first trimester 10/24/2016    Plan  1. Admit to L&D :   [facility]- clear fluid , continue present management 2. EFM:-- Category 1 3. Epidural if desired. Stadol for IV pain until epidural requested. 4. Admission labs  5.Anticipate NSVD  Doreene Burkennie Royce Stegman, CNM  05/05/2017 9:22 AM

## 2017-05-05 NOTE — Progress Notes (Signed)
LABOR NOTE   Jade Palmer 26 y.o.GP@ at 5245w1d Early latent labor.  SUBJECTIVE:  Uncomfortable with contractions, has had dose of IV pain medication OBJECTIVE:  BP (!) 150/83 (BP Location: Right Arm)   Pulse 80   Temp 98.6 F (37 C) (Oral)   Resp 18   Ht 5\' 7"  (1.702 m)   Wt 235 lb (106.6 kg)   LMP 08/04/2016   BMI 36.81 kg/m  No intake/output data recorded.   CERVIX: not evaluated:   SVE:   Dilation: 3.5 Effacement (%): 80 Station: -2 Exam by:: Dannielle Huh. Fairley CONTRACTIONS: regular, every 2-543minutes FHR: Fetal heart tracing reviewed. Baseline: 120 bpm, Variability: Good {> 6 bpm), Accelerations: Reactive and Decelerations: Absent Category I   Analgesia: IV pain meds  Labs: Lab Results  Component Value Date   WBC 10.6 05/05/2017   HGB 9.4 (L) 05/05/2017   HCT 28.5 (L) 05/05/2017   MCV 87.0 05/05/2017   PLT 169 05/05/2017    ASSESSMENT: 1) Labor curve reviewed.       Progress: Early latent labor.     Membranes: ruptured, clear fluid       Active Problems:   Labor and delivery, indication for care Anemia in pregnancy   PLAN: continue present management and IV Pitocin augmentation  Doreene Burkennie Burnett Spray, CNM  05/05/2017 11:40 AM

## 2017-05-05 NOTE — Progress Notes (Signed)
San JettyLetitia Elks, RN in room 1226 to adjust pt monitor. Pt eating chicken and fries. RN reinforced education regarding clear liquid diet and educated pt on risks of eating in labor.

## 2017-05-05 NOTE — Anesthesia Procedure Notes (Signed)
Epidural Patient location during procedure: OB Start time: 05/05/2017 1:33 PM End time: 05/05/2017 1:57 PM  Staffing Anesthesiologist: Naomie DeanKephart, William K, MD Resident/CRNA: Ginger CarneMichelet, Teofilo Lupinacci, CRNA Performed: resident/CRNA   Preanesthetic Checklist Completed: patient identified, site marked, surgical consent, pre-op evaluation, timeout performed, IV checked, risks and benefits discussed and monitors and equipment checked  Epidural Patient position: sitting Prep: ChloraPrep Patient monitoring: heart rate, continuous pulse ox and blood pressure Approach: midline Location: L3-L4 Injection technique: LOR saline  Needle:  Needle type: Tuohy  Needle gauge: 17 G Needle length: 9 cm and 9 Needle insertion depth: 9 cm Catheter type: closed end flexible Catheter size: 19 Gauge Catheter at skin depth: 14 cm Test dose: negative and 1.5% lidocaine with Epi 1:200 K  Assessment Sensory level: T10 Events: blood not aspirated, injection not painful, no injection resistance, negative IV test and no paresthesia  Additional Notes 1 attempt Pt. Evaluated and documentation done after procedure finished. Patient identified. Risks/Benefits/Options discussed with patient including but not limited to bleeding, infection, nerve damage, paralysis, failed block, incomplete pain control, headache, blood pressure changes, nausea, vomiting, reactions to medication both or allergic, itching and postpartum back pain. Confirmed with bedside nurse the patient's most recent platelet count. Confirmed with patient that they are not currently taking any anticoagulation, have any bleeding history or any family history of bleeding disorders. Patient expressed understanding and wished to proceed. All questions were answered. Sterile technique was used throughout the entire procedure. Please see nursing notes for vital signs. Test dose was given through epidural catheter and negative prior to continuing to dose epidural or  start infusion. Warning signs of high block given to the patient including shortness of breath, tingling/numbness in hands, complete motor block, or any concerning symptoms with instructions to call for help. Patient was given instructions on fall risk and not to get out of bed. All questions and concerns addressed with instructions to call with any issues or inadequate analgesia.   Patient tolerated the insertion well without immediate complications.Reason for block:procedure for pain

## 2017-05-06 LAB — BPAM RBC
Blood Product Expiration Date: 201904292359
Blood Product Expiration Date: 201904292359
Unit Type and Rh: 9500
Unit Type and Rh: 9500

## 2017-05-06 LAB — TYPE AND SCREEN
ABO/RH(D): O NEG
ANTIBODY SCREEN: POSITIVE
UNIT DIVISION: 0
Unit division: 0

## 2017-05-06 LAB — RPR: RPR: NONREACTIVE

## 2017-05-06 LAB — CBC
HCT: 29.7 % — ABNORMAL LOW (ref 35.0–47.0)
Hemoglobin: 9.7 g/dL — ABNORMAL LOW (ref 12.0–16.0)
MCH: 28.5 pg (ref 26.0–34.0)
MCHC: 32.7 g/dL (ref 32.0–36.0)
MCV: 87.3 fL (ref 80.0–100.0)
PLATELETS: 161 10*3/uL (ref 150–440)
RBC: 3.41 MIL/uL — AB (ref 3.80–5.20)
RDW: 15.8 % — ABNORMAL HIGH (ref 11.5–14.5)
WBC: 9.4 10*3/uL (ref 3.6–11.0)

## 2017-05-06 MED ORDER — DOCUSATE SODIUM 100 MG PO CAPS: 100.0000 mg | ORAL_CAPSULE | Freq: Two times a day (BID) | ORAL | 0 refills | Status: DC

## 2017-05-06 MED ORDER — IBUPROFEN 600 MG PO TABS: 600.0000 mg | ORAL_TABLET | Freq: Four times a day (QID) | ORAL | 0 refills | Status: DC

## 2017-05-06 NOTE — Discharge Summary (Signed)
Discharge Summary  Date of Admission: 05/05/2017  Date of Discharge: 05/06/2017  Admitting Diagnosis: Induction of labor at 6736w1d  Mode of Delivery: normal spontaneous vaginal delivery                 Discharge Diagnosis: No other diagnosis   Intrapartum Procedures: none   Post partum procedures: rhogam not indicated baby A negative  Complications: none                      Discharge Day SOAP Note:  Progress Note - Vaginal Delivery  Jade Palmer is a 27 y.o. G2P1102 now PP day 1 s/p Vaginal, Spontaneous . Delivery was uncomplicated  Subjective  The patient has the following complaints: has no unusual complaints  Pain is controlled with current medications.   Patient is urinating without difficulty.  She is ambulating well.    Objective  Vital signs: BP 120/77 (BP Location: Right Arm)   Pulse 70   Temp 97.7 F (36.5 C) (Oral)   Resp 18   Ht 5\' 7"  (1.702 m)   Wt 235 lb (106.6 kg)   LMP 08/04/2016   SpO2 100%   Breastfeeding? Unknown   BMI 36.81 kg/m   Physical Exam: Gen: NAD Fundus Fundal Tone: Firm  Lochia Amount: Small  Perineum Appearance: Approximated     Data Review Labs: CBC Latest Ref Rng & Units 05/06/2017 05/05/2017 05/02/2017  WBC 3.6 - 11.0 K/uL 9.4 10.6 9.3  Hemoglobin 12.0 - 16.0 g/dL 1.6(X9.7(L) 0.9(U9.4(L) 10.1(L)  Hematocrit 35.0 - 47.0 % 29.7(L) 28.5(L) 29.8(L)  Platelets 150 - 440 K/uL 161 169 184   O NEG  Assessment/Plan  Active Problems:   Labor and delivery, indication for care    Plan for discharge today.   Discharge Instructions: Per After Visit Summary. Activity: Advance as tolerated. Pelvic rest for 6 weeks.  Also refer to After Visit Summary Diet: Regular Medications: Allergies as of 05/06/2017   No Known Allergies     Medication List    STOP taking these medications   aspirin EC 81 MG tablet   cyanocobalamin 1000 MCG/ML injection Commonly known as:  (VITAMIN B-12)   ondansetron 4 MG  tablet Commonly known as:  ZOFRAN   promethazine 25 MG tablet Commonly known as:  PHENERGAN   SYRINGE-NEEDLE (DISP) 3 ML 25G X 1-1/2" 3 ML Misc Commonly known as:  B-D 3CC LUER-LOK SYR 25GX1/2"     TAKE these medications   acetaminophen 500 MG tablet Commonly known as:  TYLENOL Take 2 tablets (1,000 mg total) by mouth every 6 (six) hours as needed for moderate pain.   docusate sodium 100 MG capsule Commonly known as:  COLACE Take 1 capsule (100 mg total) by mouth 2 (two) times daily.   FUSION PLUS Caps Take 1 tablet by mouth daily.   ibuprofen 600 MG tablet Commonly known as:  ADVIL,MOTRIN Take 1 tablet (600 mg total) by mouth every 6 (six) hours.   prenatal multivitamin Tabs tablet Take 1 tablet by mouth daily at 12 noon.      Outpatient follow up:  Postpartum contraception: Will discuss at first office visit post-partum  Discharged Condition: good  Discharged to: home  Newborn Data: Disposition:home with mother  Apgars: APGAR (1 MIN): 8   APGAR (5 MINS): 9   APGAR (10 MINS):    Baby Feeding: Breast    Doreene Burkennie Author [place]lestad, CNM  05/06/2017 8:41 AM

## 2017-05-06 NOTE — Anesthesia Postprocedure Evaluation (Signed)
Anesthesia Post Note  [facility]  Procedure(s) Performed: AN AD HOC LABOR EPIDURAL  Patient location during evaluation: Mother Baby Anesthesia Type: Epidural Level of consciousness: awake and alert Pain management: pain level controlled Vital Signs Assessment: post-procedure vital signs reviewed and stable Respiratory status: spontaneous breathing, nonlabored ventilation and respiratory function stable Cardiovascular status: stable Postop Assessment: no headache, no backache and epidural receding Anesthetic complications: no     Last Vitals:  Vitals:   05/06/17 0328 05/06/17 0805  BP: 114/65 120/77  Pulse: 78 70  Resp: 18 18  Temp: 36.6 C 36.5 C  SpO2: 99% 100%    Last Pain:  Vitals:   05/06/17 0805  TempSrc: Oral  PainSc:                  Latina Frank S

## 2017-05-06 NOTE — Final Progress Note (Signed)
Discharge Day SOAP Note:  Progress Note - Vaginal Delivery  Roddie Mcshley M Due is a 27 y.o. G2P1102 now PP day 1 s/p Vaginal, Spontaneous . Delivery was uncomplicated  Subjective  The patient has the following complaints: has no unusual complaints  Pain is controlled with current medications.   Patient is urinating without difficulty.  She is ambulating well.    Objective  Vital signs: BP 120/77 (BP Location: Right Arm)   Pulse 70   Temp 97.7 F (36.5 C) (Oral)   Resp 18   Ht 5\' 7"  (1.702 m)   Wt 235 lb (106.6 kg)   LMP 08/04/2016   SpO2 100%   Breastfeeding? Unknown   BMI 36.81 kg/m   Physical Exam: Gen: NAD Fundus Fundal Tone: Firm  Lochia Amount: Small  Perineum Appearance: Approximated                Data Review Labs: CBC Latest Ref Rng & Units 05/06/2017 05/05/2017 05/02/2017  WBC 3.6 - 11.0 K/uL 9.4 10.6 9.3  Hemoglobin 12.0 - 16.0 g/dL 0.9(W9.7(L) 1.1(B9.4(L) 10.1(L)  Hematocrit 35.0 - 47.0 % 29.7(L) 28.5(L) 29.8(L)  Platelets 150 - 440 K/uL 161 169 184   O NEG  Assessment/Plan  Active Problems:   Labor and delivery, indication for care    Plan for discharge today.   Discharge Instructions: Per After Visit Summary. Activity: Advance as tolerated. Pelvic rest for 6 weeks.  Also refer to After Visit Summary Diet: Regular Medications: Allergies as of 05/06/2017   No Known Allergies        Medication List    STOP taking these medications   aspirin EC 81 MG tablet   cyanocobalamin 1000 MCG/ML injection Commonly known as:  (VITAMIN B-12)   ondansetron 4 MG tablet Commonly known as:  ZOFRAN   promethazine 25 MG tablet Commonly known as:  PHENERGAN   SYRINGE-NEEDLE (DISP) 3 ML 25G X 1-1/2" 3 ML Misc Commonly known as:  B-D 3CC LUER-LOK SYR 25GX1/2"     TAKE these medications   acetaminophen 500 MG tablet Commonly known as:  TYLENOL Take 2 tablets (1,000 mg total) by mouth every 6 (six) hours as needed for moderate pain.    docusate sodium 100 MG capsule Commonly known as:  COLACE Take 1 capsule (100 mg total) by mouth 2 (two) times daily.   FUSION PLUS Caps Take 1 tablet by mouth daily.   ibuprofen 600 MG tablet Commonly known as:  ADVIL,MOTRIN Take 1 tablet (600 mg total) by mouth every 6 (six) hours.   prenatal multivitamin Tabs tablet Take 1 tablet by mouth daily at 12 noon.      Outpatient follow up:  Postpartum contraception: Will discuss at first office visit post-partum  Discharged Condition: good  Discharged to: home  Newborn Data: Disposition:home with mother  Apgars: APGAR (1 MIN): 8   APGAR (5 MINS): 9   APGAR (10 MINS):    Baby Feeding: Breast    Doreene Burkennie Zanai Mallari, CNM  05/06/2017 8:41 AM

## 2017-05-07 NOTE — Progress Notes (Signed)
Reviewed D/C instructions with pt and family. Pt verbalized understanding of teaching. Discharged to home via W/C. Pt to schedule f/u appt.  

## 2017-05-07 NOTE — Plan of Care (Signed)
Alert and oriented with aprop. Affect. Color good, skin w&d. BBS clear. Fundus is firm at U/-1 and small amount of Lochia. Appears to be bonding wel with Infant. Demonstrates aprop. Care of self. Requiring scheduled Ibuprofen and PRN pain medication q4h for c/o intermittent cramping with Breast feeding.

## 2017-05-07 NOTE — Final Progress Note (Signed)
Discharge Day SOAP Note:  Progress Note - Vaginal Delivery  Jade Palmer a 27 y.o.G2P1102 now PP daBlanchie Servey 2s/p Vaginal, Spontaneous. Delivery wasuncomplicated. She was going to go home yesterday but decided to stay for additional lactation support.   Subjective  The patient has the following complaints:has no unusual complaints Pain iscontrolledwith current medications. Patient is urinating without difficulty. She isambulating well.   Objective  Vital signs: BP 120/77 (BP Location: Right Arm)  Pulse 70  Temp 97.7 F (36.5 C) (Oral)  Resp 18  Ht 5\' 7"  (1.702 m)  Wt 235 lb (106.6 kg)  LMP 08/04/2016  SpO2 100%  Breastfeeding? Unknown  BMI 36.81 kg/m   Physical Exam: Gen: NAD Fundus Fundal Tone: Firm  Lochia Amount: Small  Perineum Appearance: Approximated    Data Review Labs: CBC Latest Ref Rng & Units 05/06/2017 05/05/2017 05/02/2017  WBC 3.6 - 11.0 K/uL 9.4 10.6 9.3  Hemoglobin 12.0 - 16.0 g/dL 9.6(E9.7(L) 4.5(W9.4(L) 10.1(L)  Hematocrit 35.0 - 47.0 % 29.7(L) 28.5(L) 29.8(L)  Platelets 150 - 440 K/uL 161 169 184   O NEG  Assessment/Plan  Active Problems: Labor and delivery, indication for care   Plan for discharge today.   Discharge Instructions:Per After Visit Summary. Activity:Advance as tolerated. Pelvic rest for 6 weeks. Also refer to After Visit Summary Diet: Regular Medications: Allergies as of 05/06/2017  No Known Allergies        Medication List    STOP taking these medications  aspirin EC81 MG tablet   cyanocobalamin1000 MCG/ML injection Commonly known as: (VITAMIN B-12)   ondansetron4 MG tablet Commonly known as: ZOFRAN   promethazine25 MG tablet Commonly known as: PHENERGAN   SYRINGE-NEEDLE (DISP) 3 ML25G X 1-1/2" 3 ML Misc Commonly known as: B-D 3CC LUER-LOK SYR 25GX1/2"     TAKE these medications  acetaminophen500 MG tablet Commonly known as: TYLENOL Take 2 tablets  (1,000 mg total) by mouth every 6 (six) hours as needed for moderate pain.   docusate sodium100 MG capsule Commonly known as: COLACE Take 1 capsule (100 mg total) by mouth 2 (two) times daily.   FUSION PLUSCaps Take 1 tablet by mouth daily.   ibuprofen600 MG tablet Commonly known as: ADVIL,MOTRIN Take 1 tablet (600 mg total) by mouth every 6 (six) hours.   prenatal multivitaminTabs tablet Take 1 tablet by mouth daily at 12 noon.      Outpatient follow up:  Postpartum contraception:Will discuss at first office visit post-partum  Discharged Condition:good  Discharged UJ:WJXBto:home  Newborn Data: Disposition:home with mother  Apgars: APGAR (1 MIN): 8  APGAR (5 MINS): 9  APGAR (10 MINS):   Baby Feeding:Breast   Doreene BurkeAnnie Onyx Edgley, CNM

## 2017-05-07 NOTE — Discharge Summary (Signed)
Discharge Day SOAP Note:  Progress Note - Vaginal Delivery  Blanchie Serveshley M KLUKis a 27 y.o.G2P1102 now PP day 2s/p Vaginal, Spontaneous. Delivery wasuncomplicated. She was going to go home yesterday but decided to stay for additional lactation support. There is no change in her status.   Subjective  The patient has the following complaints:has no unusual complaints Pain iscontrolledwith current medications. Patient is urinating without difficulty. She isambulating well.   Objective  Vital signs: BP 120/77 (BP Location: Right Arm)  Pulse 70  Temp 97.7 F (36.5 C) (Oral)  Resp 18  Ht 5\' 7"  (1.702 m)  Wt 235 lb (106.6 kg)  LMP 08/04/2016  SpO2 100%  Breastfeeding? Unknown  BMI 36.81 kg/m   Physical Exam: Gen: NAD Fundus Fundal Tone: Firm  Lochia Amount: Small  Perineum Appearance: Approximated    Data Review Labs: CBC Latest Ref Rng & Units 05/06/2017 05/05/2017 05/02/2017  WBC 3.6 - 11.0 K/uL 9.4 10.6 9.3  Hemoglobin 12.0 - 16.0 g/dL 4.7(W9.7(L) 2.9(F9.4(L) 10.1(L)  Hematocrit 35.0 - 47.0 % 29.7(L) 28.5(L) 29.8(L)  Platelets 150 - 440 K/uL 161 169 184   O NEG  Assessment/Plan  Active Problems: Labor and delivery, indication for care   Plan for discharge today.   Discharge Instructions:Per After Visit Summary. Activity:Advance as tolerated. Pelvic rest for 6 weeks. Also refer to After Visit Summary Diet: Regular Medications: Allergies as of 05/06/2017  No Known Allergies        Medication List    STOP taking these medications  aspirin EC81 MG tablet   cyanocobalamin1000 MCG/ML injection Commonly known as: (VITAMIN B-12)   ondansetron4 MG tablet Commonly known as: ZOFRAN   promethazine25 MG tablet Commonly known as: PHENERGAN   SYRINGE-NEEDLE (DISP) 3 ML25G X 1-1/2" 3 ML Misc Commonly known as: B-D 3CC LUER-LOK SYR 25GX1/2"     TAKE these medications  acetaminophen500 MG tablet Commonly  known as: TYLENOL Take 2 tablets (1,000 mg total) by mouth every 6 (six) hours as needed for moderate pain.   docusate sodium100 MG capsule Commonly known as: COLACE Take 1 capsule (100 mg total) by mouth 2 (two) times daily.   FUSION PLUSCaps Take 1 tablet by mouth daily.   ibuprofen600 MG tablet Commonly known as: ADVIL,MOTRIN Take 1 tablet (600 mg total) by mouth every 6 (six) hours.   prenatal multivitaminTabs tablet Take 1 tablet by mouth daily at 12 noon.      Outpatient follow up:  Postpartum contraception:Will discuss at first office visit post-partum  Discharged Condition:good  Discharged AO:ZHYQto:home  Newborn Data: Disposition:home with mother  Apgars: APGAR (1 MIN): 8  APGAR (5 MINS): 9  APGAR (10 MINS):   Baby Feeding:Breast   Doreene BurkeAnnie Loana Salvaggio, CNM

## 2017-05-08 ENCOUNTER — Encounter: Payer: No Typology Code available for payment source | Admitting: Certified Nurse Midwife

## 2017-05-08 NOTE — Anesthesia Preprocedure Evaluation (Signed)
Anesthesia Evaluation  Patient identified by MRN, date of birth, ID band Patient awake    Reviewed: Allergy & Precautions, H&P , NPO status , Unable to perform ROS - Chart review only  Airway Mallampati: II  TM Distance: >3 FB   Mouth opening: Limited Mouth Opening  Dental  (+) Teeth Intact   Pulmonary neg pulmonary ROS,    Pulmonary exam normal        Cardiovascular Exercise Tolerance: Good negative cardio ROS Normal cardiovascular exam     Neuro/Psych  Headaches, Seizures -, Well Controlled,  Only 1 sz at age 27    GI/Hepatic negative GI ROS, Neg liver ROS,   Endo/Other  negative endocrine ROS  Renal/GU negative Renal ROSKidney stones     Musculoskeletal   Abdominal   Peds  Hematology negative hematology ROS (+) anemia ,   Anesthesia Other Findings   Reproductive/Obstetrics (+) Pregnancy                             Anesthesia Physical Anesthesia Plan  ASA: II  Anesthesia Plan: Epidural   Post-op Pain Management:    Induction:   PONV Risk Score and Plan:   Airway Management Planned:   Additional Equipment:   Intra-op Plan:   Post-operative Plan:   Informed Consent: I have reviewed the patients History and Physical, chart, labs and discussed the procedure including the risks, benefits and alternatives for the proposed anesthesia with the patient or authorized representative who has indicated his/her understanding and acceptance.   Dental Advisory Given  Plan Discussed with: Anesthesiologist  Anesthesia Plan Comments:         Anesthesia Quick Evaluation

## 2017-06-03 ENCOUNTER — Other Ambulatory Visit: Payer: Self-pay | Admitting: Certified Nurse Midwife

## 2017-07-14 ENCOUNTER — Encounter: Payer: Self-pay | Admitting: Certified Nurse Midwife

## 2017-07-14 ENCOUNTER — Ambulatory Visit (INDEPENDENT_AMBULATORY_CARE_PROVIDER_SITE_OTHER): Payer: No Typology Code available for payment source | Admitting: Certified Nurse Midwife

## 2017-07-14 MED ORDER — SERTRALINE HCL 25 MG PO TABS: 25.0000 mg | ORAL_TABLET | Freq: Every day | ORAL | 0 refills | Status: DC

## 2017-07-14 NOTE — Patient Instructions (Signed)
Preventing Cervical Cancer Cervical cancer is cancer that grows on the cervix. The cervix is at the bottom of the uterus. It connects the uterus to the vagina. The uterus is where a baby develops during pregnancy. Cancer occurs when cells become abnormal and start to grow out of control. Cervical cancer grows slowly and may not cause any symptoms at first. Over time, the cancer can grow deep into the cervix tissue and spread to other areas. If it is found early, cervical cancer can be treated effectively. You can also take steps to prevent this type of cancer. Most cases of cervical cancer are caused by an STI (sexually transmitted infection) called human papillomavirus (HPV). One way to reduce your risk of cervical cancer is to avoid infection with the HPV virus. You can do this by practicing safe sex and by getting the HPV vaccine. Getting regular Pap tests is also important because this can help identify changes in cells that could lead to cancer. Your chances of getting this disease can also be reduced by making certain lifestyle changes. How can I protect myself from cervical cancer? Preventing HPV infection  Ask your health care provider about getting the HPV vaccine. If you are 26 years old or younger, you may need to get this vaccine, which is given in three doses over 6 months. This vaccine protects against the types of HPV that could cause cancer.  Limit the number of people you have sex with. Also avoid having sex with people who have had many sex partners.  Use a latex condom during sex. Getting Pap tests  Get Pap tests regularly, starting at age 21. Talk with your health care provider about how often you need these tests. ? Most women who are 21?29 years of age should have a Pap test every 3 years. ? Most women who are 30?28 years of age should have a Pap test in combination with an HPV test every 5 years. ? Women with a higher risk of cervical cancer, such as those with a weakened  immune system or those who have been exposed to the drug diethylstilbestrol (DES), may need more frequent testing. Making other lifestyle changes  Do not use any products that contain nicotine or tobacco, such as cigarettes and e-cigarettes. If you need help quitting, ask your health care provider.  Eat at least 5 servings of fruits and vegetables every day.  Lose weight if you are overweight. Why are these changes important?  These changes and screening tests are designed to address the factors that are known to increase the risk of cervical cancer. Taking these steps is the best way to reduce your risk.  Having regular Pap tests will help identify changes in cells that could lead to cancer. Steps can then be taken to prevent cancer from developing.  These changes will also help find cervical cancer early. This type of cancer can be treated effectively if it is found early. It can be more dangerous and difficult to treat if cancer has grown deep into your cervix or has spread.  In addition to making you less likely to get cervical cancer, these changes will also provide other health benefits, such as the following: ? Practicing safe sex is important for preventing STIs and unplanned pregnancies. ? Avoiding tobacco can reduce your risk for other cancers and health issues. ? Eating a healthy diet and maintaining a healthy weight are good for your overall health. What can happen if changes are not made? In the   early stages, cervical cancer might not have any symptoms. It can take many years for the cancer to grow and get deep into the cervix tissue. This may be happening without you knowing about it. If you develop any symptoms, such as pelvic pain or unusual discharge or bleeding from your vagina, you should see your health care provider right away. If cervical cancer is not found early, you might need treatments such as radiation, chemotherapy, or surgery. In some cases, surgery may mean that  you will not be able to get pregnant or carry a pregnancy to term. Where to find support: Talk with your health care provider, school nurse, or local health department for guidance about screening and vaccination. Some children and teens may be able to get the HPV vaccine free of charge through the U.[place] government's Vaccines for Children (VFC) program. Other places that provide vaccinations include:  Public health clinics. Check with your local health department.  Federally Qualified Health Centers, where you would pay only what you can afford. To find one near you, check this website: www.fqhc.org/find-an-fqhc/  [facility]. These are part of a program for Medicare and Medicaid patients who live in rural areas.  The National Breast and Cervical Cancer Early Detection Program also provides breast and cervical cancer screenings and diagnostic services to low-income, uninsured, and underinsured women. Cervical cancer can be passed down through families. Talk with your health care provider or genetic counselor to learn more about genetic testing for cancer. Where to find more information: Learn more about cervical cancer from:  American College of Gynecology: www.acog.org/Patients/FAQs/Cervical-Cancer  American Cancer Society: www.cancer.org/cancer/cervicalcancer/  U.S. Centers for Disease Control and Prevention: www.cdc.gov/cancer/cervical/  Summary  Talk with your health care provider about getting the HPV vaccine.  Be sure to get regular Pap tests as recommended by your health care provider.  See your health care provider right away if you have any pelvic pain or unusual discharge or bleeding from your vagina. This information is not intended to replace advice given to you by your health care provider. Make sure you discuss any questions you have with your health care provider. Document Released: 02/08/2015 Document Revised: 09/22/2015 Document Reviewed: 09/22/2015 Elsevier  Interactive Patient Education  2018 Elsevier Inc.  

## 2017-07-14 NOTE — Progress Notes (Signed)
Pt is here for a post partum visit. Trying to breast feed, has not had a period, would like a Mirena, has resumed intercourse. Screening 4

## 2017-07-14 NOTE — Progress Notes (Signed)
Subjective:    Jade Palmer is a 27 y.o. [id number] Caucasian female who presents for a postpartum visit. She is 7 weeks postpartum following a spontaneous vaginal delivery at 39.1 gestational weeks. Anesthesia: epidural. I have fully reviewed the prenatal and intrapartum course. Postpartum course has been WNL. Baby's course has been WNL. Baby is feeding by breast and bottle. Bleeding no bleeding. Bowel function is normal. Bladder function is normal. Patient is sexually active. Last sexual activity: a week ago Contraception method is condoms. Considering IUD Postpartum depression screening: negative. Score 4. She complains of anxiety. She has a history of anxiety and was on medication in the past. She would like to take something that would be compatable with breast feeding. Discussed use of SSRI with breastfeeding. Reviewed benefits and risk.   Last pap 04/20/16 and was negative.  The following portions of the patient's history were reviewed and updated as appropriate: allergies, current medications, past medical history, past surgical history and problem list.  Review of Systems Pertinent items are noted in HPI.   Vitals:   07/14/17 1000  BP: 128/84  Pulse: 72  Weight: 210 lb 7 oz (95.5 kg)  Height: 5\' 7"  (1.702 m)   Patient's last menstrual period was 08/04/2016.  Objective:   General:  alert, cooperative and no distress   Breasts:  deferred, no complaints  Lungs: clear to auscultation bilaterally  Heart:  regular rate and rhythm  Abdomen: soft, nontender   Vulva: normal  Vagina: normal vagina  Cervix:  closed  Corpus: Well-involuted  Adnexa:  Non-palpable  Rectal Exam: no hemorrhoids        Assessment:   Postpartum exam 7 wks s/p NSVD Breastfeeding & bottle Depression screening negative Contraception counseling   Plan:  : condoms. Discussed IUD  Zoloft 25 mg po daily started for anxiety. Follow up in: 4 weeks for IUD placement and Medication check. or earlier if  needed  Doreene BurkeAnnie Cloee Dunwoody, CNM

## 2017-08-09 ENCOUNTER — Other Ambulatory Visit: Payer: Self-pay

## 2017-08-11 ENCOUNTER — Ambulatory Visit (INDEPENDENT_AMBULATORY_CARE_PROVIDER_SITE_OTHER): Payer: No Typology Code available for payment source | Admitting: Certified Nurse Midwife

## 2017-08-11 ENCOUNTER — Other Ambulatory Visit: Payer: Self-pay | Admitting: Certified Nurse Midwife

## 2017-08-11 ENCOUNTER — Ambulatory Visit (INDEPENDENT_AMBULATORY_CARE_PROVIDER_SITE_OTHER): Payer: No Typology Code available for payment source

## 2017-08-11 ENCOUNTER — Encounter: Payer: Self-pay | Admitting: Certified Nurse Midwife

## 2017-08-11 VITALS — BP 126/79 | HR 75 | Ht 67.0 in | Wt 216.0 lb

## 2017-08-11 DIAGNOSIS — O3480 Maternal care for other abnormalities of pelvic organs, unspecified trimester: Secondary | ICD-10-CM

## 2017-08-11 DIAGNOSIS — N926 Irregular menstruation, unspecified: Secondary | ICD-10-CM

## 2017-08-11 DIAGNOSIS — R300 Dysuria: Secondary | ICD-10-CM | POA: Diagnosis not present

## 2017-08-11 DIAGNOSIS — N912 Amenorrhea, unspecified: Secondary | ICD-10-CM

## 2017-08-11 DIAGNOSIS — N8311 Corpus luteum cyst of right ovary: Secondary | ICD-10-CM | POA: Diagnosis not present

## 2017-08-11 LAB — POCT URINALYSIS DIPSTICK
BILIRUBIN UA: NEGATIVE
Blood, UA: NEGATIVE
GLUCOSE UA: NEGATIVE
Ketone: NEGATIVE
Nitrite, UA: NEGATIVE
ODOR: NEGATIVE
Protein, UA: NEGATIVE
Spec Grav, UA: 1.015 (ref 1.010–1.025)
Urobilinogen, UA: 0.2 E.U./dL
pH, UA: 6 (ref 5.0–8.0)

## 2017-08-11 LAB — POCT URINE PREGNANCY: Preg Test, Ur: NEGATIVE

## 2017-08-11 NOTE — Progress Notes (Signed)
Subjective:    Roddie Mcshley M Schmuhl is a 27 y.o. female who presents for evaluation of menstrual irregularity, faint line on home pregnancy test.. She believes she could be pregnant. Patient is ambivalent about pregnancy. Sexual Activity: single partner, contraception: none. . Last period was abnormal she had baby 3 months ago.    No LMP recorded (lmp unknown). The following portions of the patient's history were reviewed and updated as appropriate: allergies, current medications, past family history, past medical history, past social history, past surgical history and problem list.  Review of Systems Pertinent items are noted in HPI.     Objective:    BP 126/79   Pulse 75   Ht 5\' 7"  (1.702 m)   Wt 216 lb (98 kg)   LMP  (LMP Unknown)   Breastfeeding? No   BMI 33.83 kg/m  General: alert, cooperative, appears stated age and no acute distress    ULTRASOUND REPORT  Location: [facility] Date of Service:  08/11/2017  Indications: Dating/Viability Findings:  No products of conception (gestational sac or fetal pole) are identified at this time, however, the endometrium appears thickened and a possible corpus luteal cyst is identified on the right ovary.  The uterus measures 8.5 x 5.1 x 4.5 cm The echo texture appears homogeneous without evidence of focal masses. The endometrium measures 16.5 mm.  ? Early pregnancy  Right Ovary measures 3.6 x 2.4 x 2.2 cm and contains a possible corpus luteal cyst. It is normal in appearance. Left Ovary measures 3.0 x 2.2 x 2.0 cm. It is normal appearance. There is evidence of a corpus luteal cyst in the right ovary. Survey of the adnexa demonstrates no adnexal masses. There is no free peritoneal fluid in the cul de sac.  Impression: 1. The uterus appears of normal size and contour. 2. The endometrium appears thickened at 16.5 mm.  ? Early pregnancy 3. Bilateral ovaries appear WNL.  Right ovary appears to contain a possible corpus luteal  cyst.  Recommendations: 1.Clinical correlation with the patient's History and Physical Exam. 2. Beta and repeat ultrasound in 2-3 weeks.   Kari BaarsJill Long, RDMS Lab Review Urine HCG: negative    Assessment:    Absence of menstruation.     Plan:    Pregnancy Test: Negative: Patient reassured. Contraceptive counseling done. Planned contraceptive method: IUD. If menses has not started in two weeks return to clinic for another pregnancy test.   Beta Hcg collected today. Will follow up with results.   Doreene BurkeAnnie Layla Kesling, CNM

## 2017-08-11 NOTE — Addendum Note (Signed)
Addended by: Marchelle FolksMILLER, Taneasha [name] G on: 08/11/2017 02:39 PM   Modules accepted: Orders

## 2017-08-12 LAB — BETA HCG QUANT (REF LAB): hCG Quant: 75 m[IU]/mL

## 2017-08-13 ENCOUNTER — Telehealth: Payer: Self-pay | Admitting: Certified Nurse Midwife

## 2017-08-13 ENCOUNTER — Other Ambulatory Visit: Payer: Self-pay | Admitting: Certified Nurse Midwife

## 2017-08-13 DIAGNOSIS — N912 Amenorrhea, unspecified: Secondary | ICD-10-CM

## 2017-08-13 LAB — URINE CULTURE

## 2017-08-13 NOTE — Progress Notes (Signed)
Repeat beta HCG   Doreene BurkeAnnie Chandni Gagan, CNM

## 2017-08-13 NOTE — Telephone Encounter (Signed)
PT notified of lab results. Plan to repeat Beta Hcg on Tuesday or Wednesday. Pt verbalizes understanding and agrees to plan of care.   Doreene BurkeAnnie Audrielle Vankuren, CNM

## 2017-08-17 ENCOUNTER — Telehealth: Payer: Self-pay | Admitting: Obstetrics & Gynecology

## 2017-08-17 ENCOUNTER — Telehealth: Payer: Self-pay | Admitting: Certified Nurse Midwife

## 2017-08-17 NOTE — Telephone Encounter (Signed)
The patient called and stated that she needs to be scheduled for another u/s and lab appointment for bloodwork. The patient does not have a disposition from her last appointment on 08/11/17, So I wanted to confirm what the patient needs to be scheduled for with a nurse or provider first. Please advise.

## 2017-08-21 ENCOUNTER — Other Ambulatory Visit: Payer: Self-pay | Admitting: Certified Nurse Midwife

## 2017-08-21 ENCOUNTER — Ambulatory Visit: Payer: No Typology Code available for payment source

## 2017-08-21 DIAGNOSIS — N912 Amenorrhea, unspecified: Secondary | ICD-10-CM

## 2017-08-21 DIAGNOSIS — N926 Irregular menstruation, unspecified: Secondary | ICD-10-CM

## 2017-08-21 NOTE — Telephone Encounter (Signed)
Called patient to see if she still wanted to be seen here.  Patient states she has u/s scheduled there this week.  Advised to keep that appt with them since she is already scheduled and if she still decides she wants to transfer, to just give us a call afterwards.  Verbalized understanding.

## 2017-08-22 ENCOUNTER — Telehealth: Payer: Self-pay | Admitting: Certified Nurse Midwife

## 2017-08-22 LAB — BETA HCG QUANT (REF LAB): hCG Quant: 4798 m[IU]/mL

## 2017-08-22 NOTE — Telephone Encounter (Signed)
Reviewed beta hcg results and discussed u/s for dating/viability. She has u/s scheduled for Friday.   Doreene BurkeAnnie Wisdom Seybold, CNM

## 2017-08-25 ENCOUNTER — Ambulatory Visit (INDEPENDENT_AMBULATORY_CARE_PROVIDER_SITE_OTHER): Payer: No Typology Code available for payment source

## 2017-08-25 DIAGNOSIS — Z3A01 Less than 8 weeks gestation of pregnancy: Secondary | ICD-10-CM | POA: Diagnosis not present

## 2017-08-25 DIAGNOSIS — N8311 Corpus luteum cyst of right ovary: Secondary | ICD-10-CM

## 2017-08-25 DIAGNOSIS — O3411 Maternal care for benign tumor of corpus uteri, first trimester: Secondary | ICD-10-CM | POA: Diagnosis not present

## 2017-09-15 ENCOUNTER — Other Ambulatory Visit (HOSPITAL_COMMUNITY)
Admission: RE | Admit: 2017-09-15 | Discharge: 2017-09-15 | Disposition: A | Discharge status: Home / Self Care | Payer: No Typology Code available for payment source | Source: Ambulatory Visit | Attending: Certified Nurse Midwife | Admitting: Certified Nurse Midwife

## 2017-09-15 ENCOUNTER — Ambulatory Visit: Payer: Medicaid Other | Admitting: Certified Nurse Midwife

## 2017-09-15 VITALS — BP 118/79 | HR 84 | Ht 67.0 in | Wt 216.0 lb

## 2017-09-15 DIAGNOSIS — Z3401 Encounter for supervision of normal first pregnancy, first trimester: Secondary | ICD-10-CM

## 2017-09-15 DIAGNOSIS — Z202 Contact with and (suspected) exposure to infections with a predominantly sexual mode of transmission: Secondary | ICD-10-CM | POA: Insufficient documentation

## 2017-09-15 DIAGNOSIS — R638 Other symptoms and signs concerning food and fluid intake: Secondary | ICD-10-CM

## 2017-09-15 NOTE — Progress Notes (Signed)
Jade Palmer presents for NOB nurse interview visit. Pregnancy confirmation done 08/11/2017, Dating scan 08/25/2017, EDD 04/19/2017. G3-. P2-. Pregnancy education material explained and given. _No_ cats in home. NOB labs ordered. TSH/HbgA1c ordered due to BMI 33.8. HIV labs and drug screen were explained and ordered. PNV encouraged. Genetic screening options discussed. Patient may discuss with the provider. Patient to follow up with provider in 3 weeks for NOB physical. All questions answered.

## 2017-09-15 NOTE — Patient Instructions (Signed)
WHAT OB PATIENTS CAN EXPECT   Confirmation of pregnancy and ultrasound ordered if medically indicated-[redacted] weeks gestation  New OB (NOB) intake with nurse and New OB (NOB) labs- [redacted] weeks gestation  New OB (NOB) physical examination with provider- 11/[redacted] weeks gestation  Flu vaccine-[redacted] weeks gestation  Anatomy scan-[redacted] weeks gestation  Glucose tolerance test, blood work to test for anemia, T-dap vaccine-[redacted] weeks gestation  Vaginal swabs/cultures-STD/Group B strep-[redacted] weeks gestation  Appointments every 4 weeks until 28 weeks  Every 2 weeks from 28 weeks until 36 weeks  Weekly visits from 36 weeks until delivery  Second Trimester of Pregnancy The second trimester is from week 13 through week 28, month 4 through 6. This is often the time in pregnancy that you feel your best. Often times, morning sickness has lessened or quit. You may have more energy, and you may get hungry more often. Your unborn baby (fetus) is growing rapidly. At the end of the sixth month, he or she is about 9 inches long and weighs about 1 pounds. You will likely feel the baby move (quickening) between 18 and 20 weeks of pregnancy. Follow these instructions at home:  Avoid all smoking, herbs, and alcohol. Avoid drugs not approved by your doctor.  Do not use any tobacco products, including cigarettes, chewing tobacco, and electronic cigarettes. If you need help quitting, ask your doctor. You may get counseling or other support to help you quit.  Only take medicine as told by your doctor. Some medicines are safe and some are not during pregnancy.  Exercise only as told by your doctor. Stop exercising if you start having cramps.  Eat regular, healthy meals.  Wear a good support bra if your breasts are tender.  Do not use hot tubs, steam rooms, or saunas.  Wear your seat belt when driving.  Avoid raw meat, uncooked cheese, and liter boxes and soil used by cats.  Take your prenatal vitamins.  Take 1500-2000  milligrams of calcium daily starting at the 20th week of pregnancy until you deliver your baby.  Try taking medicine that helps you poop (stool softener) as needed, and if your doctor approves. Eat more fiber by eating fresh fruit, vegetables, and whole grains. Drink enough fluids to keep your pee (urine) clear or pale yellow.  Take warm water baths (sitz baths) to soothe pain or discomfort caused by hemorrhoids. Use hemorrhoid cream if your doctor approves.  If you have puffy, bulging veins (varicose veins), wear support hose. Raise (elevate) your feet for 15 minutes, 3-4 times a day. Limit salt in your diet.  Avoid heavy lifting, wear low heals, and sit up straight.  Rest with your legs raised if you have leg cramps or low back pain.  Visit your dentist if you have not gone during your pregnancy. Use a soft toothbrush to brush your teeth. Be gentle when you floss.  You can have sex (intercourse) unless your doctor tells you not to.  Go to your doctor visits. Get help if:  You feel dizzy.  You have mild cramps or pressure in your lower belly (abdomen).  You have a nagging pain in your belly area.  You continue to feel sick to your stomach (nauseous), throw up (vomit), or have watery poop (diarrhea).  You have bad smelling fluid coming from your vagina.  You have pain with peeing (urination). Get help right away if:  You have a fever.  You are leaking fluid from your vagina.  You have spotting or bleeding  from your vagina.  You have severe belly cramping or pain.  You lose or gain weight rapidly.  You have trouble catching your breath and have chest pain.  You notice sudden or extreme puffiness (swelling) of your face, hands, ankles, feet, or legs.  You have not felt the baby move in over an hour.  You have severe headaches that do not go away with medicine.  You have vision changes. This information is not intended to replace advice given to you by your health care  provider. Make sure you discuss any questions you have with your health care provider. Document Released: 04/20/2009 Document Revised: 07/02/2015 Document Reviewed: 03/27/2012 Elsevier Interactive Patient Education  2017 [employer] Morning Sickness Morning sickness is when you feel sick to your stomach (nauseous) during pregnancy. You may feel sick to your stomach and throw up (vomit). You may feel sick in the morning, but you can feel this way any time of day. Some women feel very sick to their stomach and cannot stop throwing up (hyperemesis gravidarum). Follow these instructions at home:  Only take medicines as told by your doctor.  Take multivitamins as told by your doctor. Taking multivitamins before getting pregnant can stop or lessen the harshness of morning sickness.  Eat dry toast or unsalted crackers before getting out of bed.  Eat 5 to 6 small meals a day.  Eat dry and bland foods like rice and baked potatoes.  Do not drink liquids with meals. Drink between meals.  Do not eat greasy, fatty, or spicy foods.  Have someone cook for you if the smell of food causes you to feel sick or throw up.  If you feel sick to your stomach after taking prenatal vitamins, take them at night or with a snack.  Eat protein when you need a snack (nuts, yogurt, cheese).  Eat unsweetened gelatins for dessert.  Wear a bracelet used for sea sickness (acupressure wristband).  Go to a doctor that puts thin needles into certain body points (acupuncture) to improve how you feel.  Do not smoke.  Use a humidifier to keep the air in your house free of odors.  Get lots of fresh air. Contact a doctor if:  You need medicine to feel better.  You feel dizzy or lightheaded.  You are losing weight. Get help right away if:  You feel very sick to your stomach and cannot stop throwing up.  You pass out (faint). This information is not intended to replace advice given to you by your health care  provider. Make sure you discuss any questions you have with your health care provider. Document Released: 03/03/2004 Document Revised: 07/02/2015 Document Reviewed: 07/11/2012 Elsevier Interactive Patient Education  2017 [employer]. How a Baby Grows During Pregnancy Pregnancy begins when a female's sperm enters a female's egg (fertilization). This happens in one of the tubes (fallopian tubes) that connect the ovaries to the womb (uterus). The fertilized egg is called an embryo until it reaches 10 weeks. From 10 weeks until birth, it is called a fetus. The fertilized egg moves down the fallopian tube to the uterus. Then it implants into the lining of the uterus and begins to grow. The developing fetus receives oxygen and nutrients through the pregnant woman's bloodstream and the tissues that grow (placenta) to support the fetus. The placenta is the life support system for the fetus. It provides nutrition and removes waste. Learning as much as you can about your pregnancy and how your baby is developing  can help you enjoy the experience. It can also make you aware of when there might be a problem and when to ask questions. How long does a typical pregnancy last? A pregnancy usually lasts 280 days, or about 40 weeks. Pregnancy is divided into three trimesters:  First trimester: 0-13 weeks.  Second trimester: 14-27 weeks.  Third trimester: 28-40 weeks.  The day when your baby is considered ready to be born (full term) is your estimated date of delivery. How does my baby develop month by month? First month  The fertilized egg attaches to the inside of the uterus.  Some cells will form the placenta. Others will form the fetus.  The arms, legs, brain, spinal cord, lungs, and heart begin to develop.  At the end of the first month, the heart begins to beat.  Second month  The bones, inner ear, eyelids, hands, and feet form.  The genitals develop.  By the end of 8 weeks, all major organs are  developing.  Third month  All of the internal organs are forming.  Teeth develop below the gums.  Bones and muscles begin to grow. The spine can flex.  The skin is transparent.  Fingernails and toenails begin to form.  Arms and legs continue to grow longer, and hands and feet develop.  The fetus is about 3 in (7.6 cm) long.  Fourth month  The placenta is completely formed.  The external sex organs, neck, outer ear, eyebrows, eyelids, and fingernails are formed.  The fetus can hear, swallow, and move its arms and legs.  The kidneys begin to produce urine.  The skin is covered with a white waxy coating (vernix) and very fine hair (lanugo).  Fifth month  The fetus moves around more and can be felt for the first time (quickening).  The fetus starts to sleep and wake up and may begin to suck its finger.  The nails grow to the end of the fingers.  The organ in the digestive system that makes bile (gallbladder) functions and helps to digest the nutrients.  If your baby is a girl, eggs are present in her ovaries. If your baby is a boy, testicles start to move down into his scrotum.  Sixth month  The lungs are formed, but the fetus is not yet able to breathe.  The eyes open. The brain continues to develop.  Your baby has fingerprints and toe prints. Your baby's hair grows thicker.  At the end of the second trimester, the fetus is about 9 in (22.9 cm) long.  Seventh month  The fetus kicks and stretches.  The eyes are developed enough to sense changes in light.  The hands can make a grasping motion.  The fetus responds to sound.  Eighth month  All organs and body systems are fully developed and functioning.  Bones harden and taste buds develop. The fetus may hiccup.  Certain areas of the brain are still developing. The skull remains soft.  Ninth month  The fetus gains about  lb (0.23 kg) each week.  The lungs are fully developed.  Patterns of sleep  develop.  The fetus's head typically moves into a head-down position (vertex) in the uterus to prepare for birth. If the buttocks move into a vertex position instead, the baby is breech.  The fetus weighs 6-9 lbs (2.72-4.08 kg) and is 19-20 in (48.26-50.8 cm) long.  What can I do to have a healthy pregnancy and help my baby develop? Eating and Drinking  Eat a healthy diet. ? Talk with your health care provider to make sure that you are getting the nutrients that you and your baby need. ? Visit www.BuildDNA.es to learn about creating a healthy diet.  Gain a healthy amount of weight during pregnancy as advised by your health care provider. This is usually 25-35 pounds. You may need to: ? Gain more if you were underweight before getting pregnant or if you are pregnant with more than one baby. ? Gain less if you were overweight or obese when you got pregnant.  Medicines and Vitamins  Take prenatal vitamins as directed by your health care provider. These include vitamins such as folic acid, iron, calcium, and vitamin D. They are important for healthy development.  Take medicines only as directed by your health care provider. Read labels and ask a pharmacist or your health care provider whether over-the-counter medicines, supplements, and prescription drugs are safe to take during pregnancy.  Activities  Be physically active as advised by your health care provider. Ask your health care provider to recommend activities that are safe for you to do, such as walking or swimming.  Do not participate in strenuous or extreme sports.  Lifestyle  Do not drink alcohol.  Do not use any tobacco products, including cigarettes, chewing tobacco, or electronic cigarettes. If you need help quitting, ask your health care provider.  Do not use illegal drugs.  Safety  Avoid exposure to mercury, lead, or other heavy metals. Ask your health care provider about common sources of these heavy  metals.  Avoid listeria infection during pregnancy. Follow these precautions: ? Do not eat soft cheeses or deli meats. ? Do not eat hot dogs unless they have been warmed up to the point of steaming, such as in the microwave oven. ? Do not drink unpasteurized milk.  Avoid toxoplasmosis infection during pregnancy. Follow these precautions: ? Do not change your cat's litter box, if you have a cat. Ask someone else to do this for you. ? Wear gardening gloves while working in the yard.  General Instructions  Keep all follow-up visits as directed by your health care provider. This is important. This includes prenatal care and screening tests.  Manage any chronic health conditions. Work closely with your health care provider to keep conditions, such as diabetes, under control.  How do I know if my baby is developing well? At each prenatal visit, your health care provider will do several different tests to check on your health and keep track of your baby's development. These include:  Fundal height. ? Your health care provider will measure your growing belly from top to bottom using a tape measure. ? Your health care provider will also feel your belly to determine your baby's position.  Heartbeat. ? An ultrasound in the first trimester can confirm pregnancy and show a heartbeat, depending on how far along you are. ? Your health care provider will check your baby's heart rate at every prenatal visit. ? As you get closer to your delivery date, you may have regular fetal heart rate monitoring to make sure that your baby is not in distress.  Second trimester ultrasound. ? This ultrasound checks your baby's development. It also indicates your baby's gender.  What should I do if I have concerns about my baby's development? Always talk with your health care provider about any concerns that you may have. This information is not intended to replace advice given to you by your health care provider.  Make sure  you discuss any questions you have with your health care provider. Document Released: 07/13/2007 Document Revised: 07/02/2015 Document Reviewed: 07/03/2013 Elsevier Interactive Patient Education  2018 [employer]. First Trimester of Pregnancy The first trimester of pregnancy is from week 1 until the end of week 13 (months 1 through 3). During this time, your baby will begin to develop inside you. At 6-8 weeks, the eyes and face are formed, and the heartbeat can be seen on ultrasound. At the end of 12 weeks, all the baby's organs are formed. Prenatal care is all the medical care you receive before the birth of your baby. Make sure you get good prenatal care and follow all of your doctor's instructions. Follow these instructions at home: Medicines  Take over-the-counter and prescription medicines only as told by your doctor. Some medicines are safe and some medicines are not safe during pregnancy.  Take a prenatal vitamin that contains at least 600 micrograms (mcg) of folic acid.  If you have trouble pooping (constipation), take medicine that will make your stool soft (stool softener) if your doctor approves. Eating and drinking  Eat regular, healthy meals.  Your doctor will tell you the amount of weight gain that is right for you.  Avoid raw meat and uncooked cheese.  If you feel sick to your stomach (nauseous) or throw up (vomit): ? Eat 4 or 5 small meals a day instead of 3 large meals. ? Try eating a few soda crackers. ? Drink liquids between meals instead of during meals.  To prevent constipation: ? Eat foods that are high in fiber, like fresh fruits and vegetables, whole grains, and beans. ? Drink enough fluids to keep your pee (urine) clear or pale yellow. Activity  Exercise only as told by your doctor. Stop exercising if you have cramps or pain in your lower belly (abdomen) or low back.  Do not exercise if it is too hot, too humid, or if you are in a place of great  height (high altitude).  Try to avoid standing for long periods of time. Move your legs often if you must stand in one place for a long time.  Avoid heavy lifting.  Wear low-heeled shoes. Sit and stand up straight.  You can have sex unless your doctor tells you not to. Relieving pain and discomfort  Wear a good support bra if your breasts are sore.  Take warm water baths (sitz baths) to soothe pain or discomfort caused by hemorrhoids. Use hemorrhoid cream if your doctor says it is okay.  Rest with your legs raised if you have leg cramps or low back pain.  If you have puffy, bulging veins (varicose veins) in your legs: ? Wear support hose or compression stockings as told by your doctor. ? Raise (elevate) your feet for 15 minutes, 3-4 times a day. ? Limit salt in your food. Prenatal care  Schedule your prenatal visits by the twelfth week of pregnancy.  Write down your questions. Take them to your prenatal visits.  Keep all your prenatal visits as told by your doctor. This is important. Safety  Wear your seat belt at all times when driving.  Make a list of emergency phone numbers. The list should include numbers for family, friends, the hospital, and police and fire departments. General instructions  Ask your doctor for a referral to a local prenatal class. Begin classes no later than at the start of month 6 of your pregnancy.  Ask for help if you need counseling or if  you need help with nutrition. Your doctor can give you advice or tell you where to go for help.  Do not use hot tubs, steam rooms, or saunas.  Do not douche or use tampons or scented sanitary pads.  Do not cross your legs for long periods of time.  Avoid all herbs and alcohol. Avoid drugs that are not approved by your doctor.  Do not use any tobacco products, including cigarettes, chewing tobacco, and electronic cigarettes. If you need help quitting, ask your doctor. You may get counseling or other support  to help you quit.  Avoid cat litter boxes and soil used by cats. These carry germs that can cause birth defects in the baby and can cause a loss of your baby (miscarriage) or stillbirth.  Visit your dentist. At home, brush your teeth with a soft toothbrush. Be gentle when you floss. Contact a doctor if:  You are dizzy.  You have mild cramps or pressure in your lower belly.  You have a nagging pain in your belly area.  You continue to feel sick to your stomach, you throw up, or you have watery poop (diarrhea).  You have a bad smelling fluid coming from your vagina.  You have pain when you pee (urinate).  You have increased puffiness (swelling) in your face, hands, legs, or ankles. Get help right away if:  You have a fever.  You are leaking fluid from your vagina.  You have spotting or bleeding from your vagina.  You have very bad belly cramping or pain.  You gain or lose weight rapidly.  You throw up blood. It may look like coffee grounds.  You are around people who have [place] measles, fifth disease, or chickenpox.  You have a very bad headache.  You have shortness of breath.  You have any kind of trauma, such as from a fall or a car accident. Summary  The first trimester of pregnancy is from week 1 until the end of week 13 (months 1 through 3).  To take care of yourself and your unborn baby, you will need to eat healthy meals, take medicines only if your doctor tells you to do so, and do activities that are safe for you and your baby.  Keep all follow-up visits as told by your doctor. This is important as your doctor will have to ensure that your baby is healthy and growing well. This information is not intended to replace advice given to you by your health care provider. Make sure you discuss any questions you have with your health care provider. Document Released: 07/13/2007 Document Revised: 02/02/2016 Document Reviewed: 02/02/2016 Elsevier Interactive Patient  Education  2017 [employer] Inc. Commonly Asked Questions During Pregnancy  Cats: A parasite can be excreted in cat feces.  To avoid exposure you need to have another person empty the little box.  If you must empty the litter box you will need to wear gloves.  Wash your hands after handling your cat.  This parasite can also be found in raw or undercooked meat so this should also be avoided.  Colds, Sore Throats, Flu: Please check your medication sheet to see what you can take for symptoms.  If your symptoms are unrelieved by these medications please call the office.  Dental Work: Most any dental work Agricultural consultant recommends is permitted.  X-rays should only be taken during the first trimester if absolutely necessary.  Your abdomen should be shielded with a lead apron during all x-rays.  Please  notify your provider prior to receiving any x-rays.  Novocaine is fine; gas is not recommended.  If your dentist requires a note from us prior to dental work please call the office and we will provide one for you.  Exercise: Exercise is an important part of staying healthy during your pregnancy.  You may continue most exercises you were accustomed to prior to pregnancy.  Later in your pregnancy you will most likely notice you have difficulty with activities requiring balance like riding a bicycle.  It is important that you listen to your body and avoid activities that put you at a higher risk of falling.  Adequate rest and staying well hydrated are a must!  If you have questions about the safety of specific activities ask your provider.    Exposure to Children with illness: Try to avoid obvious exposure; report any symptoms to us when noted,  If you have chicken pos, red measles or mumps, you should be immune to these diseases.   Please do not take any vaccines while pregnant unless you have checked with your OB provider.  Fetal Movement: After 28 weeks we recommend you do "kick counts" twice daily.  Lie or sit down  in a calm quiet environment and count your baby movements "kicks".  You should feel your baby at least 10 times per hour.  If you have not felt 10 kicks within the first hour get up, walk around and have something sweet to eat or drink then repeat for an additional hour.  If count remains less than 10 per hour notify your provider.  Fumigating: Follow your pest control agent's advice as to how long to stay out of your home.  Ventilate the area well before re-entering.  Hemorrhoids:   Most over-the-counter preparations can be used during pregnancy.  Check your medication to see what is safe to use.  It is important to use a stool softener or fiber in your diet and to drink lots of liquids.  If hemorrhoids seem to be getting worse please call the office.   Hot Tubs:  Hot tubs Jacuzzis and saunas are not recommended while pregnant.  These increase your internal body temperature and should be avoided.  Intercourse:  Sexual intercourse is safe during pregnancy as long as you are comfortable, unless otherwise advised by your provider.  Spotting may occur after intercourse; report any bright red bleeding that is heavier than spotting.  Labor:  If you know that you are in labor, please go to the hospital.  If you are unsure, please call the office and let us help you decide what to do.  Lifting, straining, etc:  If your job requires heavy lifting or straining please check with your provider for any limitations.  Generally, you should not lift items heavier than that you can lift simply with your hands and arms (no back muscles)  Painting:  Paint fumes do not harm your pregnancy, but may make you ill and should be avoided if possible.  Latex or water based paints have less odor than oils.  Use adequate ventilation while painting.  Permanents & Hair Color:  Chemicals in hair dyes are not recommended as they cause increase hair dryness which can increase hair loss during pregnancy.  " Highlighting" and  permanents are allowed.  Dye may be absorbed differently and permanents may not hold as well during pregnancy.  Sunbathing:  Use a sunscreen, as skin burns easily during pregnancy.  Drink plenty of fluids; avoid over heating.  Tanning Beds:  Because their possible side effects are still unknown, tanning beds are not recommended.  Ultrasound Scans:  Routine ultrasounds are performed at approximately 20 weeks.  You will be able to see your baby's general anatomy an if you would like to know the gender this can usually be determined as well.  If it is questionable when you conceived you may also receive an ultrasound early in your pregnancy for dating purposes.  Otherwise ultrasound exams are not routinely performed unless there is a medical necessity.  Although you can request a scan we ask that you pay for it when conducted because insurance does not cover " patient request" scans.  Work: If your pregnancy proceeds without complications you may work until your due date, unless your physician or employer advises otherwise.  Round Ligament Pain/Pelvic Discomfort:  Sharp, shooting pains not associated with bleeding are fairly common, usually occurring in the second trimester of pregnancy.  They tend to be worse when standing up or when you remain standing for long periods of time.  These are the result of pressure of certain pelvic ligaments called "round ligaments".  Rest, Tylenol and heat seem to be the most effective relief.  As the womb and fetus grow, they rise out of the pelvis and the discomfort improves.  Please notify the office if your pain seems different than that described.  It may represent a more serious condition.

## 2017-09-16 LAB — DRUG PROFILE, UR, 9 DRUGS (LABCORP)
Amphetamines, Urine: NEGATIVE ng/mL
BARBITURATE QUANT UR: NEGATIVE ng/mL
BENZODIAZEPINE QUANT UR: NEGATIVE ng/mL
Cannabinoid Quant, Ur: NEGATIVE ng/mL
Cocaine (Metab.): NEGATIVE ng/mL
Methadone Screen, Urine: NEGATIVE ng/mL
OPIATE QUANT UR: NEGATIVE ng/mL
PCP QUANT UR: NEGATIVE ng/mL
PROPOXYPHENE: NEGATIVE ng/mL

## 2017-09-16 LAB — NICOTINE SCREEN, URINE: Cotinine Ql Scrn, Ur: NEGATIVE ng/mL

## 2017-09-19 LAB — URINALYSIS, ROUTINE W REFLEX MICROSCOPIC
BILIRUBIN UA: NEGATIVE
Glucose, UA: NEGATIVE
NITRITE UA: NEGATIVE
Protein, UA: NEGATIVE
RBC UA: NEGATIVE
SPEC GRAV UA: 1.025 (ref 1.005–1.030)
UUROB: 0.2 mg/dL (ref 0.2–1.0)
pH, UA: 5.5 (ref 5.0–7.5)

## 2017-09-19 LAB — CBC WITH DIFFERENTIAL/PLATELET
Basophils Absolute: 0 10*3/uL (ref 0.0–0.2)
Basos: 0 %
EOS (ABSOLUTE): 0.1 10*3/uL (ref 0.0–0.4)
Eos: 1 %
Hematocrit: 32.2 % — ABNORMAL LOW (ref 34.0–46.6)
Hemoglobin: 10.7 g/dL — ABNORMAL LOW (ref 11.1–15.9)
Immature Grans (Abs): 0 10*3/uL (ref 0.0–0.1)
Immature Granulocytes: 0 %
Lymphocytes Absolute: 1.5 10*3/uL (ref 0.7–3.1)
Lymphs: 19 %
MCH: 27.3 pg (ref 26.6–33.0)
MCHC: 33.2 g/dL (ref 31.5–35.7)
MCV: 82 fL (ref 79–97)
MONOS ABS: 0.4 10*3/uL (ref 0.1–0.9)
Monocytes: 5 %
Neutrophils Absolute: 6.2 10*3/uL (ref 1.4–7.0)
Neutrophils: 75 %
Platelets: 214 10*3/uL (ref 150–450)
RBC: 3.92 x10E6/uL (ref 3.77–5.28)
RDW: 16.2 % — AB (ref 12.3–15.4)
WBC: 8.2 10*3/uL (ref 3.4–10.8)

## 2017-09-19 LAB — MICROSCOPIC EXAMINATION: Casts: NONE SEEN /lpf

## 2017-09-19 LAB — TSH: TSH: 0.239 u[IU]/mL — ABNORMAL LOW (ref 0.450–4.500)

## 2017-09-19 LAB — RPR: RPR Ser Ql: NONREACTIVE

## 2017-09-19 LAB — VARICELLA ZOSTER ANTIBODY, IGG: Varicella zoster IgG: 172 index (ref >165)

## 2017-09-19 LAB — ABO AND RH: Rh Factor: NEGATIVE

## 2017-09-19 LAB — HEMOGLOBIN A1C
ESTIMATED AVERAGE GLUCOSE: 94 mg/dL
HEMOGLOBIN A1C: 4.9 % (ref 4.8–5.6)

## 2017-09-19 LAB — PARVOVIRUS B19 ANTIBODY, IGG AND IGM
PARVOVIRUS B19 IGG: 4.4 {index} — AB (ref 0.0–0.8)
Parvovirus B19 IgM: 0.7 index (ref 0.0–0.8)

## 2017-09-19 LAB — GC/CHLAMYDIA PROBE AMP (~~LOC~~) NOT AT ARMC
Chlamydia: NEGATIVE
Neisseria Gonorrhea: NEGATIVE

## 2017-09-19 LAB — HEPATITIS B SURFACE ANTIGEN: Hepatitis B Surface Ag: NEGATIVE

## 2017-09-19 LAB — ANTIBODY SCREEN: ANTIBODY SCREEN: NEGATIVE

## 2017-09-19 LAB — RUBELLA SCREEN: Rubella Antibodies, IGG: 6.5 index (ref >0.99)

## 2017-09-19 LAB — HIV ANTIBODY (ROUTINE TESTING W REFLEX): HIV Screen 4th Generation wRfx: NONREACTIVE

## 2017-09-20 LAB — CULTURE, OB URINE

## 2017-09-20 LAB — URINE CULTURE, OB REFLEX

## 2017-10-06 ENCOUNTER — Encounter: Payer: Self-pay | Admitting: Certified Nurse Midwife

## 2017-10-06 ENCOUNTER — Ambulatory Visit (INDEPENDENT_AMBULATORY_CARE_PROVIDER_SITE_OTHER): Payer: No Typology Code available for payment source | Admitting: Certified Nurse Midwife

## 2017-10-06 VITALS — BP 108/68 | HR 94 | Wt 215.0 lb

## 2017-10-06 DIAGNOSIS — Z3481 Encounter for supervision of other normal pregnancy, first trimester: Secondary | ICD-10-CM

## 2017-10-06 DIAGNOSIS — Z3A12 12 weeks gestation of pregnancy: Secondary | ICD-10-CM

## 2017-10-06 DIAGNOSIS — Z3401 Encounter for supervision of normal first pregnancy, first trimester: Secondary | ICD-10-CM

## 2017-10-06 LAB — POCT URINALYSIS DIPSTICK OB
Bilirubin, UA: NEGATIVE
GLUCOSE, UA: NEGATIVE
KETONES UA: NEGATIVE
Leukocytes, UA: NEGATIVE
Nitrite, UA: NEGATIVE
POC,PROTEIN,UA: NEGATIVE
RBC UA: NEGATIVE
SPEC GRAV UA: 1.025 (ref 1.010–1.025)
Urobilinogen, UA: 0.2 E.U./dL
pH, UA: 5 (ref 5.0–8.0)

## 2017-10-06 NOTE — Progress Notes (Signed)
NEW OB HISTORY AND PHYSICAL  SUBJECTIVE:       Jade Palmer is a 27 y.o. [phone] female, No LMP recorded (lmp unknown). Patient is pregnant., Estimated Date of Delivery: 04/20/18, [redacted]w[redacted]d, presents today for establishment of Prenatal Care.  She has no unusual complaints. Pregnancy complicated by close interval pregnancy .     Gynecologic History No LMP recorded (lmp unknown). Patient is pregnant. Unknown Contraception: none Last Pap: 04/20/16 Results were: normal  Obstetric History OB History  Gravida Para Term Preterm AB Living  3 2 1 1   2   SAB TAB Ectopic Multiple Live Births        0 2    # Outcome Date GA Lbr Len/2nd Weight Sex Delivery Anes PTL Lv  3 Current           2 Term 05/05/17 [redacted]w[redacted]d  2.8 kg F Vag-Spont EPI  LIV  1 Preterm 05/04/12 [redacted]w[redacted]d / 03:39 2.722 kg F Vag-Spont EPI  LIV    Past Medical History:  Diagnosis Date  . Calculus of kidney affecting pregnancy 04/11/2017   Kidney stone  . Contraceptive education 04/12/2013  . History of kidney stones   . Seizures (HCC)    had seizure at age 32. Unknown etiology- no meds for this in about 5 years    Past Surgical History:  Procedure Laterality Date  . BALLOON DILATION Right 04/25/2013   Procedure: RIGHT URETER BALLOON DILATION;  Surgeon: Ky Barban, MD;  Location: [facility];  Service: Urology;  Laterality: Right;  . CYSTOSCOPY WITH STENT PLACEMENT Left 04/12/2017   Procedure: CYSTOSCOPY;  Surgeon: Riki Altes, MD;  Location: [facility];  Service: Urology;  Laterality: Left;  . CYSTOSCOPY/RETROGRADE/URETEROSCOPY/STONE EXTRACTION WITH BASKET Right 04/25/2013   Procedure: CYSTOSCOPY/RIGHT RETROGRADE/RIGHT URETEROSCOPY/BASKET;  Surgeon: Ky Barban, MD;  Location: [facility];  Service: Urology;  Laterality: Right;  . TONSILLECTOMY    . URETEROSCOPY WITH HOLMIUM LASER LITHOTRIPSY Left 04/12/2017   Procedure: URETEROSCOPY;  Surgeon: Riki Altes, MD;  Location: [facility];  Service: Urology;  Laterality: Left;     Current Outpatient Medications on File Prior to Visit  Medication Sig Dispense Refill  . Prenatal Vit-Fe Fumarate-FA (PRENATAL MULTIVITAMIN) TABS tablet Take 1 tablet by mouth daily at 12 noon.     No current facility-administered medications on file prior to visit.     No Known Allergies  Social History   Socioeconomic History  . Marital status: Legally Separated    Spouse name: Not on file  . Number of children: Not on file  . Years of education: Not on file  . Highest education level: Not on file  Occupational History  . Not on file  Social Needs  . Financial resource strain: Not on file  . Food insecurity:    Worry: Not on file    Inability: Not on file  . Transportation needs:    Medical: Not on file    Non-medical: Not on file  Tobacco Use  . Smoking status: Never Smoker  . Smokeless tobacco: Never Used  Substance and Sexual Activity  . Alcohol use: No    Comment: rarely  . Drug use: No  . Sexual activity: Yes    Birth control/protection: Other-see comments    Comment: Still Deciding  Lifestyle  . Physical activity:    Days per week: Not on file    Minutes per session: Not on file  . Stress: Not on file  Relationships  . Social connections:  Talks on phone: Not on file    Gets together: Not on file    Attends religious service: Not on file    Active member of club or organization: Not on file    Attends meetings of clubs or organizations: Not on file    Relationship status: Not on file  . Intimate partner violence:    Fear of current or ex partner: Not on file    Emotionally abused: Not on file    Physically abused: Not on file    Forced sexual activity: Not on file  Other Topics Concern  . Not on file  Social History Narrative  . Not on file    Family History  Problem Relation Age of Onset  . Hyperlipidemia Mother   . Hypertension Father   . Cancer Maternal Grandmother        lung  . Cancer Maternal Grandfather        bone  .  Diabetes Paternal Grandfather   . Hypertension Paternal Grandmother     The following portions of the patient's history were reviewed and updated as appropriate: allergies, current medications, past OB history, past medical history, past surgical history, past family history, past social history, and problem list.    OBJECTIVE: Initial Physical Exam (New OB) Body mass index is 33.67 kg/m. GENERAL APPEARANCE: alert, well appearing, in no apparent distress, oriented to person, place and time, overweight HEAD: normocephalic, atraumatic MOUTH: mucous membranes moist, pharynx normal without lesions THYROID: no thyromegaly or masses present BREASTS: no masses noted, no significant tenderness, no palpable axillary nodes, no skin changes LUNGS: clear to auscultation, no wheezes, rales or rhonchi, symmetric air entry HEART: regular rate and rhythm, no murmurs ABDOMEN: soft, nontender, nondistended, no abnormal masses, no epigastric pain EXTREMITIES: no redness or tenderness in the calves or thighs SKIN: normal coloration and turgor, no rashes LYMPH NODES: no adenopathy palpable NEUROLOGIC: alert, oriented, normal speech, no focal findings or movement disorder noted  PELVIC EXAM Deferred,pt had postpartum pelvic exam on 07/14/17  ASSESSMENT: Normal pregnancy  PLAN: New OB counseling: The patient has been given an overview regarding routine prenatal care. Recommendations regarding diet, weight gain, and exercise in pregnancy were given. Prenatal testing, optional genetic testing, and ultrasound use in pregnancy were reviewed. Maternit done today. Benefits of Breast Feeding were discussed. The patient is encouraged to consider nursing her baby post partum.  Doreene BurkeAnnie Eytan Carrigan, CNM

## 2017-10-06 NOTE — Patient Instructions (Signed)

## 2017-10-12 LAB — MATERNIT 21 PLUS CORE, BLOOD
CHROMOSOME 18: NEGATIVE
Chromosome 13: NEGATIVE
Chromosome 21: NEGATIVE
Y Chromosome: DETECTED

## 2017-10-16 ENCOUNTER — Telehealth: Payer: Self-pay | Admitting: Certified Nurse Midwife

## 2017-10-16 NOTE — Telephone Encounter (Signed)
The patient called and stated that she would like to have a call back from the nurse to discus her genetic screening results. Please advise.

## 2017-10-17 ENCOUNTER — Telehealth: Payer: Self-pay

## 2017-10-17 NOTE — Telephone Encounter (Signed)
Pt requested gender- female also requested a call be made to mother in law [phone] Toniann Fail and inform her. Call was placed and MIL informed.

## 2017-10-17 NOTE — Telephone Encounter (Signed)
Attempted to return pts call- message left for pt to send me a mychart message with her questions. Mychart message also.

## 2017-11-03 ENCOUNTER — Encounter: Payer: Self-pay | Admitting: Certified Nurse Midwife

## 2017-11-03 ENCOUNTER — Encounter: Payer: No Typology Code available for payment source | Admitting: Certified Nurse Midwife

## 2017-11-03 ENCOUNTER — Ambulatory Visit (INDEPENDENT_AMBULATORY_CARE_PROVIDER_SITE_OTHER): Payer: No Typology Code available for payment source | Admitting: Certified Nurse Midwife

## 2017-11-03 VITALS — BP 118/49 | HR 88 | Wt 213.6 lb

## 2017-11-03 DIAGNOSIS — Z3401 Encounter for supervision of normal first pregnancy, first trimester: Secondary | ICD-10-CM

## 2017-11-03 DIAGNOSIS — Z23 Encounter for immunization: Secondary | ICD-10-CM

## 2017-11-03 DIAGNOSIS — R109 Unspecified abdominal pain: Secondary | ICD-10-CM

## 2017-11-03 DIAGNOSIS — Z3A16 16 weeks gestation of pregnancy: Secondary | ICD-10-CM | POA: Diagnosis not present

## 2017-11-03 DIAGNOSIS — O9989 Other specified diseases and conditions complicating pregnancy, childbirth and the puerperium: Secondary | ICD-10-CM

## 2017-11-03 LAB — POCT URINALYSIS DIPSTICK OB
BILIRUBIN UA: NEGATIVE
Glucose, UA: NEGATIVE
Ketones, UA: NEGATIVE
Leukocytes, UA: NEGATIVE
Nitrite, UA: NEGATIVE
POC,PROTEIN,UA: NEGATIVE
RBC UA: NEGATIVE
SPEC GRAV UA: 1.025 (ref 1.010–1.025)
Urobilinogen, UA: 0.2 E.U./dL
pH, UA: 5 (ref 5.0–8.0)

## 2017-11-03 NOTE — Patient Instructions (Signed)

## 2017-11-03 NOTE — Progress Notes (Signed)
ROB doing well. Discussed round ligament pain. Encouraged Belly band. Discussed anatomy u/s at next visit. She verbalizes and agrees to plan. Follow up 4 wks. With Melody.   Doreene Burke, CNM

## 2017-11-16 ENCOUNTER — Other Ambulatory Visit: Payer: Self-pay | Admitting: Certified Nurse Midwife

## 2017-11-16 DIAGNOSIS — Z3689 Encounter for other specified antenatal screening: Secondary | ICD-10-CM

## 2017-11-30 ENCOUNTER — Ambulatory Visit (INDEPENDENT_AMBULATORY_CARE_PROVIDER_SITE_OTHER): Payer: No Typology Code available for payment source | Admitting: Obstetrics and Gynecology

## 2017-11-30 ENCOUNTER — Ambulatory Visit (INDEPENDENT_AMBULATORY_CARE_PROVIDER_SITE_OTHER): Payer: No Typology Code available for payment source

## 2017-11-30 VITALS — BP 118/72 | HR 79 | Wt 217.2 lb

## 2017-11-30 DIAGNOSIS — Z3482 Encounter for supervision of other normal pregnancy, second trimester: Secondary | ICD-10-CM

## 2017-11-30 DIAGNOSIS — Z3492 Encounter for supervision of normal pregnancy, unspecified, second trimester: Secondary | ICD-10-CM | POA: Diagnosis not present

## 2017-11-30 DIAGNOSIS — Z3689 Encounter for other specified antenatal screening: Secondary | ICD-10-CM

## 2017-11-30 DIAGNOSIS — Z3A20 20 weeks gestation of pregnancy: Secondary | ICD-10-CM | POA: Diagnosis not present

## 2017-11-30 LAB — POCT URINALYSIS DIPSTICK OB
Bilirubin, UA: NEGATIVE
GLUCOSE, UA: NEGATIVE
Ketones, UA: NEGATIVE
Leukocytes, UA: NEGATIVE
NITRITE UA: NEGATIVE
PROTEIN: NEGATIVE
RBC UA: NEGATIVE
Spec Grav, UA: 1.015 (ref 1.010–1.025)
Urobilinogen, UA: 0.2 E.U./dL
pH, UA: 6.5 (ref 5.0–8.0)

## 2017-11-30 NOTE — Progress Notes (Signed)
ROB- anatomy scan done today, pt is doing well 

## 2017-11-30 NOTE — Progress Notes (Signed)
ROB and anatomy scan- reviewed anatomy scan below. Patient reports 3-5 'lesions/sores" on abdomen and upper right thigh that are now going away. States they had a pustulant center and red ring around them and she popped them last week. 23 month old daughter had similar lesion on inner thigh last months. 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  Swab given to self obtain at home if another lesion appears.      Indications: Anatomy Findings:  Singleton intrauterine pregnancy is visualized with FHR at 135 BPM. Biometrics give an (U/S) Gestational age of 62 2/7 weeks and an (U/S) EDD of 04/17/18; this correlates with the clinically established EDD of 04/20/18.  Fetal presentation is vertex.  EFW: 335 grams (0lb 12oz).  62nd percentile. Placenta: Posterior and grade 1. AFI: WNL subjectively.  Anatomic survey is complete and appears WNL; Gender - Female.   Right Ovary measures 3.2 x 2.6 x 2.3 cm. It is normal in appearance. Left Ovary measures 3.2 x 2.6 x 1.9 cm. It is normal appearance. There is no obvious evidence of a corpus luteal cyst. Survey of the adnexa demonstrates no adnexal masses. There is no free peritoneal fluid in the cul de sac.  Impression: 1. 20 2/7 week Viable Singleton Intrauterine pregnancy by U/S. 2. (U/S) EDD is consistent with Clinically established (LMP) EDD of 04/20/18. 3. Normal Anatomy Scan

## 2017-12-29 ENCOUNTER — Telehealth: Payer: Self-pay | Admitting: Obstetrics & Gynecology

## 2017-12-29 NOTE — Telephone Encounter (Signed)
Patient called, stated that she's [redacted] weeks pregnant, use to come here, moved away and not is back in the area.  [place] has been seen in [place] and she stated that we can see her records because the facility she's been receiving care at is part of [facility]. She would like to transfer care here because it's more convenient.  [phone]

## 2018-01-01 ENCOUNTER — Encounter: Payer: No Typology Code available for payment source | Admitting: Certified Nurse Midwife

## 2018-01-24 ENCOUNTER — Ambulatory Visit: Payer: Medicaid Other | Admitting: *Deleted

## 2018-01-24 ENCOUNTER — Ambulatory Visit (INDEPENDENT_AMBULATORY_CARE_PROVIDER_SITE_OTHER): Payer: Medicaid Other | Admitting: Advanced Practice Midwife

## 2018-01-24 ENCOUNTER — Encounter: Payer: Self-pay | Admitting: Advanced Practice Midwife

## 2018-01-24 ENCOUNTER — Other Ambulatory Visit: Payer: Medicaid Other

## 2018-01-24 VITALS — BP 127/75 | HR 82 | Wt 236.0 lb

## 2018-01-24 DIAGNOSIS — Z3A27 27 weeks gestation of pregnancy: Secondary | ICD-10-CM

## 2018-01-24 DIAGNOSIS — O09899 Supervision of other high risk pregnancies, unspecified trimester: Secondary | ICD-10-CM | POA: Insufficient documentation

## 2018-01-24 DIAGNOSIS — Z3482 Encounter for supervision of other normal pregnancy, second trimester: Secondary | ICD-10-CM

## 2018-01-24 DIAGNOSIS — O09292 Supervision of pregnancy with other poor reproductive or obstetric history, second trimester: Secondary | ICD-10-CM

## 2018-01-24 DIAGNOSIS — Z3492 Encounter for supervision of normal pregnancy, unspecified, second trimester: Secondary | ICD-10-CM

## 2018-01-24 DIAGNOSIS — Z331 Pregnant state, incidental: Secondary | ICD-10-CM

## 2018-01-24 DIAGNOSIS — O09892 Supervision of other high risk pregnancies, second trimester: Secondary | ICD-10-CM

## 2018-01-24 DIAGNOSIS — O0990 Supervision of high risk pregnancy, unspecified, unspecified trimester: Secondary | ICD-10-CM | POA: Insufficient documentation

## 2018-01-24 DIAGNOSIS — O09299 Supervision of pregnancy with other poor reproductive or obstetric history, unspecified trimester: Secondary | ICD-10-CM

## 2018-01-24 DIAGNOSIS — Z1389 Encounter for screening for other disorder: Secondary | ICD-10-CM

## 2018-01-24 DIAGNOSIS — R569 Unspecified convulsions: Secondary | ICD-10-CM | POA: Insufficient documentation

## 2018-01-24 LAB — POCT URINALYSIS DIPSTICK OB
Blood, UA: NEGATIVE
Glucose, UA: NEGATIVE
KETONES UA: NEGATIVE
Leukocytes, UA: NEGATIVE
Nitrite, UA: NEGATIVE
POC,PROTEIN,UA: NEGATIVE

## 2018-01-24 MED ORDER — BUTALBITAL-APAP-CAFFEINE 50-325-40 MG PO TABS: 1.0000 | ORAL_TABLET | Freq: Four times a day (QID) | ORAL | 0 refills | Status: DC | PRN

## 2018-01-24 MED ORDER — ASPIRIN EC 81 MG PO TBEC: 162.0000 mg | DELAYED_RELEASE_TABLET | Freq: Every day | ORAL | 6 refills | Status: DC

## 2018-01-24 NOTE — Progress Notes (Signed)
Transfer from [place], moved back here.  Pg care normal.   [id number] Estimated Date of Delivery: 04/20/18  Blood pressure 127/75, pulse 82, weight 236 lb (107 kg), not currently breastfeeding.   BP weight and urine results all reviewed and noted.  Please refer to the obstetrical flow sheet for the fundal height and fetal heart rate documentation:  Patient reports good fetal movement, denies any bleeding and no rupture of membranes symptoms or regular contractions  C/O HAs and swelling.   Worried she is getting preeclampsia.  Baseline labs not drawn early in pg, nor was ASA started.  Will take care of both of those things now. Pt may check BP at home periodically.   All questions were answered.   Physical Assessment:   Vitals:   01/24/18 1034  BP: 127/75  Pulse: 82  Weight: 236 lb (107 kg)  Body mass index is 36.96 kg/m.        Physical Examination:   General appearance: Well appearing, and in no distress  Mental status: Alert, oriented to person, place, and time  Skin: Warm & dry  Cardiovascular: Normal heart rate noted  Respiratory: Normal respiratory effort, no distress  Abdomen: Soft, gravid, nontender  Pelvic: Cervical exam deferred         Extremities: Edema: Trace  Fetal Status: Fetal Heart Rate (bpm): 150 Fundal Height: 28 cm Movement: Present    Results for orders placed or performed in visit on 01/24/18 (from the past 24 hour(s))  POC Urinalysis Dipstick OB   Collection Time: 01/24/18 10:53 AM  Result Value Ref Range   Color, UA     Clarity, UA     Glucose, UA Negative Negative   Bilirubin, UA     Ketones, UA neg    Spec Grav, UA     Blood, UA neg    pH, UA     POC,PROTEIN,UA Negative Negative, Trace, Small (1+), Moderate (2+), Large (3+), 4+   Urobilinogen, UA     Nitrite, UA neg    Leukocytes, UA Negative Negative   Appearance     Odor       Orders Placed This Encounter  Procedures  . Cystic Fibrosis Mutation 97  . Comprehensive metabolic  panel  . Protein / creatinine ratio, urine  . POC Urinalysis Dipstick OB    Plan:  Continued routine obstetrical care, PN2 today, Rx firocet for PRN HA Return in about 3 weeks (around 02/14/2018) for LROB.

## 2018-01-24 NOTE — Patient Instructions (Signed)
Marina GoodellAshley Michelle Brewton, I greatly value your feedback.  If you receive a survey following your visit with us today, we appreciate you taking the time to fill it out.  Thanks, Cathie BeamsFran Cresenzo-Dishmon, CNM   Call the office [phone]) or go to [facility] if:  You begin to have strong, frequent contractions  Your water breaks.  Sometimes it is a big gush of fluid, sometimes it is just a trickle that keeps getting your panties wet or running down your legs  You have vaginal bleeding.  It is normal to have a small amount of spotting if your cervix was checked.   You don't feel your baby moving like normal.  If you don't, get you something to eat and drink and lay down and focus on feeling your baby move.  You should feel at least 10 movements in 2 hours.  If you don't, you should call the office or go to [facility].    Tdap Vaccine  It is recommended that you get the Tdap vaccine during the third trimester of EACH pregnancy to help protect your baby from getting pertussis (whooping cough)  27-36 weeks is the BEST time to do this so that you can pass the protection on to your baby. During pregnancy is better than after pregnancy, but if you are unable to get it during pregnancy it will be offered at the hospital.   You will be offered this vaccine in the office after 27 weeks. If you do not have health insurance, you can get this vaccine at the health department or your family doctor  Everyone who will be around your baby should also be up-to-date on their vaccines. Adults (who are not pregnant) only need 1 dose of Tdap during adulthood.   Third Trimester of Pregnancy The third trimester is from week 29 through week 42, months 7 through 9. The third trimester is a time when the fetus is growing rapidly. At the end of the ninth month, the fetus is about 20 inches in length and weighs 6-10 pounds.  BODY CHANGES Your body goes through many changes during pregnancy. The changes vary from  woman to woman.   Your weight will continue to increase. You can expect to gain 25-35 pounds (11-16 kg) by the end of the pregnancy.  You may begin to get stretch marks on your hips, abdomen, and breasts.  You may urinate more often because the fetus is moving lower into your pelvis and pressing on your bladder.  You may develop or continue to have heartburn as a result of your pregnancy.  You may develop constipation because certain hormones are causing the muscles that push waste through your intestines to slow down.  You may develop hemorrhoids or swollen, bulging veins (varicose veins).  You may have pelvic pain because of the weight gain and pregnancy hormones relaxing your joints between the bones in your pelvis. Backaches may result from overexertion of the muscles supporting your posture.  You may have changes in your hair. These can include thickening of your hair, rapid growth, and changes in texture. Some women also have hair loss during or after pregnancy, or hair that feels dry or thin. Your hair will most likely return to normal after your baby is born.  Your breasts will continue to grow and be tender. A yellow discharge may leak from your breasts called colostrum.  Your belly button may stick out.  You may feel short of breath because of your expanding uterus.  You may notice the fetus "dropping," or moving lower in your abdomen.  You may have a bloody mucus discharge. This usually occurs a few days to a week before labor begins.  Your cervix becomes thin and soft (effaced) near your due date. WHAT TO EXPECT AT YOUR PRENATAL EXAMS  You will have prenatal exams every 2 weeks until week 36. Then, you will have weekly prenatal exams. During a routine prenatal visit:  You will be weighed to make sure you and the fetus are growing normally.  Your blood pressure is taken.  Your abdomen will be measured to track your baby's growth.  The fetal heartbeat will be listened  to.  Any test results from the previous visit will be discussed.  You may have a cervical check near your due date to see if you have effaced. At around 36 weeks, your caregiver will check your cervix. At the same time, your caregiver will also perform a test on the secretions of the vaginal tissue. This test is to determine if a type of bacteria, Group B streptococcus, is present. Your caregiver will explain this further. Your caregiver may ask you:  What your birth plan is.  How you are feeling.  If you are feeling the baby move.  If you have had any abnormal symptoms, such as leaking fluid, bleeding, severe headaches, or abdominal cramping.  If you have any questions. Other tests or screenings that may be performed during your third trimester include:  Blood tests that check for low iron levels (anemia).  Fetal testing to check the health, activity level, and growth of the fetus. Testing is done if you have certain medical conditions or if there are problems during the pregnancy. FALSE LABOR You may feel small, irregular contractions that eventually go away. These are called Braxton Hicks contractions, or false labor. Contractions may last for hours, days, or even weeks before true labor sets in. If contractions come at regular intervals, intensify, or become painful, it is best to be seen by your caregiver.  SIGNS OF LABOR   Menstrual-like cramps.  Contractions that are 5 minutes apart or less.  Contractions that start on the top of the uterus and spread down to the lower abdomen and back.  A sense of increased pelvic pressure or back pain.  A watery or bloody mucus discharge that comes from the vagina. If you have any of these signs before the 37th week of pregnancy, call your caregiver right away. You need to go to the hospital to get checked immediately. HOME CARE INSTRUCTIONS   Avoid all smoking, herbs, alcohol, and unprescribed drugs. These chemicals affect the  formation and growth of the baby.  Follow your caregiver's instructions regarding medicine use. There are medicines that are either safe or unsafe to take during pregnancy.  Exercise only as directed by your caregiver. Experiencing uterine cramps is a good sign to stop exercising.  Continue to eat regular, healthy meals.  Wear a good support bra for breast tenderness.  Do not use hot tubs, steam rooms, or saunas.  Wear your seat belt at all times when driving.  Avoid raw meat, uncooked cheese, cat litter boxes, and soil used by cats. These carry germs that can cause birth defects in the baby.  Take your prenatal vitamins.  Try taking a stool softener (if your caregiver approves) if you develop constipation. Eat more high-fiber foods, such as fresh vegetables or fruit and whole grains. Drink plenty of fluids to keep your urine  clear or pale yellow.  Take warm sitz baths to soothe any pain or discomfort caused by hemorrhoids. Use hemorrhoid cream if your caregiver approves.  If you develop varicose veins, wear support hose. Elevate your feet for 15 minutes, 3-4 times a day. Limit salt in your diet.  Avoid heavy lifting, wear low heal shoes, and practice good posture.  Rest a lot with your legs elevated if you have leg cramps or low back pain.  Visit your dentist if you have not gone during your pregnancy. Use a soft toothbrush to brush your teeth and be gentle when you floss.  A sexual relationship may be continued unless your caregiver directs you otherwise.  Do not travel far distances unless it is absolutely necessary and only with the approval of your caregiver.  Take prenatal classes to understand, practice, and ask questions about the labor and delivery.  Make a trial run to the hospital.  Pack your hospital bag.  Prepare the baby's nursery.  Continue to go to all your prenatal visits as directed by your caregiver. SEEK MEDICAL CARE IF:  You are unsure if you are in  labor or if your water has broken.  You have dizziness.  You have mild pelvic cramps, pelvic pressure, or nagging pain in your abdominal area.  You have persistent nausea, vomiting, or diarrhea.  You have a bad smelling vaginal discharge.  You have pain with urination. SEEK IMMEDIATE MEDICAL CARE IF:   You have a fever.  You are leaking fluid from your vagina.  You have spotting or bleeding from your vagina.  You have severe abdominal cramping or pain.  You have rapid weight loss or gain.  You have shortness of breath with chest pain.  You notice sudden or extreme swelling of your face, hands, ankles, feet, or legs.  You have not felt your baby move in over an hour.  You have severe headaches that do not go away with medicine.  You have vision changes. Document Released: 01/18/2001 Document Revised: 01/29/2013 Document Reviewed: 03/27/2012 [facility] Patient Information 2015 [place], [place]. This information is not intended to replace advice given to you by your health care provider. Make sure you discuss any questions you have with your health care provider.

## 2018-01-25 LAB — CBC
Hematocrit: 27.5 % — ABNORMAL LOW (ref 34.0–46.6)
Hemoglobin: 8.9 g/dL — ABNORMAL LOW (ref 11.1–15.9)
MCH: 29.3 pg (ref 26.6–33.0)
MCHC: 32.4 g/dL (ref 31.5–35.7)
MCV: 91 fL (ref 79–97)
PLATELETS: 172 10*3/uL (ref 150–450)
RBC: 3.04 x10E6/uL — AB (ref 3.77–5.28)
RDW: 14.4 % (ref 12.3–15.4)
WBC: 9.7 10*3/uL (ref 3.4–10.8)

## 2018-01-25 LAB — COMPREHENSIVE METABOLIC PANEL
ALT: 6 IU/L (ref 0–32)
AST: 10 IU/L (ref 0–40)
Albumin/Globulin Ratio: 1.5 (ref 1.2–2.2)
Albumin: 3.6 g/dL (ref 3.5–5.5)
Alkaline Phosphatase: 62 IU/L (ref 39–117)
BILIRUBIN TOTAL: 0.2 mg/dL (ref 0.0–1.2)
BUN/Creatinine Ratio: 9 (ref 9–23)
BUN: 4 mg/dL — ABNORMAL LOW (ref 6–20)
CO2: 19 mmol/L — ABNORMAL LOW (ref 20–29)
Calcium: 8.5 mg/dL — ABNORMAL LOW (ref 8.7–10.2)
Chloride: 103 mmol/L (ref 96–106)
Creatinine, Ser: 0.43 mg/dL — ABNORMAL LOW (ref 0.57–1.00)
GFR calc Af Amer: 160 mL/min/{1.73_m2} (ref >59)
GFR calc non Af Amer: 139 mL/min/{1.73_m2} (ref >59)
GLOBULIN, TOTAL: 2.4 g/dL (ref 1.5–4.5)
Glucose: 110 mg/dL — ABNORMAL HIGH (ref 65–99)
Potassium: 4.1 mmol/L (ref 3.5–5.2)
SODIUM: 139 mmol/L (ref 134–144)
TOTAL PROTEIN: 6 g/dL (ref 6.0–8.5)

## 2018-01-25 LAB — GLUCOSE TOLERANCE, 2 HOURS W/ 1HR
GLUCOSE, 2 HOUR: 106 mg/dL (ref 65–152)
GLUCOSE, FASTING: 82 mg/dL (ref 65–91)
Glucose, 1 hour: 104 mg/dL (ref 65–179)

## 2018-01-25 LAB — ANTIBODY SCREEN: ANTIBODY SCREEN: NEGATIVE

## 2018-01-25 LAB — PROTEIN / CREATININE RATIO, URINE
Creatinine, Urine: 67.1 mg/dL
Protein, Ur: 9.7 mg/dL
Protein/Creat Ratio: 145 mg/g creat (ref 0–200)

## 2018-01-25 LAB — RPR: RPR Ser Ql: NONREACTIVE

## 2018-01-25 LAB — HIV ANTIBODY (ROUTINE TESTING W REFLEX): HIV Screen 4th Generation wRfx: NONREACTIVE

## 2018-01-30 LAB — CYSTIC FIBROSIS MUTATION 97: Interpretation: NOT DETECTED

## 2018-02-07 NOTE — L&D Delivery Note (Signed)
OB/GYN Faculty Practice Delivery Note  Jade Palmer is a 28 y.o. [id number] s/p SVD at [redacted]w[redacted]d. She was admitted for IOL for severe preeclampsia by headaches.   ROM: 1h 27m with clear fluid GBS Status: unknown, received PCN Maximum Maternal Temperature: Temp (48hrs), Avg:98.2 F (36.8 C), Min:98 F (36.7 C), Max:98.4 F (36.9 C)  Labor Progress:  Admitted overnight with elevated BP, headaches and blurry vision  Started on Mg++  Induction started with FB and cytotec  Transitioned to pitocin  AROM  Progressed to complete  Delivery Date/Time: 03/20/18 at 1303 Delivery: Called to room and patient was complete and pushing. Head delivered ROA. No nuchal cord present. Shoulder and body delivered in usual fashion. Infant with spontaneous cry, placed on mother's abdomen, dried and stimulated. Cord clamped x 2 after 1-minute delay, and cut by father of baby. Cord blood drawn. Placenta delivered spontaneously with gentle cord traction. Fundus firm with massage and Pitocin. Labia, perineum, vagina, and cervix inspected inspected with 1st degree laceration.   Placenta: spontaneous, intact, 3-vessel cord Complications: NICU team present at delivery for preterm - routine resuscitation, able to be skin to skin with mother Lacerations: 1st degree repaired with 3-0 Rapide EBL: 270cc Analgesia: epidural  Postpartum Planning [x]  message to sent to schedule follow-up  [x]  vaccines UTD  Infant: Vigorous female infant  APGARs 9, 9  weight pending  Jade Gibeault S. Earlene Plater, DO OB/GYN Fellow, Faculty Practice

## 2018-02-14 ENCOUNTER — Ambulatory Visit (INDEPENDENT_AMBULATORY_CARE_PROVIDER_SITE_OTHER): Payer: Medicaid Other | Admitting: Obstetrics and Gynecology

## 2018-02-14 ENCOUNTER — Encounter: Payer: Self-pay | Admitting: Obstetrics and Gynecology

## 2018-02-14 ENCOUNTER — Other Ambulatory Visit: Payer: Self-pay

## 2018-02-14 DIAGNOSIS — Z23 Encounter for immunization: Secondary | ICD-10-CM

## 2018-02-14 DIAGNOSIS — Z331 Pregnant state, incidental: Secondary | ICD-10-CM

## 2018-02-14 DIAGNOSIS — Z1389 Encounter for screening for other disorder: Secondary | ICD-10-CM

## 2018-02-14 DIAGNOSIS — Z3483 Encounter for supervision of other normal pregnancy, third trimester: Secondary | ICD-10-CM

## 2018-02-14 DIAGNOSIS — Z3A30 30 weeks gestation of pregnancy: Secondary | ICD-10-CM

## 2018-02-14 LAB — POCT URINALYSIS DIPSTICK OB
Blood, UA: NEGATIVE
Glucose, UA: NEGATIVE
Ketones, UA: NEGATIVE
Leukocytes, UA: NEGATIVE
Nitrite, UA: NEGATIVE
POC,PROTEIN,UA: NEGATIVE

## 2018-02-14 NOTE — Progress Notes (Signed)
Patient ID: Sabirah Pippert, female   DOB: October 07, 1990, 28 y.o.   MRN: [id number]    LOW-RISK PREGNANCY VISIT Patient name: Jade Palmer MRN [id number]  Date of birth: August 30, 1990 Chief Complaint:   No chief complaint on file.  History of Present Illness:   Jade Palmer is a 28 y.o. [phone] female at [redacted]w[redacted]d with an Estimated Date of Delivery: 04/20/18 being seen today for ongoing management of a low-risk pregnancy. Has hx pre-eclampsia with 1st pregnancy @ [facility], second delivery was at [facility], and she has recently moved back to [place].This is 3rd pregnancy and has step daughter. this is first boy has gained 25 lbs with this pregnancy She is accompanied by her husband. 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 Today she reports no complaints.  .  .   . denies leaking of fluid. Review of Systems:   Pertinent items are noted in HPI Denies abnormal vaginal discharge w/ itching/odor/irritation, headaches, visual changes, shortness of breath, chest pain, abdominal pain, severe nausea/vomiting, or problems with urination or bowel movements unless otherwise stated above. Pertinent History Reviewed:  Reviewed past medical,surgical, social, obstetrical and family history.  Reviewed problem list, medications and allergies. Physical Assessment:  There were no vitals filed for this visit.There is no height or weight on file to calculate BMI.        Physical Examination:   General appearance: Well appearing, and in no distress  Mental status: Alert, oriented to person, place, and time  Skin: Warm & dry  Cardiovascular: Normal heart rate noted  Respiratory: Normal respiratory effort, no distress  Abdomen: Soft, gravid, nontender  Pelvic: Cervical exam deferred         Extremities:    Fetal Status:          No results found for this or any previous visit (from the past 24 hour(s)).  Assessment & Plan:  1) Low-risk pregnancy G3P1102 at [redacted]w[redacted]d with an Estimated Date of Delivery:  04/20/18   2) Discussion of weight loss, after dleivery  Meds: No orders of the defined types were placed in this encounter.  Labs/procedures today: None  Plan:   Continue routine obstetrical care,  F/u in 2 weeks  Follow-up: No follow-ups on file.  No orders of the defined types were placed in this encounter.  By signing my name below, I, Arnette Norris, attest that this documentation has been prepared under the direction and in the presence of Tilda Burrow, MD. Electronically Signed: Arnette Norris Medical Scribe. 02/14/18. 2:43 PM.  I personally performed the services described in this documentation, which was SCRIBED in my presence. The recorded information has been reviewed and considered accurate. It has been edited as necessary during review. Tilda Burrow, MD

## 2018-02-19 ENCOUNTER — Telehealth: Payer: Self-pay | Admitting: *Deleted

## 2018-02-19 NOTE — Telephone Encounter (Signed)
Patient called with c/o vomiting and diarrhea and feeling weak, unable to keep any fluids down, also vaginal pressure. Wants to be worked in.  I advised patient if she if feeling weak and not able to keep anything down, then she should go to [facility] for fluids. Pt verbalized understanding.

## 2018-02-21 ENCOUNTER — Encounter: Payer: Self-pay | Admitting: Obstetrics and Gynecology

## 2018-02-21 ENCOUNTER — Ambulatory Visit (INDEPENDENT_AMBULATORY_CARE_PROVIDER_SITE_OTHER): Payer: Medicaid Other | Admitting: Obstetrics and Gynecology

## 2018-02-21 ENCOUNTER — Telehealth: Payer: Self-pay | Admitting: *Deleted

## 2018-02-21 VITALS — BP 133/89 | HR 96 | Temp 98.7°F | Wt 237.5 lb

## 2018-02-21 DIAGNOSIS — D649 Anemia, unspecified: Secondary | ICD-10-CM

## 2018-02-21 DIAGNOSIS — Z3A31 31 weeks gestation of pregnancy: Secondary | ICD-10-CM

## 2018-02-21 DIAGNOSIS — Z331 Pregnant state, incidental: Secondary | ICD-10-CM

## 2018-02-21 DIAGNOSIS — R319 Hematuria, unspecified: Secondary | ICD-10-CM

## 2018-02-21 DIAGNOSIS — Z1389 Encounter for screening for other disorder: Secondary | ICD-10-CM

## 2018-02-21 LAB — POCT HEMOGLOBIN: Hemoglobin: 9.4 g/dL — AB (ref 11–14.6)

## 2018-02-21 LAB — POCT URINALYSIS DIPSTICK OB
Glucose, UA: NEGATIVE
KETONES UA: NEGATIVE
Nitrite, UA: NEGATIVE

## 2018-02-21 MED ORDER — CEPHALEXIN 500 MG PO CAPS: 500.0000 mg | ORAL_CAPSULE | Freq: Three times a day (TID) | ORAL | 0 refills | Status: DC

## 2018-02-21 NOTE — Telephone Encounter (Signed)
Patient states she has been sick but is feeling better but is now having symptoms of an UTI.  She is having cramping, lower back pain, urine discoloration and discomfort.  Advised to come to our office for [place] to dip her urine.  Pt to come.

## 2018-02-21 NOTE — Progress Notes (Signed)
Subjective:  Jade Palmer is a 28 y.o. [id number] at [redacted]w[redacted]d being seen today for ongoing prenatal care.  She is currently monitored for the following issues for this low-risk pregnancy and has History of pre-eclampsia in prior pregnancy, currently pregnant; Supervision of normal pregnancy; Short interval between pregnancies affecting pregnancy, antepartum; and Seizures (HCC) on their problem list.  Patient reports AGE over the weekend. Slowly improving. Tolerating diet now. Still feels achy at times. No fever. Noted hematuria yesterday. Some dysuria. No vaginal bleeidng or LOF.  Contractions: Not present. Vag. Bleeding: None.  Movement: Present. Denies leaking of fluid.   The following portions of the patient's history were reviewed and updated as appropriate: allergies, current medications, past family history, past medical history, past social history, past surgical history and problem list. Problem list updated.  Objective:   Vitals:   02/21/18 1110  BP: 133/89  Pulse: 96  Temp: 98.7 F (37.1 C)  Weight: 237 lb 8 oz (107.7 kg)    Fetal Status:     Movement: Present     General:  Alert, oriented and cooperative. Patient is in no acute distress.  Skin: Skin is warm and dry. No rash noted.   Cardiovascular: Normal heart rate noted  Respiratory: Normal respiratory effort, no problems with respiration noted  Abdomen: Soft, gravid, appropriate for gestational age. Pain/Pressure: Present     Pelvic:  Cervical exam performed      + bladder tenderness  Extremities: Normal range of motion.  Edema: Trace  Mental Status: Normal mood and affect. Normal behavior. Normal judgment and thought content.   Urinalysis:      Assessment and Plan:  Pregnancy: G3P1102 at [redacted]w[redacted]d  1. Screening for genitourinary condition  - POC Urinalysis Dipstick OB  2. Pregnant state, incidental  - POC Urinalysis Dipstick OB  3. Anemia, unspecified type Address at next weeks appt - POCT hemoglobin  4.  Hematuria, unspecified type  Urine Culture - cephALEXin (KEFLEX) 500 MG capsule; Take 1 capsule (500 mg total) by mouth 3 (three) times daily.  Dispense: 21 capsule; Refill: 0  Preterm labor symptoms and general obstetric precautions including but not limited to vaginal bleeding, contractions, leaking of fluid and fetal movement were reviewed in detail with the patient. Please refer to After Visit Summary for other counseling recommendations.  Return for OB visit.   Hermina Staggers, MD

## 2018-02-22 ENCOUNTER — Telehealth: Payer: Self-pay | Admitting: *Deleted

## 2018-02-22 NOTE — Telephone Encounter (Signed)
Patient states she is experiencing lower back radiating to lower abdomen. Doesn't feel like they are contractions. She is unsure if she has a temp now but feels like she has had one earlier.  Informed patient her urine culture had not resulted.  Informed that she could have pyelo and is very serious during pregnancy.  Advised is she checked her temp and it was 100.4 or higher, she needed to go to [facility] for eval and possible IV antibiotics.  Denies body aches or chills.  Advised if she did not have a temp and was still uncomfortable tomorrow, to give [place] a call.  Verbalized understanding.

## 2018-02-23 LAB — URINE CULTURE

## 2018-02-26 ENCOUNTER — Telehealth: Payer: Self-pay | Admitting: *Deleted

## 2018-02-26 NOTE — Telephone Encounter (Signed)
DOB verified. Informed pt that urine culture was positive. Advised to take antibiotic as prescribed. Advised to call with any other concerns or if s/s worsen or fail to improve. Pt verbalized understanding.

## 2018-02-28 ENCOUNTER — Ambulatory Visit (INDEPENDENT_AMBULATORY_CARE_PROVIDER_SITE_OTHER): Payer: Medicaid Other | Admitting: Women's Health

## 2018-02-28 ENCOUNTER — Encounter: Payer: Self-pay | Admitting: Women's Health

## 2018-02-28 VITALS — BP 126/75 | HR 94 | Temp 98.4°F | Wt 243.4 lb

## 2018-02-28 DIAGNOSIS — Z3483 Encounter for supervision of other normal pregnancy, third trimester: Secondary | ICD-10-CM

## 2018-02-28 DIAGNOSIS — Z3A32 32 weeks gestation of pregnancy: Secondary | ICD-10-CM

## 2018-02-28 DIAGNOSIS — Z1389 Encounter for screening for other disorder: Secondary | ICD-10-CM

## 2018-02-28 DIAGNOSIS — Z331 Pregnant state, incidental: Secondary | ICD-10-CM

## 2018-02-28 DIAGNOSIS — O2343 Unspecified infection of urinary tract in pregnancy, third trimester: Secondary | ICD-10-CM

## 2018-02-28 DIAGNOSIS — O99013 Anemia complicating pregnancy, third trimester: Secondary | ICD-10-CM

## 2018-02-28 LAB — POCT URINALYSIS DIPSTICK OB
Glucose, UA: NEGATIVE
Ketones, UA: NEGATIVE
LEUKOCYTES UA: NEGATIVE
Nitrite, UA: NEGATIVE
POC,PROTEIN,UA: NEGATIVE
RBC UA: NEGATIVE

## 2018-02-28 NOTE — Patient Instructions (Signed)
Marina Goodell, I greatly value your feedback.  If you receive a survey following your visit with [place] today, we appreciate you taking the time to fill it out.  Thanks, Joellyn Haff, CNM, WHNP-BC   Call the office [phone]) or go to [facility] if:  You begin to have strong, frequent contractions  Your water breaks.  Sometimes it is a big gush of fluid, sometimes it is just a trickle that keeps getting your panties wet or running down your legs  You have vaginal bleeding.  It is normal to have a small amount of spotting if your cervix was checked.   You don't feel your baby moving like normal.  If you don't, get you something to eat and drink and lay down and focus on feeling your baby move.  You should feel at least 10 movements in 2 hours.  If you don't, you should call the office or go to [facility].     Preterm Labor and Birth Information  The normal length of a pregnancy is 39-41 weeks. Preterm labor is when labor starts before 37 completed weeks of pregnancy. What are the risk factors for preterm labor? Preterm labor is more likely to occur in women who:  Have certain infections during pregnancy such as a bladder infection, sexually transmitted infection, or infection inside the uterus (chorioamnionitis).  Have a shorter-than-normal cervix.  Have gone into preterm labor before.  Have had surgery on their cervix.  Are younger than age 46 or older than age 67.  Are African American.  Are pregnant with twins or multiple babies (multiple gestation).  Take street drugs or smoke while pregnant.  Do not gain enough weight while pregnant.  Became pregnant shortly after having been pregnant. What are the symptoms of preterm labor? Symptoms of preterm labor include:  Cramps similar to those that can happen during a menstrual period. The cramps may happen with diarrhea.  Pain in the abdomen or lower back.  Regular uterine contractions that may feel like  tightening of the abdomen.  A feeling of increased pressure in the pelvis.  Increased watery or bloody mucus discharge from the vagina.  Water breaking (ruptured amniotic sac). Why is it important to recognize signs of preterm labor? It is important to recognize signs of preterm labor because babies who are born prematurely may not be fully developed. This can put them at an increased risk for:  Long-term (chronic) heart and lung problems.  Difficulty immediately after birth with regulating body systems, including blood sugar, body temperature, heart rate, and breathing rate.  Bleeding in the brain.  Cerebral palsy.  Learning difficulties.  Death. These risks are highest for babies who are born before 34 weeks of pregnancy. How is preterm labor treated? Treatment depends on the length of your pregnancy, your condition, and the health of your baby. It may involve:  Having a stitch (suture) placed in your cervix to prevent your cervix from opening too early (cerclage).  Taking or being given medicines, such as: ? Hormone medicines. These may be given early in pregnancy to help support the pregnancy. ? Medicine to stop contractions. ? Medicines to help mature the baby's lungs. These may be prescribed if the risk of delivery is high. ? Medicines to prevent your baby from developing cerebral palsy. If the labor happens before 34 weeks of pregnancy, you may need to stay in the hospital. What should I do if I think I am in preterm labor? If you think that  you are going into preterm labor, call your health care provider right away. How can I prevent preterm labor in future pregnancies? To increase your chance of having a full-term pregnancy:  Do not use any tobacco products, such as cigarettes, chewing tobacco, and e-cigarettes. If you need help quitting, ask your health care provider.  Do not use street drugs or medicines that have not been prescribed to you during your  pregnancy.  Talk with your health care provider before taking any herbal supplements, even if you have been taking them regularly.  Make sure you gain a healthy amount of weight during your pregnancy.  Watch for infection. If you think that you might have an infection, get it checked right away.  Make sure to tell your health care provider if you have gone into preterm labor before. This information is not intended to replace advice given to you by your health care provider. Make sure you discuss any questions you have with your health care provider. Document Released: 04/16/2003 Document Revised: 07/07/2015 Document Reviewed: 06/17/2015 Elsevier Interactive Patient Education  2019 [employer].

## 2018-02-28 NOTE — Progress Notes (Signed)
   LOW-RISK PREGNANCY VISIT Patient name: Jade Palmer MRN [id number]  Date of birth: 1990-07-08 Chief Complaint:   Routine Prenatal Visit (had virus last week,coughing every since/ mucus only)  History of Present Illness:   Jade Palmer is a 28 y.o. [id number] female at [redacted]w[redacted]d with an Estimated Date of Delivery: 04/20/18 being seen today for ongoing management of a low-risk pregnancy.  Today she reports saw on mychart her hgb was 9.4 last week, taking pnv daily, not taking any extra iron. Finished keflex for uti. . Contractions: Not present.  .  Movement: Present. denies leaking of fluid. Review of Systems:   Pertinent items are noted in HPI Denies abnormal vaginal discharge w/ itching/odor/irritation, headaches, visual changes, shortness of breath, chest pain, abdominal pain, severe nausea/vomiting, or problems with urination or bowel movements unless otherwise stated above. Pertinent History Reviewed:  Reviewed past medical,surgical, social, obstetrical and family history.  Reviewed problem list, medications and allergies. Physical Assessment:   Vitals:   02/28/18 1514  BP: 126/75  Pulse: 94  Temp: 98.4 F (36.9 C)  Weight: 243 lb 6.4 oz (110.4 kg)  Body mass index is 38.12 kg/m.        Physical Examination:   General appearance: Well appearing, and in no distress  Mental status: Alert, oriented to person, place, and time  Skin: Warm & dry  Cardiovascular: Normal heart rate noted  Respiratory: Normal respiratory effort, no distress  Abdomen: Soft, gravid, nontender  Pelvic: Cervical exam deferred         Extremities: Edema: Trace  Fetal Status: Fetal Heart Rate (bpm): 125 Fundal Height: 33 cm Movement: Present    Results for orders placed or performed in visit on 02/28/18 (from the past 24 hour(s))  POC Urinalysis Dipstick OB   Collection Time: 02/28/18  3:23 PM  Result Value Ref Range   Color, UA     Clarity, UA     Glucose, UA Negative Negative   Bilirubin,  UA     Ketones, UA neg    Spec Grav, UA     Blood, UA neg    pH, UA     POC,PROTEIN,UA Negative Negative, Trace, Small (1+), Moderate (2+), Large (3+), 4+   Urobilinogen, UA     Nitrite, UA neg    Leukocytes, UA Negative Negative   Appearance     Odor      Assessment & Plan:  1) Low-risk pregnancy G3P1102 at [redacted]w[redacted]d with an Estimated Date of Delivery: 04/20/18   2) Anemia, will check CBC, continue pnv  3) H/O pre-e, continue ASA  4) Recent UTI> urine cx poc today   Meds: No orders of the defined types were placed in this encounter.  Labs/procedures today: cbc  Plan:  Continue routine obstetrical care   Reviewed: Preterm labor symptoms and general obstetric precautions including but not limited to vaginal bleeding, contractions, leaking of fluid and fetal movement were reviewed in detail with the patient.  All questions were answered  Follow-up: Return in about 2 weeks (around 03/14/2018) for LROB.  Orders Placed This Encounter  Procedures  . Urine Culture  . CBC  . POC Urinalysis Dipstick OB   Cheral Marker CNM, [facility] 02/28/2018 4:08 PM

## 2018-03-01 ENCOUNTER — Other Ambulatory Visit: Payer: Self-pay | Admitting: Women's Health

## 2018-03-01 DIAGNOSIS — O99013 Anemia complicating pregnancy, third trimester: Secondary | ICD-10-CM

## 2018-03-01 LAB — CBC
HEMATOCRIT: 24.7 % — AB (ref 34.0–46.6)
Hemoglobin: 8.3 g/dL — ABNORMAL LOW (ref 11.1–15.9)
MCH: 29.6 pg (ref 26.6–33.0)
MCHC: 33.6 g/dL (ref 31.5–35.7)
MCV: 88 fL (ref 79–97)
Platelets: 173 10*3/uL (ref 150–450)
RBC: 2.8 x10E6/uL — ABNORMAL LOW (ref 3.77–5.28)
RDW: 15 % (ref 11.7–15.4)
WBC: 9.3 10*3/uL (ref 3.4–10.8)

## 2018-03-01 MED ORDER — FERROUS SULFATE 325 (65 FE) MG PO TABS: 325.0000 mg | ORAL_TABLET | Freq: Two times a day (BID) | ORAL | 3 refills | Status: DC

## 2018-03-02 ENCOUNTER — Encounter: Payer: Self-pay | Admitting: Women's Health

## 2018-03-02 DIAGNOSIS — O2343 Unspecified infection of urinary tract in pregnancy, third trimester: Secondary | ICD-10-CM | POA: Insufficient documentation

## 2018-03-02 LAB — URINE CULTURE: Organism ID, Bacteria: NO GROWTH

## 2018-03-12 ENCOUNTER — Telehealth: Payer: Self-pay | Admitting: Obstetrics and Gynecology

## 2018-03-12 ENCOUNTER — Telehealth: Payer: Self-pay | Admitting: *Deleted

## 2018-03-12 ENCOUNTER — Other Ambulatory Visit: Payer: Self-pay | Admitting: Women's Health

## 2018-03-12 MED ORDER — OSELTAMIVIR PHOSPHATE 75 MG PO CAPS: 75.0000 mg | ORAL_CAPSULE | Freq: Two times a day (BID) | ORAL | 0 refills | Status: DC

## 2018-03-12 NOTE — Telephone Encounter (Signed)
Patient states she has had a temp 102 off and on since 4am.  Bodyaches, chills, diarrhea. Daughter has been diagnosed with the flu.  Doesn't have to be tested if she can be treated based on symptoms per patient.

## 2018-03-12 NOTE — Telephone Encounter (Signed)
Patient has had a fever off and on, coughing, achy, weak, and diarrhea. Patient states her daughter has the flu and she'd like to be tested.  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  Please advise.  [address]  [phone]

## 2018-03-13 ENCOUNTER — Encounter: Payer: Self-pay | Admitting: *Deleted

## 2018-03-14 ENCOUNTER — Ambulatory Visit (INDEPENDENT_AMBULATORY_CARE_PROVIDER_SITE_OTHER): Payer: Medicaid Other | Admitting: Obstetrics and Gynecology

## 2018-03-14 ENCOUNTER — Encounter: Payer: Self-pay | Admitting: Obstetrics and Gynecology

## 2018-03-14 VITALS — BP 126/81 | HR 85 | Wt 239.4 lb

## 2018-03-14 DIAGNOSIS — Z1389 Encounter for screening for other disorder: Secondary | ICD-10-CM

## 2018-03-14 DIAGNOSIS — Z3A34 34 weeks gestation of pregnancy: Secondary | ICD-10-CM | POA: Diagnosis not present

## 2018-03-14 DIAGNOSIS — Z331 Pregnant state, incidental: Secondary | ICD-10-CM

## 2018-03-14 DIAGNOSIS — O2343 Unspecified infection of urinary tract in pregnancy, third trimester: Secondary | ICD-10-CM | POA: Diagnosis not present

## 2018-03-14 DIAGNOSIS — Z3483 Encounter for supervision of other normal pregnancy, third trimester: Secondary | ICD-10-CM

## 2018-03-14 DIAGNOSIS — O99013 Anemia complicating pregnancy, third trimester: Secondary | ICD-10-CM

## 2018-03-14 LAB — POCT URINALYSIS DIPSTICK OB
Glucose, UA: NEGATIVE
Ketones, UA: NEGATIVE
Leukocytes, UA: NEGATIVE
Nitrite, UA: NEGATIVE

## 2018-03-14 MED ORDER — NITROFURANTOIN MACROCRYSTAL 100 MG PO CAPS: 100.0000 mg | ORAL_CAPSULE | Freq: Every day | ORAL | 1 refills | Status: DC

## 2018-03-14 MED ORDER — NITROFURANTOIN MONOHYD MACRO 100 MG PO CAPS: 100.0000 mg | ORAL_CAPSULE | Freq: Two times a day (BID) | ORAL | 0 refills | Status: DC

## 2018-03-14 NOTE — Progress Notes (Signed)
Patient ID: Jade Palmer, female   DOB: 12-Oct-1990, 28 y.o.   MRN: [id number]    LOW-RISK PREGNANCY VISIT Patient name: Jade Palmer MRN [id number]  Date of birth: [birth year] Chief Complaint:   Routine Prenatal Visit  History of Present Illness:   Jade Palmer is a 28 y.o. [id number] female at [redacted]w[redacted]d with an Estimated Date of Delivery: 04/20/18 being seen today for ongoing management of a low-risk pregnancy. Pre-eclampsia in first pregnancy and was induced  Her second pregnancy due to BP being elevated. Takes iron tablets BID to help with anemia and causes her to have constipation.  Today she reports pressure. Contractions: Irregular.  .  Movement: Present. denies leaking of fluid. Review of Systems:   Pertinent items are noted in HPI DENIES flank pain.  Denies abnormal vaginal discharge w/ itching/odor/irritation, headaches, visual changes, shortness of breath, chest pain, abdominal pain, severe nausea/vomiting, or problems with urination or bowel movements unless otherwise stated above. Pertinent History Reviewed:  Reviewed past medical,surgical, social, obstetrical and family history.  Reviewed problem list, medications and allergies. Physical Assessment:   Vitals:   03/14/18 1549  BP: 126/81  Pulse: 85  Weight: 239 lb 6.4 oz (108.6 kg)  Body mass index is 37.5 kg/m.        Physical Examination:   General appearance: Well appearing, and in no distress  Mental status: Alert, oriented to person, place, and time  Skin: Warm & dry  Cardiovascular: Normal heart rate noted  Respiratory: Normal respiratory effort, no distress  Abdomen: Soft, gravid, nontender  Pelvic: Cervical exam deferred         Extremities: Edema: Trace  Fetal Status: Fetal Heart Rate (bpm): 129 Fundal Height: 36 cm Movement: Present    Results for orders placed or performed in visit on 03/14/18 (from the past 24 hour(s))  POC Urinalysis Dipstick OB   Collection Time: 03/14/18  3:55 PM    Result Value Ref Range   Color, UA     Clarity, UA     Glucose, UA Negative Negative   Bilirubin, UA     Ketones, UA neg    Spec Grav, UA     Blood, UA 2+    pH, UA     POC,PROTEIN,UA Trace Negative, Trace, Small (1+), Moderate (2+), Large (3+), 4+   Urobilinogen, UA     Nitrite, UA neg    Leukocytes, UA Negative Negative   Appearance     Odor      Assessment & Plan:  1) Low-risk pregnancy G3P1102 at [redacted]w[redacted]d with an Estimated Date of Delivery: 04/20/18   2) Anemia, recheck HgB levels in 2 weeks if unimproved, consider Feraheme  3 recurrent uti, rx Macrobid x 7d then suppress HS with Macrodantin 100 hs. Meds: No orders of the defined types were placed in this encounter.  Labs/procedures today: None  Plan:  1. Continue routine obstetrical care  2. F/u in 2 weeks gbs, gcchl, recheck HgB level 3. Feraheme if Hgb levels not above 9 4. Rx Macrobid BID  Reviewed: Preterm labor symptoms and general obstetric precautions including but not limited to vaginal bleeding, contractions, leaking of fluid and fetal movement were reviewed in detail with the patient.  All questions were answered  Follow-up: Return in about 2 weeks (around 03/28/2018).  Orders Placed This Encounter  Procedures  . POC Urinalysis Dipstick OB   By signing my name below, I, Arnette Norris, attest that this documentation has been prepared under the direction and in  the presence of Tilda Burrow, MD. Electronically Signed: Arnette Norris Medical Scribe. 03/14/18. 4:19 PM.  I personally performed the services described in this documentation, which was SCRIBED in my presence. The recorded information has been reviewed and considered accurate. It has been edited as necessary during review. Tilda Burrow, MD

## 2018-03-14 NOTE — Addendum Note (Signed)
Addended by: Colen Darling on: 03/14/2018 04:32 PM   Modules accepted: Orders

## 2018-03-15 LAB — URINALYSIS, ROUTINE W REFLEX MICROSCOPIC
BILIRUBIN UA: NEGATIVE
Glucose, UA: NEGATIVE
Nitrite, UA: NEGATIVE
Specific Gravity, UA: 1.024 (ref 1.005–1.030)
Urobilinogen, Ur: 0.2 mg/dL (ref 0.2–1.0)
pH, UA: 6 (ref 5.0–7.5)

## 2018-03-15 LAB — MICROSCOPIC EXAMINATION: RBC, UA: >30 /hpf — AB (ref 0–2)

## 2018-03-16 LAB — URINE CULTURE

## 2018-03-19 ENCOUNTER — Other Ambulatory Visit: Payer: Self-pay

## 2018-03-19 ENCOUNTER — Inpatient Hospital Stay (HOSPITAL_COMMUNITY)
Admission: AD | Admit: 2018-03-19 | Discharge: 2018-03-22 | DRG: 807 | Disposition: A | Discharge status: Home / Self Care | Payer: Medicaid Other | Attending: Family Medicine | Admitting: Family Medicine

## 2018-03-19 ENCOUNTER — Telehealth: Payer: Self-pay | Admitting: Obstetrics and Gynecology

## 2018-03-19 ENCOUNTER — Ambulatory Visit (INDEPENDENT_AMBULATORY_CARE_PROVIDER_SITE_OTHER): Payer: Medicaid Other

## 2018-03-19 VITALS — BP 150/83 | HR 92 | Ht 67.0 in | Wt 244.6 lb

## 2018-03-19 DIAGNOSIS — O9902 Anemia complicating childbirth: Secondary | ICD-10-CM | POA: Diagnosis present

## 2018-03-19 DIAGNOSIS — Z0130 Encounter for examination of blood pressure without abnormal findings: Secondary | ICD-10-CM

## 2018-03-19 DIAGNOSIS — Z8759 Personal history of other complications of pregnancy, childbirth and the puerperium: Secondary | ICD-10-CM | POA: Diagnosis present

## 2018-03-19 DIAGNOSIS — O99344 Other mental disorders complicating childbirth: Secondary | ICD-10-CM | POA: Diagnosis present

## 2018-03-19 DIAGNOSIS — D649 Anemia, unspecified: Secondary | ICD-10-CM | POA: Diagnosis present

## 2018-03-19 DIAGNOSIS — O1414 Severe pre-eclampsia complicating childbirth: Principal | ICD-10-CM | POA: Diagnosis present

## 2018-03-19 DIAGNOSIS — O99019 Anemia complicating pregnancy, unspecified trimester: Secondary | ICD-10-CM | POA: Diagnosis present

## 2018-03-19 DIAGNOSIS — O09299 Supervision of pregnancy with other poor reproductive or obstetric history, unspecified trimester: Secondary | ICD-10-CM

## 2018-03-19 DIAGNOSIS — O0990 Supervision of high risk pregnancy, unspecified, unspecified trimester: Secondary | ICD-10-CM

## 2018-03-19 DIAGNOSIS — Z1389 Encounter for screening for other disorder: Secondary | ICD-10-CM

## 2018-03-19 DIAGNOSIS — R569 Unspecified convulsions: Secondary | ICD-10-CM

## 2018-03-19 DIAGNOSIS — O09899 Supervision of other high risk pregnancies, unspecified trimester: Secondary | ICD-10-CM

## 2018-03-19 DIAGNOSIS — F418 Other specified anxiety disorders: Secondary | ICD-10-CM | POA: Diagnosis present

## 2018-03-19 DIAGNOSIS — Z3A35 35 weeks gestation of pregnancy: Secondary | ICD-10-CM | POA: Diagnosis not present

## 2018-03-19 DIAGNOSIS — Z3483 Encounter for supervision of other normal pregnancy, third trimester: Secondary | ICD-10-CM

## 2018-03-19 DIAGNOSIS — R030 Elevated blood-pressure reading, without diagnosis of hypertension: Secondary | ICD-10-CM | POA: Diagnosis present

## 2018-03-19 DIAGNOSIS — Z331 Pregnant state, incidental: Secondary | ICD-10-CM

## 2018-03-19 LAB — POCT URINALYSIS DIPSTICK OB
Blood, UA: NEGATIVE
Glucose, UA: NEGATIVE
KETONES UA: NEGATIVE
Leukocytes, UA: NEGATIVE
NITRITE UA: NEGATIVE
POC,PROTEIN,UA: NEGATIVE

## 2018-03-19 LAB — CBC WITH DIFFERENTIAL/PLATELET
BASOS ABS: 0 10*3/uL (ref 0.0–0.1)
Basophils Relative: 0 %
EOS ABS: 0.1 10*3/uL (ref 0.0–0.5)
Eosinophils Relative: 1 %
HCT: 28.6 % — ABNORMAL LOW (ref 36.0–46.0)
Hemoglobin: 8.9 g/dL — ABNORMAL LOW (ref 12.0–15.0)
LYMPHS ABS: 2.2 10*3/uL (ref 0.7–4.0)
Lymphocytes Relative: 20 %
MCH: 29.2 pg (ref 26.0–34.0)
MCHC: 31.1 g/dL (ref 30.0–36.0)
MCV: 93.8 fL (ref 80.0–100.0)
Monocytes Absolute: 0.5 10*3/uL (ref 0.1–1.0)
Monocytes Relative: 4 %
Neutro Abs: 7.9 10*3/uL — ABNORMAL HIGH (ref 1.7–7.7)
Neutrophils Relative %: 75 %
Platelets: 163 10*3/uL (ref 150–400)
RBC: 3.05 MIL/uL — AB (ref 3.87–5.11)
RDW: 16.8 % — ABNORMAL HIGH (ref 11.5–15.5)
WBC: 10.6 10*3/uL — ABNORMAL HIGH (ref 4.0–10.5)
nRBC: 0 % (ref 0.0–0.2)

## 2018-03-19 LAB — COMPREHENSIVE METABOLIC PANEL
ALT: 12 U/L (ref 0–44)
AST: 16 U/L (ref 15–41)
Albumin: 3.1 g/dL — ABNORMAL LOW (ref 3.5–5.0)
Alkaline Phosphatase: 70 U/L (ref 38–126)
Anion gap: 11 (ref 5–15)
BUN: 7 mg/dL (ref 6–20)
CHLORIDE: 104 mmol/L (ref 98–111)
CO2: 19 mmol/L — ABNORMAL LOW (ref 22–32)
Calcium: 8.5 mg/dL — ABNORMAL LOW (ref 8.9–10.3)
Creatinine, Ser: 0.55 mg/dL (ref 0.44–1.00)
GFR calc Af Amer: >60 mL/min (ref >60)
GFR calc non Af Amer: >60 mL/min (ref >60)
GLUCOSE: 121 mg/dL — AB (ref 70–99)
Potassium: 4 mmol/L (ref 3.5–5.1)
Sodium: 134 mmol/L — ABNORMAL LOW (ref 135–145)
Total Bilirubin: 0.6 mg/dL (ref 0.3–1.2)
Total Protein: 6.4 g/dL — ABNORMAL LOW (ref 6.5–8.1)

## 2018-03-19 LAB — URINALYSIS, ROUTINE W REFLEX MICROSCOPIC
Bilirubin Urine: NEGATIVE
Glucose, UA: NEGATIVE mg/dL
Ketones, ur: 40 mg/dL — AB
NITRITE: NEGATIVE
Protein, ur: NEGATIVE mg/dL
Specific Gravity, Urine: 1.02 (ref 1.005–1.030)
pH: 6.5 (ref 5.0–8.0)

## 2018-03-19 LAB — PROTEIN / CREATININE RATIO, URINE
Creatinine, Urine: 144 mg/dL
Protein Creatinine Ratio: 0.13 mg/mg{Cre} (ref 0.00–0.15)
TOTAL PROTEIN, URINE: 19 mg/dL

## 2018-03-19 LAB — URINALYSIS, MICROSCOPIC (REFLEX)

## 2018-03-19 MED ORDER — OXYTOCIN BOLUS FROM INFUSION
500.0000 mL | Freq: Once | INTRAVENOUS | Status: AC
  Administered 2018-03-20: 500 mL via INTRAVENOUS

## 2018-03-19 MED ORDER — DIPHENHYDRAMINE HCL 50 MG/ML IJ SOLN
25.0000 mg | Freq: Once | INTRAMUSCULAR | Status: AC
  Administered 2018-03-19: 25 mg via INTRAVENOUS
  Filled 2018-03-19: qty 1

## 2018-03-19 MED ORDER — SODIUM CHLORIDE 0.9 % IV SOLN
5.0000 10*6.[IU] | Freq: Once | INTRAVENOUS | Status: AC
  Administered 2018-03-20: 5 10*6.[IU] via INTRAVENOUS
  Filled 2018-03-19: qty 5

## 2018-03-19 MED ORDER — HYDRALAZINE HCL 20 MG/ML IJ SOLN: 10.0000 mg | INTRAMUSCULAR | Status: DC | PRN

## 2018-03-19 MED ORDER — METOCLOPRAMIDE HCL 5 MG/ML IJ SOLN
10.0000 mg | Freq: Once | INTRAMUSCULAR | Status: AC
  Administered 2018-03-19: 10 mg via INTRAVENOUS
  Filled 2018-03-19: qty 2

## 2018-03-19 MED ORDER — OXYCODONE-ACETAMINOPHEN 5-325 MG PO TABS: 1.0000 | ORAL_TABLET | ORAL | Status: DC | PRN

## 2018-03-19 MED ORDER — LIDOCAINE HCL (PF) 1 % IJ SOLN
30.0000 mL | INTRAMUSCULAR | Status: DC | PRN
  Filled 2018-03-19: qty 30

## 2018-03-19 MED ORDER — LABETALOL HCL 5 MG/ML IV SOLN: 20.0000 mg | INTRAVENOUS | Status: DC | PRN

## 2018-03-19 MED ORDER — PENICILLIN G 3 MILLION UNITS IVPB - SIMPLE MED
3.0000 10*6.[IU] | INTRAVENOUS | Status: DC
  Administered 2018-03-20 (×2): 3 10*6.[IU] via INTRAVENOUS
  Filled 2018-03-19 (×5): qty 100

## 2018-03-19 MED ORDER — LACTATED RINGERS IV SOLN: 500.0000 mL | INTRAVENOUS | Status: DC | PRN

## 2018-03-19 MED ORDER — OXYCODONE-ACETAMINOPHEN 5-325 MG PO TABS: 2.0000 | ORAL_TABLET | ORAL | Status: DC | PRN

## 2018-03-19 MED ORDER — MAGNESIUM SULFATE BOLUS VIA INFUSION
6.0000 g | Freq: Once | INTRAVENOUS | Status: AC
  Administered 2018-03-20: 6 g via INTRAVENOUS
  Filled 2018-03-19: qty 500

## 2018-03-19 MED ORDER — ONDANSETRON HCL 4 MG/2ML IJ SOLN: 4.0000 mg | Freq: Four times a day (QID) | INTRAMUSCULAR | Status: DC | PRN

## 2018-03-19 MED ORDER — LABETALOL HCL 5 MG/ML IV SOLN: 40.0000 mg | INTRAVENOUS | Status: DC | PRN

## 2018-03-19 MED ORDER — OXYCODONE-ACETAMINOPHEN 5-325 MG PO TABS
2.0000 | ORAL_TABLET | Freq: Once | ORAL | Status: AC
  Administered 2018-03-19: 2 via ORAL
  Filled 2018-03-19: qty 2

## 2018-03-19 MED ORDER — LABETALOL HCL 5 MG/ML IV SOLN: 80.0000 mg | INTRAVENOUS | Status: DC | PRN

## 2018-03-19 MED ORDER — ACETAMINOPHEN 325 MG PO TABS
650.0000 mg | ORAL_TABLET | ORAL | Status: DC | PRN
  Administered 2018-03-20 (×2): 650 mg via ORAL
  Filled 2018-03-19 (×2): qty 2

## 2018-03-19 MED ORDER — ZOLPIDEM TARTRATE 5 MG PO TABS: 5.0000 mg | ORAL_TABLET | Freq: Every evening | ORAL | Status: DC | PRN

## 2018-03-19 MED ORDER — LACTATED RINGERS IV SOLN
INTRAVENOUS | Status: DC
  Administered 2018-03-19: 125 mL/h via INTRAVENOUS
  Administered 2018-03-20 (×2): via INTRAVENOUS

## 2018-03-19 MED ORDER — SOD CITRATE-CITRIC ACID 500-334 MG/5ML PO SOLN: 30.0000 mL | ORAL | Status: DC | PRN

## 2018-03-19 MED ORDER — MISOPROSTOL 25 MCG QUARTER TABLET: 25.0000 ug | ORAL_TABLET | ORAL | Status: DC

## 2018-03-19 MED ORDER — BETAMETHASONE SOD PHOS & ACET 6 (3-3) MG/ML IJ SUSP
12.0000 mg | INTRAMUSCULAR | Status: DC
  Administered 2018-03-20: 12 mg via INTRAMUSCULAR
  Filled 2018-03-19 (×3): qty 2

## 2018-03-19 MED ORDER — TERBUTALINE SULFATE 1 MG/ML IJ SOLN: 0.2500 mg | Freq: Once | INTRAMUSCULAR | Status: DC | PRN

## 2018-03-19 MED ORDER — MAGNESIUM SULFATE 40 G IN LACTATED RINGERS - SIMPLE
2.0000 g/h | INTRAVENOUS | Status: DC
  Filled 2018-03-19: qty 500

## 2018-03-19 MED ORDER — OXYTOCIN 40 UNITS IN NORMAL SALINE INFUSION - SIMPLE MED
2.5000 [IU]/h | INTRAVENOUS | Status: DC
  Filled 2018-03-19: qty 1000

## 2018-03-19 NOTE — Progress Notes (Signed)
Pt transferred to L and D room 170. Report given to RN.

## 2018-03-19 NOTE — H&P (Addendum)
HPI Jade Palmer is a 28 y.o. [id number] at [redacted]w[redacted]d who presents to MAU with chief complaint of new onset elevated BP. She endorses elevated BPs and blurry vision last weekend. She also endorses recurrent headache throughout the day Saturday while she attended a baby shower. She did not take medicine for pain at that time but when she woke up with a headache Sunday morning she attempted to manage her symptoms with rest and Tylenol. This reduced but did not completely alleviate her headache. During her work day today she asked her supervisor to take a manual blood pressure. The result was 200s/100s.   Upon arrival to MAU patient's only complaint is 9/10 headache. She took 650mg  of Tylenol around 4pm but has not experienced relief and has not taken additional medication or tried other treatments. Her headache is frontal, does not radiate. She denies visual disturbances, RUQ pain, new onset swelling, SOB, dizziness,  DFM, vaginal bleeding or LOF.   Patient's first pregnancy was complicated by Preeclampsia. She states she is not consistently taking daily Aspirin 81mg  as advised.           OB History    Gravida  3   Para  2   Term  1   Preterm  1   AB      Living  2     SAB      TAB      Ectopic      Multiple  0   Live Births  2               Past Medical History:  Diagnosis Date  . Calculus of kidney affecting pregnancy 04/11/2017   Kidney stone  . Contraceptive education 04/12/2013  . History of kidney stones   . Seizures (HCC)    had seizure at age 52. Unknown etiology- no meds for this in about 5 years         Past Surgical History:  Procedure Laterality Date  . BALLOON DILATION Right 04/25/2013   Procedure: RIGHT URETER BALLOON DILATION;  Surgeon: Ky Barban, MD;  Location: [facility];  Service: Urology;  Laterality: Right;  . CYSTOSCOPY WITH STENT PLACEMENT Left 04/12/2017   Procedure: CYSTOSCOPY;  Surgeon: Riki Altes, MD;  Location: [facility]  [facility];  Service: Urology;  Laterality: Left;  . CYSTOSCOPY/RETROGRADE/URETEROSCOPY/STONE EXTRACTION WITH BASKET Right 04/25/2013   Procedure: CYSTOSCOPY/RIGHT RETROGRADE/RIGHT URETEROSCOPY/BASKET;  Surgeon: Ky Barban, MD;  Location: [facility];  Service: Urology;  Laterality: Right;  . TONSILLECTOMY    . URETEROSCOPY WITH HOLMIUM LASER LITHOTRIPSY Left 04/12/2017   Procedure: URETEROSCOPY;  Surgeon: Riki Altes, MD;  Location: [facility];  Service: Urology;  Laterality: Left;         Family History  Problem Relation Age of Onset  . Hyperlipidemia Mother   . Hypertension Father   . Cancer Maternal Grandmother        lung  . Cancer Maternal Grandfather        bone  . Diabetes Paternal Grandfather   . Hypertension Paternal Grandmother     Social History        Tobacco Use  . Smoking status: Never Smoker  . Smokeless tobacco: Never Used  Substance Use Topics  . Alcohol use: No    Comment: rarely  . Drug use: No    Allergies: No Known Allergies         Medications Prior to Admission  Medication Sig Dispense Refill Last Dose  . aspirin  EC 81 MG tablet Take 2 tablets (162 mg total) by mouth daily. 60 tablet 6 Taking  . cephALEXin (KEFLEX) 500 MG capsule Take 1 capsule (500 mg total) by mouth 3 (three) times daily. 21 capsule 0 Taking  . ferrous sulfate 325 (65 FE) MG tablet Take 1 tablet (325 mg total) by mouth 2 (two) times daily with a meal. 60 tablet 3 Taking  . nitrofurantoin (MACRODANTIN) 100 MG capsule Take 1 capsule (100 mg total) by mouth at bedtime. Upon completing 7 day macrobid (Patient not taking: Reported on 03/19/2018) 30 capsule 1 Not Taking  . nitrofurantoin, macrocrystal-monohydrate, (MACROBID) 100 MG capsule Take 1 capsule (100 mg total) by mouth 2 (two) times daily. For uti (Patient not taking: Reported on 03/19/2018) 14 capsule 0 Not Taking  . oseltamivir (TAMIFLU) 75 MG capsule Take 1 capsule (75 mg total) by mouth 2 (two) times daily. X  5days (Patient not taking: Reported on 03/19/2018) 10 capsule 0 Not Taking  . Prenatal Vit-Fe Fumarate-FA (PRENATAL MULTIVITAMIN) TABS tablet Take 1 tablet by mouth daily at 12 noon.   Taking     FAMILY TREE  LAB RESULTS  Language English Pap 04/20/16 neg  Initiated care at 6wk transfer @ 27wk GC/CT Initial:            36wks:  Dating by 6wk U/S    Support person [employer]    CfDNA: neg    [place]/HgbE   Flu vaccine 11/03/17 CF   TDaP vaccine 02/14/18 SMA   Rhogam       Blood Type O/Negative/-- (08/09 1552)  Anatomy US Normal female '[place]' Antibody Negative (12/18 0921)  Feeding Plan breast HBsAg Negative (08/09 1552)  Contraception IUD RPR Non Reactive (12/18 0921)  Circumcision Yes @ FT Rubella  6.50 (08/09 1552)  Pediatrician [facility] HIV Non Reactive (12/18 [id number])  Prenatal Classes declined      GTT/A1C Early:      26-28wks:82/104/106  BTL Consent  GBS        [ ]  PCN allergy  VBAC Consent     Waterbirth [ ] Class [ ] Consent [ ] CNM visit PP Needs        Review of Systems  Constitutional: Negative for chills, fatigue and fever.  Eyes: Positive for photophobia.  Respiratory: Negative for shortness of breath.   Gastrointestinal: Negative for abdominal pain.  Genitourinary: Negative for vaginal bleeding, vaginal discharge and vaginal pain.  Musculoskeletal: Negative for back pain.  Neurological: Positive for headaches. Negative for dizziness, syncope and weakness.  All other systems reviewed and are negative.  Physical Exam   Blood pressure (!) 153/89, pulse (!) 103, temperature 98.4 F (36.9 C), temperature source Oral, resp. rate 20, height 5\' 7"  (1.702 m), weight 111.5 kg, not currently breastfeeding.  Physical Exam  Nursing note and vitals reviewed. Constitutional: She is oriented to person, place, and time. She appears well-developed and well-nourished.  Cardiovascular: Normal rate.  Respiratory: Effort normal and breath sounds normal. No respiratory distress.   GI:  Gravid  Neurological: She is alert and oriented to person, place, and time. She has normal reflexes.  Skin: Skin is warm and dry.  Cx:  1-2/50/-2 Psychiatric: She has a normal mood and affect. Her behavior is normal. Judgment and thought content normal.    MAU Course/MDM   --Headache not responsive to PIO Percoet or IV Meds --Negative PEC labs --Reactive tracing: baseline 125, moderate variability, positive accels, no decels --Toco: quiet --OB history, prenatal records and MAU labs reviewed  with Dr. Vergie LivingPickens  Patient Vitals for the past 24 hrs:  BP Temp Temp src Pulse Resp SpO2 Height Weight  03/19/18 2316 137/87 - - 94 18 - - -  03/19/18 2301 (!) 85/71 - - 96 - - - -  03/19/18 2246 (!) 156/80 - - 80 18 - - -  03/19/18 2240 (!) 146/87 - - 87 18 98 % - -  03/19/18 2216 (!) 136/92 - - 85 18 98 % - -  03/19/18 2204 (!) 135/98 - - (!) 106 - - - -  03/19/18 2145 (!) 138/98 - - 96 20 99 % - -  03/19/18 2131 (!) 147/118 - - (!) 103 20 - - -  03/19/18 2119 (!) 151/104 - - (!) 109 18 98 % - -  03/19/18 2049 (!) 153/89 - - (!) 103 18 - - -  03/19/18 2034 (!) 147/94 98.4 F (36.9 C) Oral (!) 110 20 - 5\' 7"  (1.702 m) 111.5 kg    LabResultsLast24Hours       Results for orders placed or performed during the hospital encounter of 03/19/18 (from the past 24 hour(s))  CBC with Differential/Platelet     Status: Abnormal   Collection Time: 03/19/18  8:11 PM  Result Value Ref Range   WBC 10.6 (H) 4.0 - 10.5 K/uL   RBC 3.05 (L) 3.87 - 5.11 MIL/uL   Hemoglobin 8.9 (L) 12.0 - 15.0 g/dL   HCT 16.128.6 (L) 09.636.0 - 04.546.0 %   MCV 93.8 80.0 - 100.0 fL   MCH 29.2 26.0 - 34.0 pg   MCHC 31.1 30.0 - 36.0 g/dL   RDW 40.916.8 (H) 81.111.5 - 91.415.5 %   Platelets 163 150 - 400 K/uL   nRBC 0.0 0.0 - 0.2 %   Neutrophils Relative % 75 %   Neutro Abs 7.9 (H) 1.7 - 7.7 K/uL   Lymphocytes Relative 20 %   Lymphs Abs 2.2 0.7 - 4.0 K/uL   Monocytes Relative 4 %   Monocytes Absolute 0.5 0.1 -  1.0 K/uL   Eosinophils Relative 1 %   Eosinophils Absolute 0.1 0.0 - 0.5 K/uL   Basophils Relative 0 %   Basophils Absolute 0.0 0.0 - 0.1 K/uL  Comprehensive metabolic panel     Status: Abnormal   Collection Time: 03/19/18  8:11 PM  Result Value Ref Range   Sodium 134 (L) 135 - 145 mmol/L   Potassium 4.0 3.5 - 5.1 mmol/L   Chloride 104 98 - 111 mmol/L   CO2 19 (L) 22 - 32 mmol/L   Glucose, Bld 121 (H) 70 - 99 mg/dL   BUN 7 6 - 20 mg/dL   Creatinine, Ser 7.820.55 0.44 - 1.00 mg/dL   Calcium 8.5 (L) 8.9 - 10.3 mg/dL   Total Protein 6.4 (L) 6.5 - 8.1 g/dL   Albumin 3.1 (L) 3.5 - 5.0 g/dL   AST 16 15 - 41 U/L   ALT 12 0 - 44 U/L   Alkaline Phosphatase 70 38 - 126 U/L   Total Bilirubin 0.6 0.3 - 1.2 mg/dL   GFR calc non Af Amer >60 >60 mL/min   GFR calc Af Amer >60 >60 mL/min   Anion gap 11 5 - 15  Urinalysis, Routine w reflex microscopic     Status: Abnormal   Collection Time: 03/19/18  8:42 PM  Result Value Ref Range   Color, Urine YELLOW YELLOW   APPearance CLEAR CLEAR   Specific Gravity, Urine 1.020 1.005 - 1.030  pH 6.5 5.0 - 8.0   Glucose, UA NEGATIVE NEGATIVE mg/dL   Hgb urine dipstick SMALL (A) NEGATIVE   Bilirubin Urine NEGATIVE NEGATIVE   Ketones, ur 40 (A) NEGATIVE mg/dL   Protein, ur NEGATIVE NEGATIVE mg/dL   Nitrite NEGATIVE NEGATIVE   Leukocytes, UA TRACE (A) NEGATIVE  Protein / creatinine ratio, urine     Status: None   Collection Time: 03/19/18  8:42 PM  Result Value Ref Range   Creatinine, Urine 144.00 mg/dL   Total Protein, Urine 19 mg/dL   Protein Creatinine Ratio 0.13 0.00 - 0.15 mg/mg[Cre]  Urinalysis, Microscopic (reflex)     Status: Abnormal   Collection Time: 03/19/18  8:42 PM  Result Value Ref Range   RBC / HPF 0-5 0 - 5 RBC/hpf   WBC, UA 0-5 0 - 5 WBC/hpf   Bacteria, UA RARE (A) NONE SEEN   Squamous Epithelial / LPF 0-5 0 - 5   Mucus PRESENT        Assessment and Plan  --28 y.o. [id number] at [redacted]w[redacted]d   --Reactive tracing --Vertex confirmed with bedside ultrasound --Initiate Betamethasone and MgSO4 infusion --Admit to [employer] for IOL for Severe Preeclampsia:  Oral cytotec and foley placed and inflated w/60cc H20 --GBS prophylaxis (culture pending) --Report called to [facility] team  Calvert Cantor, CNM 03/19/2018, 11:32 PM

## 2018-03-19 NOTE — MAU Provider Note (Signed)
History     CSN: [id number]  Arrival date and time: 03/19/18 1950   First Provider Initiated Contact with Patient 03/19/18 2110      Chief Complaint  Patient presents with  . Hypertension   HPI Jade Palmer is a 28 y.o. [id number] at [redacted]w[redacted]d who presents to MAU with chief complaint of new onset elevated BP. She endorses elevated BPs and blurry vision last weekend. She also endorses recurrent headache throughout the day Saturday while she attended a baby shower. She did not take medicine for pain at that time but when she woke up with a headache Sunday morning she attempted to manage her symptoms with rest and Tylenol. This reduced but did not completely alleviate her headache. During her work day today she asked her supervisor to take a manual blood pressure. The result was 200s/100s.   Upon arrival to MAU patient's only complaint is 9/10 headache. She took 650mg  of Tylenol around 4pm but has not experienced relief and has not taken additional medication or tried other treatments. Her headache is frontal, does not radiate. She denies visual disturbances, RUQ pain, new onset swelling, SOB, dizziness,  DFM, vaginal bleeding or LOF.   Patient's first pregnancy was complicated by Preeclampsia. She states she is not consistently taking daily Aspirin 81mg  as advised.   OB History    Gravida  3   Para  2   Term  1   Preterm  1   AB      Living  2     SAB      TAB      Ectopic      Multiple  0   Live Births  2           Past Medical History:  Diagnosis Date  . Calculus of kidney affecting pregnancy 04/11/2017   Kidney stone  . Contraceptive education 04/12/2013  . History of kidney stones   . Seizures (HCC)    had seizure at age 52. Unknown etiology- no meds for this in about 5 years    Past Surgical History:  Procedure Laterality Date  . BALLOON DILATION Right 04/25/2013   Procedure: RIGHT URETER BALLOON DILATION;  Surgeon: Ky Barban, MD;  Location: [facility];  Service: Urology;  Laterality: Right;  . CYSTOSCOPY WITH STENT PLACEMENT Left 04/12/2017   Procedure: CYSTOSCOPY;  Surgeon: Riki Altes, MD;  Location: [facility];  Service: Urology;  Laterality: Left;  . CYSTOSCOPY/RETROGRADE/URETEROSCOPY/STONE EXTRACTION WITH BASKET Right 04/25/2013   Procedure: CYSTOSCOPY/RIGHT RETROGRADE/RIGHT URETEROSCOPY/BASKET;  Surgeon: Ky Barban, MD;  Location: [facility];  Service: Urology;  Laterality: Right;  . TONSILLECTOMY    . URETEROSCOPY WITH HOLMIUM LASER LITHOTRIPSY Left 04/12/2017   Procedure: URETEROSCOPY;  Surgeon: Riki Altes, MD;  Location: [facility];  Service: Urology;  Laterality: Left;    Family History  Problem Relation Age of Onset  . Hyperlipidemia Mother   . Hypertension Father   . Cancer Maternal Grandmother        lung  . Cancer Maternal Grandfather        bone  . Diabetes Paternal Grandfather   . Hypertension Paternal Grandmother     Social History   Tobacco Use  . Smoking status: Never Smoker  . Smokeless tobacco: Never Used  Substance Use Topics  . Alcohol use: No    Comment: rarely  . Drug use: No    Allergies: No Known Allergies  Medications Prior to Admission  Medication Sig Dispense  Refill Last Dose  . aspirin EC 81 MG tablet Take 2 tablets (162 mg total) by mouth daily. 60 tablet 6 Taking  . cephALEXin (KEFLEX) 500 MG capsule Take 1 capsule (500 mg total) by mouth 3 (three) times daily. 21 capsule 0 Taking  . ferrous sulfate 325 (65 FE) MG tablet Take 1 tablet (325 mg total) by mouth 2 (two) times daily with a meal. 60 tablet 3 Taking  . nitrofurantoin (MACRODANTIN) 100 MG capsule Take 1 capsule (100 mg total) by mouth at bedtime. Upon completing 7 day macrobid (Patient not taking: Reported on 03/19/2018) 30 capsule 1 Not Taking  . nitrofurantoin, macrocrystal-monohydrate, (MACROBID) 100 MG capsule Take 1 capsule (100 mg total) by mouth 2 (two) times daily. For uti (Patient not taking: Reported on 03/19/2018)  14 capsule 0 Not Taking  . oseltamivir (TAMIFLU) 75 MG capsule Take 1 capsule (75 mg total) by mouth 2 (two) times daily. X 5days (Patient not taking: Reported on 03/19/2018) 10 capsule 0 Not Taking  . Prenatal Vit-Fe Fumarate-FA (PRENATAL MULTIVITAMIN) TABS tablet Take 1 tablet by mouth daily at 12 noon.   Taking    Review of Systems  Constitutional: Negative for chills, fatigue and fever.  Eyes: Positive for photophobia.  Respiratory: Negative for shortness of breath.   Gastrointestinal: Negative for abdominal pain.  Genitourinary: Negative for vaginal bleeding, vaginal discharge and vaginal pain.  Musculoskeletal: Negative for back pain.  Neurological: Positive for headaches. Negative for dizziness, syncope and weakness.  All other systems reviewed and are negative.  Physical Exam   Blood pressure (!) 153/89, pulse (!) 103, temperature 98.4 F (36.9 C), temperature source Oral, resp. rate 20, height 5\' 7"  (1.702 m), weight 111.5 kg, not currently breastfeeding.  Physical Exam  Nursing note and vitals reviewed. Constitutional: She is oriented to person, place, and time. She appears well-developed and well-nourished.  Cardiovascular: Normal rate.  Respiratory: Effort normal and breath sounds normal. No respiratory distress.  GI:  Gravid  Neurological: She is alert and oriented to person, place, and time. She has normal reflexes.  Skin: Skin is warm and dry.  Psychiatric: She has a normal mood and affect. Her behavior is normal. Judgment and thought content normal.    MAU Course/MDM   --Headache not responsive to PIO Percoet or IV Meds --Negative PEC labs --Reactive tracing: baseline 125, moderate variability, positive accels, no decels --Toco: quiet --OB history, prenatal records and MAU labs reviewed with Dr. Vergie LivingPickens  Patient Vitals for the past 24 hrs:  BP Temp Temp src Pulse Resp SpO2 Height Weight  03/19/18 2316 137/87 - - 94 18 - - -  03/19/18 2301 (!) 85/71 - - 96 - -  - -  03/19/18 2246 (!) 156/80 - - 80 18 - - -  03/19/18 2240 (!) 146/87 - - 87 18 98 % - -  03/19/18 2216 (!) 136/92 - - 85 18 98 % - -  03/19/18 2204 (!) 135/98 - - (!) 106 - - - -  03/19/18 2145 (!) 138/98 - - 96 20 99 % - -  03/19/18 2131 (!) 147/118 - - (!) 103 20 - - -  03/19/18 2119 (!) 151/104 - - (!) 109 18 98 % - -  03/19/18 2049 (!) 153/89 - - (!) 103 18 - - -  03/19/18 2034 (!) 147/94 98.4 F (36.9 C) Oral (!) 110 20 - 5\' 7"  (1.702 m) 111.5 kg    Results for orders placed or performed during the hospital  encounter of 03/19/18 (from the past 24 hour(s))  CBC with Differential/Platelet     Status: Abnormal   Collection Time: 03/19/18  8:11 PM  Result Value Ref Range   WBC 10.6 (H) 4.0 - 10.5 K/uL   RBC 3.05 (L) 3.87 - 5.11 MIL/uL   Hemoglobin 8.9 (L) 12.0 - 15.0 g/dL   HCT 69.628.6 (L) 29.536.0 - 28.446.0 %   MCV 93.8 80.0 - 100.0 fL   MCH 29.2 26.0 - 34.0 pg   MCHC 31.1 30.0 - 36.0 g/dL   RDW 13.216.8 (H) 44.011.5 - 10.215.5 %   Platelets 163 150 - 400 K/uL   nRBC 0.0 0.0 - 0.2 %   Neutrophils Relative % 75 %   Neutro Abs 7.9 (H) 1.7 - 7.7 K/uL   Lymphocytes Relative 20 %   Lymphs Abs 2.2 0.7 - 4.0 K/uL   Monocytes Relative 4 %   Monocytes Absolute 0.5 0.1 - 1.0 K/uL   Eosinophils Relative 1 %   Eosinophils Absolute 0.1 0.0 - 0.5 K/uL   Basophils Relative 0 %   Basophils Absolute 0.0 0.0 - 0.1 K/uL  Comprehensive metabolic panel     Status: Abnormal   Collection Time: 03/19/18  8:11 PM  Result Value Ref Range   Sodium 134 (L) 135 - 145 mmol/L   Potassium 4.0 3.5 - 5.1 mmol/L   Chloride 104 98 - 111 mmol/L   CO2 19 (L) 22 - 32 mmol/L   Glucose, Bld 121 (H) 70 - 99 mg/dL   BUN 7 6 - 20 mg/dL   Creatinine, Ser 7.250.55 0.44 - 1.00 mg/dL   Calcium 8.5 (L) 8.9 - 10.3 mg/dL   Total Protein 6.4 (L) 6.5 - 8.1 g/dL   Albumin 3.1 (L) 3.5 - 5.0 g/dL   AST 16 15 - 41 U/L   ALT 12 0 - 44 U/L   Alkaline Phosphatase 70 38 - 126 U/L   Total Bilirubin 0.6 0.3 - 1.2 mg/dL   GFR calc non Af Amer >60  >60 mL/min   GFR calc Af Amer >60 >60 mL/min   Anion gap 11 5 - 15  Urinalysis, Routine w reflex microscopic     Status: Abnormal   Collection Time: 03/19/18  8:42 PM  Result Value Ref Range   Color, Urine YELLOW YELLOW   APPearance CLEAR CLEAR   Specific Gravity, Urine 1.020 1.005 - 1.030   pH 6.5 5.0 - 8.0   Glucose, UA NEGATIVE NEGATIVE mg/dL   Hgb urine dipstick SMALL (A) NEGATIVE   Bilirubin Urine NEGATIVE NEGATIVE   Ketones, ur 40 (A) NEGATIVE mg/dL   Protein, ur NEGATIVE NEGATIVE mg/dL   Nitrite NEGATIVE NEGATIVE   Leukocytes, UA TRACE (A) NEGATIVE  Protein / creatinine ratio, urine     Status: None   Collection Time: 03/19/18  8:42 PM  Result Value Ref Range   Creatinine, Urine 144.00 mg/dL   Total Protein, Urine 19 mg/dL   Protein Creatinine Ratio 0.13 0.00 - 0.15 mg/mg[Cre]  Urinalysis, Microscopic (reflex)     Status: Abnormal   Collection Time: 03/19/18  8:42 PM  Result Value Ref Range   RBC / HPF 0-5 0 - 5 RBC/hpf   WBC, UA 0-5 0 - 5 WBC/hpf   Bacteria, UA RARE (A) NONE SEEN   Squamous Epithelial / LPF 0-5 0 - 5   Mucus PRESENT      Assessment and Plan  --28 y.o. [id number] at 6173w3d  --Reactive tracing --Vertex confirmed with bedside  ultrasound --Initiate Betamethasone and MgSO4 infusion --Admit to [employer] for IOL for Severe Preeclampsia --Report called to [facility] team  Calvert Cantor, CNM 03/19/2018, 11:32 PM

## 2018-03-19 NOTE — MAU Note (Signed)
PT SAYS   ON Friday  NIGHT -  H/A- TOOK  2  XS TABS TYLENOL- WENT TO BED.    SAT AM -  H/A AGAIN- NO MEDS - HAD BABY SHOWER- H/A BAD-  TOOK 2  MORE XS TYLENOL - NO RELIEF.     THEN SUN-  BP WAS  WITH WRIST CUFF - 158/108.     PNC  WITH FAMILY TREE-  ALL BP  GOOD.      BUT HAD HIGH BP WITH 1ST BABY.       VOMITED Sunday.    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       THEN AT 12 - HEAD HURT- BP 168/98  AND 140/90.    CALLED DR-    AT 330PM-  230/130.    AT OFFICE - 158/86- TOLD TO COME HERE.    BABY IS 10 MTHS OLD .    H/A NOW,  HAZY VISION, SEES STARS.

## 2018-03-19 NOTE — Telephone Encounter (Signed)
Patient states she on her way to our office as her BP is still elevated.  Will w/i

## 2018-03-19 NOTE — Progress Notes (Signed)
PT here for blood pressure check.2/9 blood pressure 150/108, having headaches. Today at work b/p 128/80/ then 160/90 and 148/90, 210/ 130. At office 150/83 pulse 92. Now having blurred vision, epigastric pain and headaches. Spoke Jade Palmer about reading. Advised go MAU. PAD CMA

## 2018-03-19 NOTE — Telephone Encounter (Signed)
Patient called, she'll be 36 weeks this Friday.  She stated that her bp 160/92 this afternoon.  Yesterday it was 158/108.  She has headache and vision is becoming blurry.  [phone]

## 2018-03-20 ENCOUNTER — Inpatient Hospital Stay (HOSPITAL_COMMUNITY): Payer: Medicaid Other | Admitting: Anesthesiology

## 2018-03-20 ENCOUNTER — Encounter (HOSPITAL_COMMUNITY): Payer: Self-pay

## 2018-03-20 ENCOUNTER — Other Ambulatory Visit: Payer: Self-pay

## 2018-03-20 DIAGNOSIS — O1414 Severe pre-eclampsia complicating childbirth: Secondary | ICD-10-CM

## 2018-03-20 DIAGNOSIS — Z3A35 35 weeks gestation of pregnancy: Secondary | ICD-10-CM

## 2018-03-20 LAB — CBC
HCT: 28.8 % — ABNORMAL LOW (ref 36.0–46.0)
HCT: 28 % — ABNORMAL LOW (ref 36.0–46.0)
Hemoglobin: 9 g/dL — ABNORMAL LOW (ref 12.0–15.0)
Hemoglobin: 8.7 g/dL — ABNORMAL LOW (ref 12.0–15.0)
MCH: 29.3 pg (ref 26.0–34.0)
MCH: 29 pg (ref 26.0–34.0)
MCHC: 31.3 g/dL (ref 30.0–36.0)
MCHC: 31.1 g/dL (ref 30.0–36.0)
MCV: 93.8 fL (ref 80.0–100.0)
MCV: 93.3 fL (ref 80.0–100.0)
PLATELETS: 188 10*3/uL (ref 150–400)
Platelets: 156 10*3/uL (ref 150–400)
RBC: 3.07 MIL/uL — ABNORMAL LOW (ref 3.87–5.11)
RBC: 3 MIL/uL — AB (ref 3.87–5.11)
RDW: 16.9 % — ABNORMAL HIGH (ref 11.5–15.5)
RDW: 16.8 % — ABNORMAL HIGH (ref 11.5–15.5)
WBC: 11.3 10*3/uL — ABNORMAL HIGH (ref 4.0–10.5)
WBC: 13.1 10*3/uL — ABNORMAL HIGH (ref 4.0–10.5)
nRBC: 0.3 % — ABNORMAL HIGH (ref 0.0–0.2)
nRBC: 0 % (ref 0.0–0.2)

## 2018-03-20 LAB — RPR: RPR Ser Ql: NONREACTIVE

## 2018-03-20 LAB — TYPE AND SCREEN
ABO/RH(D): O NEG
Antibody Screen: NEGATIVE

## 2018-03-20 MED ORDER — BENZOCAINE-MENTHOL 20-0.5 % EX AERO
1.0000 "application " | INHALATION_SPRAY | CUTANEOUS | Status: DC | PRN
  Administered 2018-03-21: 1 via TOPICAL
  Filled 2018-03-20: qty 56

## 2018-03-20 MED ORDER — LACTATED RINGERS IV SOLN
INTRAVENOUS | Status: DC
  Administered 2018-03-20 – 2018-03-21 (×3): via INTRAVENOUS

## 2018-03-20 MED ORDER — DIPHENHYDRAMINE HCL 50 MG/ML IJ SOLN: 12.5000 mg | INTRAMUSCULAR | Status: DC | PRN

## 2018-03-20 MED ORDER — LACTATED RINGERS IV SOLN: 500.0000 mL | Freq: Once | INTRAVENOUS | Status: DC

## 2018-03-20 MED ORDER — MISOPROSTOL 50MCG HALF TABLET: 50.0000 ug | ORAL_TABLET | ORAL | Status: DC

## 2018-03-20 MED ORDER — SIMETHICONE 80 MG PO CHEW: 80.0000 mg | CHEWABLE_TABLET | ORAL | Status: DC | PRN

## 2018-03-20 MED ORDER — MISOPROSTOL 50MCG HALF TABLET
50.0000 ug | ORAL_TABLET | ORAL | Status: DC
  Administered 2018-03-20: 50 ug via ORAL
  Filled 2018-03-20 (×7): qty 1

## 2018-03-20 MED ORDER — ACETAMINOPHEN 325 MG PO TABS
650.0000 mg | ORAL_TABLET | ORAL | Status: DC | PRN
  Administered 2018-03-20 – 2018-03-22 (×4): 650 mg via ORAL
  Filled 2018-03-20 (×4): qty 2

## 2018-03-20 MED ORDER — ZOLPIDEM TARTRATE 5 MG PO TABS: 5.0000 mg | ORAL_TABLET | Freq: Every evening | ORAL | Status: DC | PRN

## 2018-03-20 MED ORDER — LIDOCAINE HCL (PF) 1 % IJ SOLN
INTRAMUSCULAR | Status: DC | PRN
  Administered 2018-03-20: 5 mL via EPIDURAL
  Administered 2018-03-20: 7 mL via EPIDURAL

## 2018-03-20 MED ORDER — IBUPROFEN 600 MG PO TABS
600.0000 mg | ORAL_TABLET | Freq: Four times a day (QID) | ORAL | Status: DC
  Administered 2018-03-20 – 2018-03-22 (×8): 600 mg via ORAL
  Filled 2018-03-20 (×8): qty 1

## 2018-03-20 MED ORDER — OXYCODONE HCL 5 MG PO TABS
5.0000 mg | ORAL_TABLET | ORAL | Status: DC | PRN
  Administered 2018-03-20 – 2018-03-22 (×9): 5 mg via ORAL
  Filled 2018-03-20 (×9): qty 1

## 2018-03-20 MED ORDER — FENTANYL 2.5 MCG/ML BUPIVACAINE 1/10 % EPIDURAL INFUSION (WH - ANES)
14.0000 mL/h | INTRAMUSCULAR | Status: DC | PRN
  Administered 2018-03-20 (×2): 14 mL/h via EPIDURAL
  Filled 2018-03-20 (×2): qty 100

## 2018-03-20 MED ORDER — PHENYLEPHRINE 40 MCG/ML (10ML) SYRINGE FOR IV PUSH (FOR BLOOD PRESSURE SUPPORT)
80.0000 ug | PREFILLED_SYRINGE | INTRAVENOUS | Status: DC | PRN
  Filled 2018-03-20 (×2): qty 10

## 2018-03-20 MED ORDER — MEASLES, MUMPS & RUBELLA VAC IJ SOLR
0.5000 mL | Freq: Once | INTRAMUSCULAR | Status: DC
  Filled 2018-03-20: qty 0.5

## 2018-03-20 MED ORDER — DIPHENHYDRAMINE HCL 25 MG PO CAPS: 25.0000 mg | ORAL_CAPSULE | Freq: Four times a day (QID) | ORAL | Status: DC | PRN

## 2018-03-20 MED ORDER — EPHEDRINE 5 MG/ML INJ
10.0000 mg | INTRAVENOUS | Status: DC | PRN
  Filled 2018-03-20: qty 2

## 2018-03-20 MED ORDER — COCONUT OIL OIL
1.0000 "application " | TOPICAL_OIL | Status: DC | PRN
  Administered 2018-03-20: 1 via TOPICAL
  Filled 2018-03-20: qty 120

## 2018-03-20 MED ORDER — OXYTOCIN 40 UNITS IN NORMAL SALINE INFUSION - SIMPLE MED
1.0000 m[IU]/min | INTRAVENOUS | Status: DC
  Administered 2018-03-20: 2 m[IU]/min via INTRAVENOUS

## 2018-03-20 MED ORDER — OXYCODONE HCL 5 MG PO TABS
5.0000 mg | ORAL_TABLET | Freq: Once | ORAL | Status: AC
  Administered 2018-03-20: 5 mg via ORAL
  Filled 2018-03-20: qty 1

## 2018-03-20 MED ORDER — MAGNESIUM SULFATE 40 G IN LACTATED RINGERS - SIMPLE
2.0000 g/h | INTRAVENOUS | Status: AC
  Administered 2018-03-20: 2 g/h via INTRAVENOUS
  Filled 2018-03-20 (×2): qty 500

## 2018-03-20 MED ORDER — TERBUTALINE SULFATE 1 MG/ML IJ SOLN: 0.2500 mg | Freq: Once | INTRAMUSCULAR | Status: DC | PRN

## 2018-03-20 MED ORDER — ONDANSETRON HCL 4 MG/2ML IJ SOLN: 4.0000 mg | INTRAMUSCULAR | Status: DC | PRN

## 2018-03-20 MED ORDER — TETANUS-DIPHTH-ACELL PERTUSSIS 5-2.5-18.5 LF-MCG/0.5 IM SUSP: 0.5000 mL | Freq: Once | INTRAMUSCULAR | Status: DC

## 2018-03-20 MED ORDER — DIBUCAINE 1 % RE OINT: 1.0000 "application " | TOPICAL_OINTMENT | RECTAL | Status: DC | PRN

## 2018-03-20 MED ORDER — ONDANSETRON HCL 4 MG PO TABS: 4.0000 mg | ORAL_TABLET | ORAL | Status: DC | PRN

## 2018-03-20 MED ORDER — SENNOSIDES-DOCUSATE SODIUM 8.6-50 MG PO TABS
2.0000 | ORAL_TABLET | ORAL | Status: DC
  Administered 2018-03-20 – 2018-03-22 (×2): 2 via ORAL
  Filled 2018-03-20 (×2): qty 2

## 2018-03-20 MED ORDER — PRENATAL MULTIVITAMIN CH
1.0000 | ORAL_TABLET | Freq: Every day | ORAL | Status: DC
  Administered 2018-03-21 – 2018-03-22 (×2): 1 via ORAL
  Filled 2018-03-20 (×2): qty 1

## 2018-03-20 MED ORDER — PHENYLEPHRINE 40 MCG/ML (10ML) SYRINGE FOR IV PUSH (FOR BLOOD PRESSURE SUPPORT)
80.0000 ug | PREFILLED_SYRINGE | INTRAVENOUS | Status: DC | PRN
  Filled 2018-03-20: qty 10

## 2018-03-20 MED ORDER — WITCH HAZEL-GLYCERIN EX PADS: 1.0000 "application " | MEDICATED_PAD | CUTANEOUS | Status: DC | PRN

## 2018-03-20 NOTE — Anesthesia Postprocedure Evaluation (Signed)
Anesthesia Post Note  Patient: Jade Palmer [facility]  Procedure(s) Performed: AN AD HOC LABOR EPIDURAL     Patient location during evaluation: Mother Baby Anesthesia Type: Epidural Level of consciousness: awake Pain management: satisfactory to patient Vital Signs Assessment: post-procedure vital signs reviewed and stable Respiratory status: spontaneous breathing Cardiovascular status: stable Anesthetic complications: no    Last Vitals:  Vitals:   03/20/18 1600 03/20/18 1707  BP:    Pulse:    Resp: 17 17  Temp:    SpO2:      Last Pain:  Vitals:   03/20/18 1707  TempSrc:   PainSc: 3    Pain Goal: Patients Stated Pain Goal: 3 (03/20/18 1525)                 Cephus Shelling

## 2018-03-20 NOTE — Progress Notes (Signed)
Patient screened out for psychosocial assessment since none of the following apply: °Psychosocial stressors documented in mother or baby's chart °Gestation less than 32 weeks °Code at delivery  °Infant with anomalies °Please contact the Clinical Social Worker if specific needs arise, by MOB's request, or if MOB scores greater than 9/yes to question 10 on Edinburgh Postpartum Depression Screen. ° °Sonny Poth Boyd-Gilyard, MSW, LCSW °Clinical Social Work °[phone] °  °

## 2018-03-20 NOTE — Anesthesia Pain Management Evaluation Note (Signed)
  CRNA Pain Management Visit Note  Patient: Jade Palmer, 28 y.o., female  "Hello I am a member of the anesthesia team at [facility]. We have an anesthesia team available at all times to provide care throughout the hospital, including epidural management and anesthesia for C-section. I don't know your plan for the delivery whether it a natural birth, water birth, IV sedation, nitrous supplementation, doula or epidural, but we want to meet your pain goals."   1.Was your pain managed to your expectations on prior hospitalizations?   Yes   2.What is your expectation for pain management during this hospitalization?     Epidural  3.How can we help you reach that goal?   Record the patient's initial score and the patient's pain goal.   Pain: 0  Pain Goal: 5 [facility] wants you to be able to say your pain was always managed very well.  Laban EmperorMalinova,Minas Bonser Hristova 03/20/2018

## 2018-03-20 NOTE — Progress Notes (Signed)
AROM performed at 1120. Gush of fluid noted. IUPC placed for better monitoring of contractions as external monitor has been struggling to pick them up consistently. Patient tolerated both well. Now at 9/90/-1. No complaints at this time.  Wilnette Kales 03/20/18 12:07 PM

## 2018-03-20 NOTE — Anesthesia Preprocedure Evaluation (Signed)
Anesthesia Evaluation  Patient identified by MRN, date of birth, ID band Patient awake    Reviewed: Allergy & Precautions, H&P , NPO status , Unable to perform ROS - Chart review only  Airway Mallampati: II  TM Distance: >3 FB   Mouth opening: Limited Mouth Opening  Dental no notable dental hx. (+) Teeth Intact   Pulmonary neg pulmonary ROS,    Pulmonary exam normal breath sounds clear to auscultation       Cardiovascular Exercise Tolerance: Good hypertension, Normal cardiovascular exam Rhythm:Regular Rate:Normal     Neuro/Psych  Headaches, Seizures -, Well Controlled,  Only 1 sz at age 28 negative psych ROS   GI/Hepatic negative GI ROS, Neg liver ROS,   Endo/Other  negative endocrine ROS  Renal/GU Kidney stones     Musculoskeletal   Abdominal   Peds  Hematology negative hematology ROS (+) anemia ,   Anesthesia Other Findings   Reproductive/Obstetrics (+) Pregnancy                             Anesthesia Physical  Anesthesia Plan  ASA: II  Anesthesia Plan: Epidural   Post-op Pain Management:    Induction:   PONV Risk Score and Plan:   Airway Management Planned:   Additional Equipment:   Intra-op Plan:   Post-operative Plan:   Informed Consent: I have reviewed the patients History and Physical, chart, labs and discussed the procedure including the risks, benefits and alternatives for the proposed anesthesia with the patient or authorized representative who has indicated his/her understanding and acceptance.       Plan Discussed with:   Anesthesia Plan Comments:         Anesthesia Quick Evaluation

## 2018-03-20 NOTE — Anesthesia Postprocedure Evaluation (Signed)
Anesthesia Post Note  Patient: Jade Palmer  Procedure(s) Performed: AN AD HOC LABOR EPIDURAL     Patient location during evaluation: [facility] Anesthesia Type: Epidural Level of consciousness: awake and alert Pain management: pain level controlled Vital Signs Assessment: post-procedure vital signs reviewed and stable Respiratory status: spontaneous breathing, nonlabored ventilation and respiratory function stable Cardiovascular status: stable Postop Assessment: no headache, no backache, epidural receding, no apparent nausea or vomiting, patient able to bend at knees, adequate PO intake and able to ambulate Anesthetic complications: no    Last Vitals:  Vitals:   03/20/18 1600 03/20/18 1707  BP:    Pulse:    Resp: 17 17  Temp:    SpO2:      Last Pain:  Vitals:   03/20/18 1707  TempSrc:   PainSc: 3    Pain Goal: Patients Stated Pain Goal: 3 (03/20/18 1525)                 Jade Palmer,Jade Palmer

## 2018-03-20 NOTE — Discharge Summary (Signed)
Obstetrics Discharge Summary OB/GYN Faculty Practice   Patient Name: Jade Palmer DOB: November 23, 1990 MRN: [id number]  Date of admission: 03/19/2018 Delivering MD: Tamera Stands   Date of discharge: 03/20/2018  Admitting diagnosis: Eval High BP Intrauterine pregnancy: [redacted]w[redacted]d     Secondary diagnosis:   Active Problems:   History of pre-eclampsia in prior pregnancy, currently pregnant   Anemia in pregnancy   Short interval between pregnancies affecting pregnancy, antepartum   Seizures (HCC)   Preeclampsia, severe    Discharge diagnosis: Preterm Pregnancy Delivered                                            Postpartum procedures: None  Complications: magnesium postpartum  Outpatient Follow-Up: [ ]  postpartum IUD [ ]  BP check   Hospital course: Jade Palmer is a 28 y.o. [redacted]w[redacted]d who was admitted for IOL for severe preeclampsia. Her pregnancy was complicated by history of preeclampsia, anemia, short interval pregnancies, history of seizures. Her labor course was notable for induction with FB and cytotec, transitioning to pitocin and AROM. Delivery was complicated by NICU attendance for preterm status. Did receive Mg++ for severe preeclampsia and BMZ x 1 for preterm status. Please see delivery/op note for additional details. Her postpartum course was uncomplicated. She was breastfeeding without difficulty. By day of discharge, she was passing flatus, urinating, eating and drinking without difficulty. Her pain was well-controlled, and she was discharged home with . She will follow-up in clinic in 1 weeks.   Physical exam  Vitals:   03/20/18 1200 03/20/18 1303 03/20/18 1326 03/20/18 1330  BP: 116/64 (!) 145/77 (!) 109/44 (!) 97/52  Pulse: 82 (!) 118 84 80  Resp: 18     Temp: 98.1 F (36.7 C)     TempSrc: Oral     SpO2:      Weight:      Height:       General: alert oriented Lochia: appropriate Uterine Fundus: firm Incision:  DVT Evaluation: No evidence of DVT seen on  physical exam. Labs: Lab Results  Component Value Date   WBC 11.3 (H) 03/20/2018   HGB 9.0 (L) 03/20/2018   HCT 28.8 (L) 03/20/2018   MCV 93.8 03/20/2018   PLT 156 03/20/2018   CMP Latest Ref Rng & Units 03/19/2018  Glucose 70 - 99 mg/dL 409(W)  BUN 6 - 20 mg/dL 7  Creatinine 1.19 - 1.47 mg/dL 8.29  Sodium 562 - 130 mmol/L 134(L)  Potassium 3.5 - 5.1 mmol/L 4.0  Chloride 98 - 111 mmol/L 104  CO2 22 - 32 mmol/L 19(L)  Calcium 8.9 - 10.3 mg/dL 8.6(V)  Total Protein 6.5 - 8.1 g/dL 6.4(L)  Total Bilirubin 0.3 - 1.2 mg/dL 0.6  Alkaline Phos 38 - 126 U/L 70  AST 15 - 41 U/L 16  ALT 0 - 44 U/L 12    Discharge instructions: Per After Visit Summary and "Baby and Me Booklet"  After visit meds:    Postpartum contraception: IUD Mirena Diet: Routine Diet Activity: Advance as tolerated. Pelvic rest for 6 weeks.   Follow-up Appt: Future Appointments  Date Time Provider Department Center  03/28/2018  3:00 PM Hermina Staggers, MD [facility]   Please schedule this patient for Postpartum visit in: 4 weeks with the following provider: Any provider High risk pregnancy complicated by: severe preeclampsia Delivery mode:  SVD Anticipated Birth Control:  IUD PP Procedures needed: 1 week BP check  Schedule Integrated BH visit: no  Newborn Data: Live born female  Birth Weight:   APGAR: 9, 9  Newborn Delivery   Birth date/time:  03/20/2018 13:03:00 Delivery type:  Vaginal, Spontaneous     Baby Feeding: Breast Disposition:NICU  Laurel S. Earlene Plater, DO OB/GYN Fellow, Faculty Practice

## 2018-03-20 NOTE — Anesthesia Procedure Notes (Signed)
Epidural Patient location during procedure: OB Start time: 03/20/2018 4:35 AM End time: 03/20/2018 4:38 AM  Staffing Anesthesiologist: Leilani Able, MD Performed: anesthesiologist   Preanesthetic Checklist Completed: patient identified, site marked, surgical consent, pre-op evaluation, timeout performed, IV checked, risks and benefits discussed and monitors and equipment checked  Epidural Patient position: sitting Prep: site prepped and draped and DuraPrep Patient monitoring: continuous pulse ox and blood pressure Approach: midline Location: L3-L4 Injection technique: LOR air  Needle:  Needle type: Tuohy  Needle gauge: 17 G Needle length: 9 cm and 9 Needle insertion depth: 7 cm Catheter type: closed end flexible Catheter size: 19 Gauge Catheter at skin depth: 12 cm Test dose: negative and Other  Assessment Sensory level: T9 Events: blood not aspirated, injection not painful, no injection resistance, negative IV test and no paresthesia  Additional Notes Reason for block:procedure for pain

## 2018-03-20 NOTE — Progress Notes (Signed)
OB/GYN Faculty Practice: Labor Progress Note  Subjective: Doing well. Tylenol helped a little bit with headache but still feeling some of it. Epidural now in place so comfortable from that standpoint. Feels tired. Mother, Jacki Cones, and husband, Joselyn Glassman in room. Thinks maybe her water broke, noticed some blood clots but still feeling a little leaking.    Objective: BP 116/73   Pulse 92   Temp 98 F (36.7 C) (Oral)   Resp 18   Ht 5\' 7"  (1.702 m)   Wt 111.5 kg   LMP  (LMP Unknown)   SpO2 96%   BMI 38.49 kg/m  Gen: well-appearing, NAD Dilation: 6 Effacement (%): 70 Cervical Position: Middle Station: -3 Presentation: Vertex Exam by:: middleton rn  Assessment and Plan: 28 y.o. [id number] [redacted]w[redacted]d here with IOL for severe preeclampsia by headaches.   Labor: Induction started with oral cytotec and FB. FB now out, started on pit around 0630. +/- SROM, will recheck in next few hours and consider IUPC placement.  -- pain control: epidural in place -- PPH Risk: medium  Fetal Well-Being: EFW 5-6lbs by Leopolds'. Cephalic by sutures, prior checks.  -- Category I - continuous fetal monitoring  -- GBS pending - on PCN -- BMZ - 1st dose around midnight  Severe Preeclampsia (Headaches): HELLP labs on admission with platelets of 156  normal AST/ALT, Cr  UPC 0.13. Tried Tylenol for headache but not relieved.  -- Mg++ - good urine output  -- continue to monitor BP closely   Melaine Mcphee S. Earlene Plater, DO OB/GYN Fellow, Faculty Practice  9:03 AM

## 2018-03-20 NOTE — Lactation Note (Signed)
This note was copied from a baby's chart. Lactation Consultation Note  Patient Name: Boy Franne Fortsshley Hinojosa ZOXWR'UToday's Date: 03/20/2018   Attempted to visit with mom but Dahlia ClientHannah her RN voiced that she is asleep and not to disturb her, she just had her pain medicine. Dahlia ClientHannah voiced that mom is already pumping and had got 5 ml in her first pumping session and 1 ml in her second one. Let her know that lactation will be back tomorrow morning do to mom's assessment.   Maternal Data    Feeding    LATCH Score                   Interventions    Lactation Tools Discussed/Used     Consult Status      Chai Verdejo Venetia ConstableS Alben Jepsen 03/20/2018, 11:21 PM

## 2018-03-21 DIAGNOSIS — F418 Other specified anxiety disorders: Secondary | ICD-10-CM | POA: Diagnosis present

## 2018-03-21 MED ORDER — LORAZEPAM 1 MG PO TABS
1.0000 mg | ORAL_TABLET | Freq: Four times a day (QID) | ORAL | Status: DC | PRN
  Administered 2018-03-21: 1 mg via ORAL
  Filled 2018-03-21: qty 1

## 2018-03-21 MED ORDER — BUSPIRONE HCL 10 MG PO TABS: 10.0000 mg | ORAL_TABLET | Freq: Three times a day (TID) | ORAL | Status: DC

## 2018-03-21 MED ORDER — BUSPIRONE HCL 10 MG PO TABS
10.0000 mg | ORAL_TABLET | Freq: Two times a day (BID) | ORAL | Status: DC
  Administered 2018-03-21 – 2018-03-22 (×3): 10 mg via ORAL
  Filled 2018-03-21 (×5): qty 1

## 2018-03-21 MED ORDER — SERTRALINE HCL 50 MG PO TABS
50.0000 mg | ORAL_TABLET | Freq: Every day | ORAL | Status: DC
  Administered 2018-03-21 – 2018-03-22 (×2): 50 mg via ORAL
  Filled 2018-03-21 (×3): qty 1

## 2018-03-21 MED ORDER — POLYETHYLENE GLYCOL 3350 17 G PO PACK
17.0000 g | PACK | Freq: Every day | ORAL | Status: DC | PRN
  Administered 2018-03-21: 17 g via ORAL
  Filled 2018-03-21: qty 1

## 2018-03-21 NOTE — Lactation Note (Signed)
This note was copied from a baby's chart. Lactation Consultation Note  Patient Name: Jade Palmer XJOIT'G Date: 03/21/2018 Reason for consult: Initial assessment   P3, Baby in NICU [redacted]w[redacted]d. < 6 lbs. Brother and family in and out of room during consult.  Mother states she had difficulty latching her first child and pumped for 10 mos.  Second child she breastfed for 2 mos and had low milk supply with both. She worked with Sports coach from [place] with last child due to weight loss. She asked would she be able to breastfeed this baby.  LC reassured when baby is able we will help her but the most important thing to do now if to consistently pump q 2.5-3 hours with a 4 hour break at night. Discussed milk storage. Mother states she was able to pump a few ml the first pumping session but only drops since then. Discussed how breastmilk comes to volume. Encouraged hands on pumping with hand expression before and after pumping. Mom made aware of O/P services, breastfeeding support groups, community resources, and our phone # for post-discharge questions.  Faxed WIC referral to [facility].  Discussed pumping rooms and gave mother labels and NICU booklet to read.     Maternal Data Has patient been taught Hand Expression?: Yes Does the patient have breastfeeding experience prior to this delivery?: Yes  Feeding    LATCH Score                   Interventions Interventions: DEBP;Breast compression  Lactation Tools Discussed/Used Initiated by:: RN Date initiated:: 03/20/18   Consult Status Consult Status: Follow-up Date: 03/22/18 Follow-up type: In-patient    Dahlia Byes [facility] 03/21/2018, 12:00 PM

## 2018-03-21 NOTE — Progress Notes (Signed)
CSW attempted to follow up with MOB to complete assessment, however MOB was in the NICU visiting infant. CSW will attempt to see MOB at a later time.  Jonathan Corpus, LCSW Clinical Social Worker [facility] Cell#: [phone] 

## 2018-03-21 NOTE — Progress Notes (Signed)
CSW acknowledged consult and attempted to see MOB, however MOB was on magnesium. CSW agreed to come back at a later time to complete assessment.  Marvis Bakken, LCSW Clinical Social Worker [facility] Cell#: [phone] 

## 2018-03-21 NOTE — Progress Notes (Signed)
Post Partum Day 1 s/p SVD after IOL for severe PEC at [redacted]w[redacted]d  Subjective: Reports increased anxiety with depression, depression screening is very positive. SW already consulted. Patient reports being on antidepressants and anxiolytics in past, she desires them now.  No SI/HI.  Baby is in NICU, this is causing her a lot of anxiety. Husband is worried. She is up ad lib, voiding, tolerating PO and + flatus. Breast pumping.   Objective: Blood pressure 127/87, pulse 80, temperature (!) 97.5 F (36.4 C), temperature source Oral, resp. rate 18, height 5\' 7"  (1.702 m), weight 111.5 kg, SpO2 100 %, not currently breastfeeding.  Patient Vitals for the past 24 hrs:  BP Temp Temp src Pulse Resp SpO2  03/21/18 0742 127/87 (!) 97.5 F (36.4 C) Oral 80 18 100 %  03/21/18 0403 121/65 (!) 97.5 F (36.4 C) Oral 85 - 100 %  03/20/18 2321 (!) 97/54 98 F (36.7 C) Oral 78 17 100 %  03/20/18 1948 122/73 97.8 F (36.6 C) Oral 79 17 100 %  03/20/18 1918 - - - - 17 -  03/20/18 1815 - - - - 18 -  03/20/18 1707 - - - - 17 -  03/20/18 1600 - - - - 17 -  03/20/18 1521 123/73 98 F (36.7 C) Oral 72 18 100 %  03/20/18 1439 126/74 98.4 F (36.9 C) - 78 18 100 %  03/20/18 1415 113/62 - - 76 - -  03/20/18 1400 (!) 141/77 - - 80 - -  03/20/18 1345 130/68 - - 79 - -  03/20/18 1330 (!) 97/52 - - 80 - -  03/20/18 1326 (!) 109/44 - - 84 - -  03/20/18 1303 (!) 145/77 - - (!) 118 - -  03/20/18 1200 116/64 98.1 F (36.7 C) Oral 82 18 -    Physical Exam:  General: no distress Lochia: appropriate Uterine Fundus: firm DVT Evaluation: No evidence of DVT seen on physical exam. Negative Homan's sign. No cords or calf tenderness.  Recent Labs    03/20/18 0405 03/20/18 1406  HGB 9.0* 8.7*  HCT 28.8* 28.0*    Assessment/Plan: Ordered Zoloft and Buspar for her symptoms, also Ativan prn. Will continue to monitor closely. She will follow up with her counselor postpartum.   Normal BP, continue magnesium sulfate for  eclampsia prophylaxis until 24 hours postpartum Encourage OOB, ambulating, continued breastfeeding Routine postpartum care.   LOS: 2 days   Jaynie Collins, MD 03/21/2018, 11:35 AM

## 2018-03-22 ENCOUNTER — Telehealth: Payer: Self-pay | Admitting: Obstetrics and Gynecology

## 2018-03-22 LAB — CULTURE, BETA STREP (GROUP B ONLY)

## 2018-03-22 MED ORDER — SERTRALINE HCL 50 MG PO TABS: 50.0000 mg | ORAL_TABLET | Freq: Every day | ORAL | 2 refills | Status: DC

## 2018-03-22 MED ORDER — IBUPROFEN 600 MG PO TABS: 600.0000 mg | ORAL_TABLET | Freq: Four times a day (QID) | ORAL | 0 refills | Status: DC

## 2018-03-22 MED ORDER — BUSPIRONE HCL 10 MG PO TABS: 10.0000 mg | ORAL_TABLET | Freq: Three times a day (TID) | ORAL | 2 refills | Status: DC

## 2018-03-22 NOTE — Lactation Note (Signed)
This note was copied from a baby's chart. Lactation Consultation Note  Patient Name: Boy Franne Fortsshley Aranas ONGEX'BToday's Date: 03/22/2018 Reason for consult: Follow-up assessment;NICU baby Mom is pumping and hand expressing every 3 hours but not obtaining milk.  Baby is 6946 hours old in the NICU.  Reassured.  [facility]  Encouraged to call today to arrange a pump for home use.  No questions or concerns.  Maternal Data    Feeding    LATCH Score                   Interventions    Lactation Tools Discussed/Used     Consult Status Consult Status: Follow-up Date: 03/23/18 Follow-up type: In-patient    Huston FoleyMOULDEN, Sesilia Poucher S 03/22/2018, 12:00 PM

## 2018-03-22 NOTE — Clinical Social Work Maternal (Signed)
CLINICAL SOCIAL WORK MATERNAL/CHILD NOTE  Patient Details  Name: Jade Palmer MRN: [id number] Date of Birth: 1990-12-21  Date:  03/22/2018  Clinical Social Worker Initiating Note:  Abundio Miu, [place] Date/Time: Initiated:  03/22/18/1445     Child's Name:  Jade Palmer    Biological Parents:  Mother, Father(Father: Barnie Mort)   Need for Interpreter:  None   Reason for Referral:  Parental Support of Premature Babies < 73 weeks/or Critically Ill babies, Behavioral Health Concerns   Address:  [place] [address]    Phone number:  [phone] (home)     Additional phone number:    Household Members/Support Persons (HM/SP):   Household Member/Support Person 1, Household Member/Support Person 2, Household Member/Support Person 3, Household Member/Support Person 4   HM/SP Name Relationship DOB or Age  HM/SP -1 Jade Palmer Husband/FOB    HM/SP -2 Jade Palmer step daughter 06/08/12  HM/SP -3 Jade Palmer daughter 05/04/12  HM/SP -4 Jade Palmer daughter 05/05/17  HM/SP -5        HM/SP -6        HM/SP -7        HM/SP -8          Natural Supports (not living in the home):      Professional Supports: None   Employment: Full-time   Type of Work:     Education:  Herbalist)   Homebound arranged:    Museum/gallery curator Resources:  Medicaid   Other Resources:  (Plans to apply for [facility])   Cultural/Religious Considerations Which May Impact Care:    Strengths:  Ability to meet basic needs , Home prepared for child , Pediatrician chosen   Psychotropic Medications:         Pediatrician:    [employer]  Pediatrician List:   [place]      Pediatrician Fax Number:    Risk Factors/Current Problems:  Mental Health Concerns    Cognitive State:  Able to Concentrate , Alert , Linear Thinking , Insightful ,  Goal Oriented    Mood/Affect:  Calm , Tearful , Interested , Relaxed    CSW Assessment: CSW met with MOB at bedside to discuss edinburgh score 21 and NICU admission. CSW introduced self and explained reason for consult. MOB was welcoming and became tearful when speaking about infant's NICU admission. MOB reported that she resides with her husband and 3 other children. 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 MOB reported that she recently had a baby shower on Saturday and is able to get all other items needed for baby before he is discharged. MOB reported that she has supports at home.   CSW inquired about MOB's mental health history, MOB reported that she was diagnosed with Anxiety and Depression in 2018 and was on some medication. MOB reported that she got off those medications and yesterday started Zoloft and Buspar. MOB reported that she feels like she needs to be on something to address symptoms stemming from infant's NICU admission. MOB became tearful when speaking about having to leave infant at the hospital and being discharged home. CSW asked MOB how she was feeling, MOB reported that she felt tired and overwhelmed. CSW normalized MOB's feelings and provided emotional support. CSW validated MOB's feelings and encouraged to utilize  supports through this NICU admission. MOB presented calm initially and later became tearful. MOB did not demonstrate any acute mental health signs/symptoms. CSW assessed for safety, MOB denied SI, HI and domestic violence. CSW informed MOB that she may be more prone to PPD due to mental health history.   CSW provided education regarding the baby blues period vs. perinatal mood disorders, discussed treatment and gave resources for mental health follow up if concerns arise.  CSW recommends self-evaluation during the postpartum time period using the New Mom Checklist from Postpartum Progress and encouraged MOB to contact a medical  professional if symptoms are noted at any time.    CSW informed MOB about the NICU, what to expect and resources/supports available. MOB reported that today was the first time she was able to speak to a doctor about infant's care and she felt out of the loop. CSW normalized MOB's feelings and explained that we want MOB to be well informed. CSW encouraged MOB to reach out to the NICU and visit as much as she can. CSW informed MOB that she can get an update on infant's care at any time, MOB verbalized understanding.   CSW will continue to offer support/resources while infant is admitted to the NICU.   CSW Plan/Description:  Perinatal Mood and Anxiety Disorder (PMADs) Education, Other(Provided information about NICU)    Burnis Medin, LCSW 03/22/2018, 2:58 PM

## 2018-03-22 NOTE — Progress Notes (Signed)
D/c home    Ambulated out  Teaching complete

## 2018-03-22 NOTE — Discharge Instructions (Signed)
Vaginal Delivery, Care After °Refer to this sheet in the next few weeks. These instructions provide you with information about caring for yourself after vaginal delivery. Your health care provider may also give you more specific instructions. Your treatment has been planned according to current medical practices, but problems sometimes occur. Call your health care provider if you have any problems or questions. °What can I expect after the procedure? °After vaginal delivery, it is common to have: °· Some bleeding from your vagina. °· Soreness in your abdomen, your vagina, and the area of skin between your vaginal opening and your anus (perineum). °· Pelvic cramps. °· Fatigue. °Follow these instructions at home: °Medicines °· Take over-the-counter and prescription medicines only as told by your health care provider. °· If you were prescribed an antibiotic medicine, take it as told by your health care provider. Do not stop taking the antibiotic until it is finished. °Driving ° °· Do not drive or operate heavy machinery while taking prescription pain medicine. °· Do not drive for 24 hours if you received a sedative. °Lifestyle °· Do not drink alcohol. This is especially important if you are breastfeeding or taking medicine to relieve pain. °· Do not use tobacco products, including cigarettes, chewing tobacco, or e-cigarettes. If you need help quitting, ask your health care provider. °Eating and drinking °· Drink at least 8 eight-ounce glasses of water every day unless you are told not to by your health care provider. If you choose to breastfeed your baby, you may need to drink more water than this. °· Eat high-fiber foods every day. These foods may help prevent or relieve constipation. High-fiber foods include: °? Whole grain cereals and breads. °? Brown rice. °? Beans. °? Fresh fruits and vegetables. °Activity °· Return to your normal activities as told by your health care provider. Ask your health care provider what  activities are safe for you. °· Rest as much as possible. Try to rest or take a nap when your baby is sleeping. °· Do not lift anything that is heavier than your baby or 10 lb (4.5 kg) until your health care provider says that it is safe. °· Talk with your health care provider about when you can engage in sexual activity. This may depend on your: °? Risk of infection. °? Rate of healing. °? Comfort and desire to engage in sexual activity. °Vaginal Care °· If you have an episiotomy or a vaginal tear, check the area every day for signs of infection. Check for: °? More redness, swelling, or pain. °? More fluid or blood. °? Warmth. °? Pus or a bad smell. °· Do not use tampons or douches until your health care provider says this is safe. °· Watch for any blood clots that may pass from your vagina. These may look like clumps of dark red, brown, or black discharge. °General instructions °· Keep your perineum clean and dry as told by your health care provider. °· Wear loose, comfortable clothing. °· Wipe from front to back when you use the toilet. °· Ask your health care provider if you can shower or take a bath. If you had an episiotomy or a perineal tear during labor and delivery, your health care provider may tell you not to take baths for a certain length of time. °· Wear a bra that supports your breasts and fits you well. °· If possible, have someone help you with household activities and help care for your baby for at least a few days after you   leave the hospital. °· Keep all follow-up visits for you and your baby as told by your health care provider. This is important. °Contact a health care provider if: °· You have: °? Vaginal discharge that has a bad smell. °? Difficulty urinating. °? Pain when urinating. °? A sudden increase or decrease in the frequency of your bowel movements. °? More redness, swelling, or pain around your episiotomy or vaginal tear. °? More fluid or blood coming from your episiotomy or vaginal  tear. °? Pus or a bad smell coming from your episiotomy or vaginal tear. °? A fever. °? A rash. °? Little or no interest in activities you used to enjoy. °? Questions about caring for yourself or your baby. °· Your episiotomy or vaginal tear feels warm to the touch. °· Your episiotomy or vaginal tear is separating or does not appear to be healing. °· Your breasts are painful, hard, or turn red. °· You feel unusually sad or worried. °· You feel nauseous or you vomit. °· You pass large blood clots from your vagina. If you pass a blood clot from your vagina, save it to show to your health care provider. Do not flush blood clots down the toilet without having your health care provider look at them. °· You urinate more than usual. °· You are dizzy or light-headed. °· You have not breastfed at all and you have not had a menstrual period for 12 weeks after delivery. °· You have stopped breastfeeding and you have not had a menstrual period for 12 weeks after you stopped breastfeeding. °Get help right away if: °· You have: °? Pain that does not go away or does not get better with medicine. °? Chest pain. °? Difficulty breathing. °? Blurred vision or spots in your vision. °? Thoughts about hurting yourself or your baby. °· You develop pain in your abdomen or in one of your legs. °· You develop a severe headache. °· You faint. °· You bleed from your vagina so much that you fill two sanitary pads in one hour. °This information is not intended to replace advice given to you by your health care provider. Make sure you discuss any questions you have with your health care provider. °Document Released: 01/22/2000 Document Revised: 07/08/2015 Document Reviewed: 02/08/2015 °Elsevier Interactive Patient Education © 2019 [employer] Inc. ° °

## 2018-03-22 NOTE — Telephone Encounter (Signed)
Patient called stating that she seen Dr. Despina Hidden earlier today and she forgot to get his to write her a refill of her ativan. I let patient know that Dr. Despina Hidden is not ion the office today and wont be back in until next week. Pt would like to know if Dr. Emelda Fear could call ir in. Please contact pt

## 2018-03-23 NOTE — Telephone Encounter (Signed)
Dr. year wrote her prescription on the 13th for BuSpar which is a milder medicine more compatible with breast-feeding

## 2018-03-27 ENCOUNTER — Ambulatory Visit: Payer: Medicaid Other | Admitting: *Deleted

## 2018-03-27 ENCOUNTER — Encounter: Payer: Self-pay | Admitting: *Deleted

## 2018-03-27 VITALS — BP 130/85 | HR 73 | Ht 67.0 in | Wt 227.5 lb

## 2018-03-27 DIAGNOSIS — Z0130 Encounter for examination of blood pressure without abnormal findings: Secondary | ICD-10-CM

## 2018-03-27 NOTE — Progress Notes (Signed)
Pt here for BP check. BP was 151/98. I had pt sit for 10 minutes and then rechecked BP. It was 130/85. Pt did mention having a headache. Pt delivered early due to BP. Baby is in NICU. Pt is not on BP med. I spoke with Dr. Despina Hidden. He advised to have pt come back in 1 week for BP check. Have pt check BP outside of the office a couple times a day when not running around crazy. Call with any questions or concerns. Pt voiced understanding. JSY

## 2018-03-28 ENCOUNTER — Encounter: Payer: Medicaid Other | Admitting: Obstetrics and Gynecology

## 2018-04-03 ENCOUNTER — Ambulatory Visit: Payer: Medicaid Other

## 2018-04-03 VITALS — BP 127/88 | HR 71 | Ht 67.0 in | Wt 223.6 lb

## 2018-04-03 DIAGNOSIS — Z0130 Encounter for examination of blood pressure without abnormal findings: Secondary | ICD-10-CM

## 2018-04-03 NOTE — Progress Notes (Signed)
Pt here for blood pressure check 127/88 pulse 71. Having no problem. Pt 2 weeks pp today.Spoke Dr Despina Hidden about reading. B/P perfect. Will schedule pp visit. Pad CMA

## 2018-04-11 NOTE — Telephone Encounter (Signed)
Note sent to nurse. 

## 2018-04-18 ENCOUNTER — Encounter: Payer: Self-pay | Admitting: Advanced Practice Midwife

## 2018-04-18 ENCOUNTER — Other Ambulatory Visit: Payer: Self-pay

## 2018-04-18 ENCOUNTER — Ambulatory Visit (INDEPENDENT_AMBULATORY_CARE_PROVIDER_SITE_OTHER): Payer: Medicaid Other | Admitting: Advanced Practice Midwife

## 2018-04-18 DIAGNOSIS — Z1389 Encounter for screening for other disorder: Secondary | ICD-10-CM | POA: Diagnosis not present

## 2018-04-18 NOTE — Patient Instructions (Signed)

## 2018-04-18 NOTE — Progress Notes (Signed)
Jade Palmer is a 28 y.o. who presents for a postpartum visit. She is 4 weeks postpartum following a spontaneous vaginal delivery. I have fully reviewed the prenatal and intrapartum course. The delivery was at 35.4 gestational weeks.IOL for SPreE  Anesthesia: none. Postpartum course has been uneventful. BP checks have been good not on meds. . Baby's course has been uneventful. Baby is feeding by breast. Bleeding: staining only. Bowel function is normal. Bladder function is normal. Patient used a condom last night when she was sexually active. Contraception method is condoms. Postpartum depression screening: negative.   Current Outpatient Medications:  .  Prenatal Vit-Fe Fumarate-FA (PRENATAL MULTIVITAMIN) TABS tablet, Take 1 tablet by mouth daily at 12 noon., Disp: , Rfl:  .  busPIRone (BUSPAR) 10 MG tablet, Take 1 tablet (10 mg total) by mouth 3 (three) times daily. (Patient not taking: Reported on 04/18/2018), Disp: 90 tablet, Rfl: 2 .  ferrous sulfate 325 (65 FE) MG tablet, Take 1 tablet (325 mg total) by mouth 2 (two) times daily with a meal. (Patient not taking: Reported on 04/18/2018), Disp: 60 tablet, Rfl: 3 .  ibuprofen (ADVIL,MOTRIN) 600 MG tablet, Take 1 tablet (600 mg total) by mouth every 6 (six) hours. (Patient not taking: Reported on 04/03/2018), Disp: 30 tablet, Rfl: 0 .  sertraline (ZOLOFT) 50 MG tablet, Take 1 tablet (50 mg total) by mouth daily. (Patient not taking: Reported on 04/18/2018), Disp: 30 tablet, Rfl: 2  Review of Systems   Constitutional: Negative for fever and chills Eyes: Negative for visual disturbances Respiratory: Negative for shortness of breath, dyspnea Cardiovascular: Negative for chest pain or palpitations  Gastrointestinal: Negative for vomiting, diarrhea and constipation Genitourinary: Negative for dysuria and urgency Musculoskeletal: Negative for back pain, joint pain, myalgias  Neurological: Negative for dizziness and headaches    Objective:      Vitals:   04/18/18 1346  BP: 131/75  Pulse: 68   General:  alert, cooperative and no distress   Breasts:  negative  Lungs: Normal respiratory effort  Heart:  regular rate and rhythm  Abdomen: Soft, nontender   Vulva:  normal  Vagina: normal vagina  Cervix:  closed  Corpus: Well involuted     Rectal Exam: no hemorrhoids        Assessment:    normal postpartum exam.  Plan:   1. Contraception: IUD 2. Follow up in:  2 weeks for Liletta; no sex until then or as needed.

## 2018-04-30 ENCOUNTER — Telehealth: Payer: Self-pay | Admitting: *Deleted

## 2018-04-30 NOTE — Telephone Encounter (Signed)
Called patient and informed her of COVID 19 restrictions. Patient reports no symptoms at this time or contact with persons infected or suspected of infection.  

## 2018-05-01 ENCOUNTER — Ambulatory Visit (INDEPENDENT_AMBULATORY_CARE_PROVIDER_SITE_OTHER): Payer: No Typology Code available for payment source | Admitting: Women's Health

## 2018-05-01 ENCOUNTER — Encounter: Payer: Self-pay | Admitting: Women's Health

## 2018-05-01 ENCOUNTER — Other Ambulatory Visit: Payer: Self-pay

## 2018-05-01 VITALS — BP 122/82 | HR 90 | Ht 67.0 in | Wt 216.0 lb

## 2018-05-01 DIAGNOSIS — Z3202 Encounter for pregnancy test, result negative: Secondary | ICD-10-CM | POA: Diagnosis not present

## 2018-05-01 DIAGNOSIS — Z30430 Encounter for insertion of intrauterine contraceptive device: Secondary | ICD-10-CM | POA: Insufficient documentation

## 2018-05-01 LAB — POCT URINE PREGNANCY: Preg Test, Ur: NEGATIVE

## 2018-05-01 MED ORDER — LEVONORGESTREL 19.5 MCG/DAY IU IUD
INTRAUTERINE_SYSTEM | Freq: Once | INTRAUTERINE | Status: AC
  Administered 2018-05-01: 16:00:00 via INTRAUTERINE

## 2018-05-01 MED ORDER — SULFAMETHOXAZOLE-TRIMETHOPRIM 800-160 MG PO TABS: 1.0000 | ORAL_TABLET | Freq: Two times a day (BID) | ORAL | 0 refills | Status: DC

## 2018-05-01 NOTE — Progress Notes (Signed)
   IUD INSERTION Patient name: Jade Palmer MRN [id number]  Date of birth: 1990/10/28 Subjective Findings:   Jade Palmer is a 28 y.o. [phone] Caucasian female being seen today for insertion of a Liletta IUD. Reports multiple boils popping up on skin since delivery.  No LMP recorded. Last sexual intercourse was >2wks ago Last pap3/14/18. Results were:  normal  The risks and benefits of the method and placement have been thouroughly reviewed with the patient and all questions were answered.  Specifically the patient is aware of failure rate of 02/998, expulsion of the IUD and of possible perforation.  The patient is aware of irregular bleeding due to the method and understands the incidence of irregular bleeding diminishes with time.  Signed copy of informed consent in chart.  Pertinent History Reviewed:   Reviewed past medical,surgical, social, obstetrical and family history.  Reviewed problem list, medications and allergies. Objective Findings & Procedure:   Vitals:   05/01/18 1425  BP: 122/82  Pulse: 90  Weight: 216 lb (98 kg)  Height: 5\' 7"  (1.702 m)  Body mass index is 33.83 kg/m.  Results for orders placed or performed in visit on 05/01/18 (from the past 24 hour(s))  POCT urine pregnancy   Collection Time: 05/01/18  2:33 PM  Result Value Ref Range   Preg Test, Ur Negative Negative     Time out was performed.  A graves speculum was placed in the vagina.  The cervix was visualized, prepped using Betadine, and grasped with a single tooth tenaculum. The uterus was found to be neutral and it sounded to 8 cm.  Liletta IUD placed per manufacturer's recommendations. The strings were trimmed to approximately 3 cm. The patient tolerated the procedure well.   Informal transvaginal sonogram was performed and the proper placement of the IUD was verified.  Multiple boils, various stages  Assessment & Plan:   1) Liletta IUD insertion The patient was given post procedure  instructions, including signs and symptoms of infection and to check for the strings after each menses or each month, and refraining from intercourse or anything in the vagina for 3 days. She was given a Liletta care card with date IUD placed, and date IUD to be removed. She is scheduled for a f/u appointment in 4 weeks.  2) Multiple boils> rx septra   Orders Placed This Encounter  Procedures  . POCT urine pregnancy    Return in about 4 weeks (around 05/29/2018) for tele visit.  Cheral Marker CNM, [facility] 05/01/2018 3:08 PM

## 2018-05-01 NOTE — Addendum Note (Signed)
Addended by: Colen Darling on: 05/01/2018 03:56 PM   Modules accepted: Orders

## 2018-05-01 NOTE — Patient Instructions (Signed)
 Nothing in vagina for 3 days (no sex, douching, tampons, etc...)  Check your strings once a month to make sure you can feel them, if you are not able to please let us know  If you develop a fever of 100.4 or more in the next few weeks, or if you develop severe abdominal pain, please let us know  Use a backup method of birth control, such as condoms, for 2 weeks    Intrauterine Device Insertion, Care After  This sheet gives you information about how to care for yourself after your procedure. Your health care provider may also give you more specific instructions. If you have problems or questions, contact your health care provider. What can I expect after the procedure? After the procedure, it is common to have:  Cramps and pain in the abdomen.  Light bleeding (spotting) or heavier bleeding that is like your menstrual period. This may last for up to a few days.  Lower back pain.  Dizziness.  Headaches.  Nausea. Follow these instructions at home:  Before resuming sexual activity, check to make sure that you can feel the IUD string(s). You should be able to feel the end of the string(s) below the opening of your cervix. If your IUD string is in place, you may resume sexual activity. ? If you had a hormonal IUD inserted more than 7 days after your most recent period started, you will need to use a backup method of birth control for 7 days after IUD insertion. Ask your health care provider whether this applies to you.  Continue to check that the IUD is still in place by feeling for the string(s) after every menstrual period, or once a month.  Take over-the-counter and prescription medicines only as told by your health care provider.  Do not drive or use heavy machinery while taking prescription pain medicine.  Keep all follow-up visits as told by your health care provider. This is important. Contact a health care provider if:  You have bleeding that is heavier or lasts longer than  a normal menstrual cycle.  You have a fever.  You have cramps or abdominal pain that get worse or do not get better with medicine.  You develop abdominal pain that is new or is not in the same area of earlier cramping and pain.  You feel lightheaded or weak.  You have abnormal or bad-smelling discharge from your vagina.  You have pain during sexual activity.  You have any of the following problems with your IUD string(s): ? The string bothers or hurts you or your sexual partner. ? You cannot feel the string. ? The string has gotten longer.  You can feel the IUD in your vagina.  You think you may be pregnant, or you miss your menstrual period.  You think you may have an STI (sexually transmitted infection). Get help right away if:  You have flu-like symptoms.  You have a fever and chills.  You can feel that your IUD has slipped out of place. Summary  After the procedure, it is common to have cramps and pain in the abdomen. It is also common to have light bleeding (spotting) or heavier bleeding that is like your menstrual period.  Continue to check that the IUD is still in place by feeling for the string(s) after every menstrual period, or once a month.  Keep all follow-up visits as told by your health care provider. This is important.  Contact your health care provider if   you have problems with your IUD string(s), such as the string getting longer or bothering you or your sexual partner. This information is not intended to replace advice given to you by your health care provider. Make sure you discuss any questions you have with your health care provider. Document Released: 09/22/2010 Document Revised: 12/16/2015 Document Reviewed: 12/16/2015 Elsevier Interactive Patient Education  2019 Else[employer] Inc.  Levonorgestrel intrauterine device (IUD) What is this medicine? LEVONORGESTREL IUD (LEE voe nor jes trel) is a contraceptive (birth control) device. The device is placed  inside the uterus by a healthcare professional. It is used to prevent pregnancy. This device can also be used to treat heavy bleeding that occurs during your period. This medicine may be used for other purposes; ask your health care provider or pharmacist if you have questions. COMMON BRAND NAME(S): Kyleena, LILETTA, Mirena, Skyla What should I tell my health care provider before I take this medicine? They need to know if you have any of these conditions: -abnormal Pap smear -cancer of the breast, uterus, or cervix -diabetes -endometritis -genital or pelvic infection now or in the past -have more than one sexual partner or your partner has more than one partner -heart disease -history of an ectopic or tubal pregnancy -immune system problems -IUD in place -liver disease or tumor -problems with blood clots or take blood-thinners -seizures -use intravenous drugs -uterus of unusual shape -vaginal bleeding that has not been explained -an unusual or allergic reaction to levonorgestrel, other hormones, silicone, or polyethylene, medicines, foods, dyes, or preservatives -pregnant or trying to get pregnant -breast-feeding How should I use this medicine? This device is placed inside the uterus by a health care professional. Talk to your pediatrician regarding the use of this medicine in children. Special care may be needed. Overdosage: If you think you have taken too much of this medicine contact a poison control center or emergency room at once. NOTE: This medicine is only for you. Do not share this medicine with others. What if I miss a dose? This does not apply. Depending on the brand of device you have inserted, the device will need to be replaced every 3 to 5 years if you wish to continue using this type of birth control. What may interact with this medicine? Do not take this medicine with any of the following medications: -amprenavir -bosentan -fosamprenavir This medicine may also  interact with the following medications: -aprepitant -armodafinil -barbiturate medicines for inducing sleep or treating seizures -bexarotene -boceprevir -griseofulvin -medicines to treat seizures like carbamazepine, ethotoin, felbamate, oxcarbazepine, phenytoin, topiramate -modafinil -pioglitazone -rifabutin -rifampin -rifapentine -some medicines to treat HIV infection like atazanavir, efavirenz, indinavir, lopinavir, nelfinavir, tipranavir, ritonavir -St. John's wort -warfarin This list may not describe all possible interactions. Give your health care provider a list of all the medicines, herbs, non-prescription drugs, or dietary supplements you use. Also tell them if you smoke, drink alcohol, or use illegal drugs. Some items may interact with your medicine. What should I watch for while using this medicine? Visit your doctor or health care professional for regular check ups. See your doctor if you or your partner has sexual contact with others, becomes HIV positive, or gets a sexual transmitted disease. This product does not protect you against HIV infection (AIDS) or other sexually transmitted diseases. You can check the placement of the IUD yourself by reaching up to the top of your vagina with clean fingers to feel the threads. Do not pull on the threads. It is a good habit   to check placement after each menstrual period. Call your doctor right away if you feel more of the IUD than just the threads or if you cannot feel the threads at all. The IUD may come out by itself. You may become pregnant if the device comes out. If you notice that the IUD has come out use a backup birth control method like condoms and call your health care provider. Using tampons will not change the position of the IUD and are okay to use during your period. This IUD can be safely scanned with magnetic resonance imaging (MRI) only under specific conditions. Before you have an MRI, tell your healthcare provider that  you have an IUD in place, and which type of IUD you have in place. What side effects may I notice from receiving this medicine? Side effects that you should report to your doctor or health care professional as soon as possible: -allergic reactions like skin rash, itching or hives, swelling of the face, lips, or tongue -fever, flu-like symptoms -genital sores -high blood pressure -no menstrual period for 6 weeks during use -pain, swelling, warmth in the leg -pelvic pain or tenderness -severe or sudden headache -signs of pregnancy -stomach cramping -sudden shortness of breath -trouble with balance, talking, or walking -unusual vaginal bleeding, discharge -yellowing of the eyes or skin Side effects that usually do not require medical attention (report to your doctor or health care professional if they continue or are bothersome): -acne -breast pain -change in sex drive or performance -changes in weight -cramping, dizziness, or faintness while the device is being inserted -headache -irregular menstrual bleeding within first 3 to 6 months of use -nausea This list may not describe all possible side effects. Call your doctor for medical advice about side effects. You may report side effects to FDA at 1-800-FDA-1088. Where should I keep my medicine? This does not apply. NOTE: This sheet is a summary. It may not cover all possible information. If you have questions about this medicine, talk to your doctor, pharmacist, or health care provider.  2019 Elsevier/Gold Standard (2015-11-06 14:14:56)  

## 2018-05-28 ENCOUNTER — Encounter: Payer: Self-pay | Admitting: *Deleted

## 2018-05-29 ENCOUNTER — Ambulatory Visit (INDEPENDENT_AMBULATORY_CARE_PROVIDER_SITE_OTHER): Payer: Medicaid Other | Admitting: Women's Health

## 2018-05-29 ENCOUNTER — Encounter: Payer: Self-pay | Admitting: Women's Health

## 2018-05-29 ENCOUNTER — Other Ambulatory Visit: Payer: Self-pay

## 2018-05-29 VITALS — Ht 67.0 in | Wt 215.0 lb

## 2018-05-29 DIAGNOSIS — Z30431 Encounter for routine checking of intrauterine contraceptive device: Secondary | ICD-10-CM | POA: Diagnosis not present

## 2018-05-29 NOTE — Progress Notes (Signed)
   TELEHEALTH VIRTUAL GYN VISIT ENCOUNTER NOTE Patient name: Jade Palmer MRN [id number]  Date of birth: 28-Mar-1990  I connected with patient on 05/29/18 at  3:15 PM EDT by telephone (at work, unable to [employer]) and verified that I am speaking with the correct person using two identifiers.  Due to COVID-19 recommendations, pt is not currently in the office.    I discussed the limitations, risks, security and privacy concerns of performing an evaluation and management service by telephone and the availability of in person appointments. I also discussed with the patient that there may be a patient responsible charge related to this service. The patient expressed understanding and agreed to proceed.   Chief Complaint:   IUD check (having no problems with IUD)  History of Present Illness:   Jade Palmer is a 28 y.o. [phone] Caucasian female being evaluated today for IUD f/u. Had Liletta inserted 05/01/18. No pain/problems. Able to feel strings. Had period, now just light spotting.  Boils have completely gone away.   No LMP recorded. (Menstrual status: IUD). The current method of family planning is IUD. Last pap 04/20/16. Results were:  normal Review of Systems:   Pertinent items are noted in HPI Denies fever/chills, dizziness, headaches, visual disturbances, fatigue, shortness of breath, chest pain, abdominal pain, vomiting, abnormal vaginal discharge/itching/odor/irritation, problems with periods, bowel movements, urination, or intercourse unless otherwise stated above.  Pertinent History Reviewed:  Reviewed past medical,surgical, social, obstetrical and family history.  Reviewed problem list, medications and allergies. Physical Assessment:   Vitals:   05/29/18 1427  Weight: 215 lb (97.5 kg)  Height: 5\' 7"  (1.702 m)  Body mass index is 33.67 kg/m.       Physical Examination:   General:  Alert, oriented and cooperative.   Mental Status: Normal mood and affect perceived.  Normal judgment and thought content.  Physical exam deferred due to nature of the encounter  No results found for this or any previous visit (from the past 24 hour(s)).  Assessment & Plan:  1) IUD f/u> doing well, no problems  Meds: No orders of the defined types were placed in this encounter.   No orders of the defined types were placed in this encounter.   I discussed the assessment and treatment plan with the patient. The patient was provided an opportunity to ask questions and all were answered. The patient agreed with the plan and demonstrated an understanding of the instructions.   The patient was advised to call back or seek an in-person evaluation/go to the ED if the symptoms worsen or if the condition fails to improve as anticipated.  I provided 5 minutes of non-face-to-face time during this encounter.   Return in about 1 year (around 05/29/2019) for Pap & physical.  Cheral Marker CNM, [facility] 05/29/2018 2:45 PM

## 2018-10-04 IMAGING — US US RENAL
1 series · 14 of 25 positions shown · non-contrast
Comparison: CT abdomen/pelvis dated 03/02/2010

CLINICAL DATA: Left flank pain, 36 weeks pregnant

EXAM:
RENAL / URINARY TRACT ULTRASOUND COMPLETE

[Series 1: us renal · 0.27mm/px · 14 of 26 slices shown]
[im 1/26]
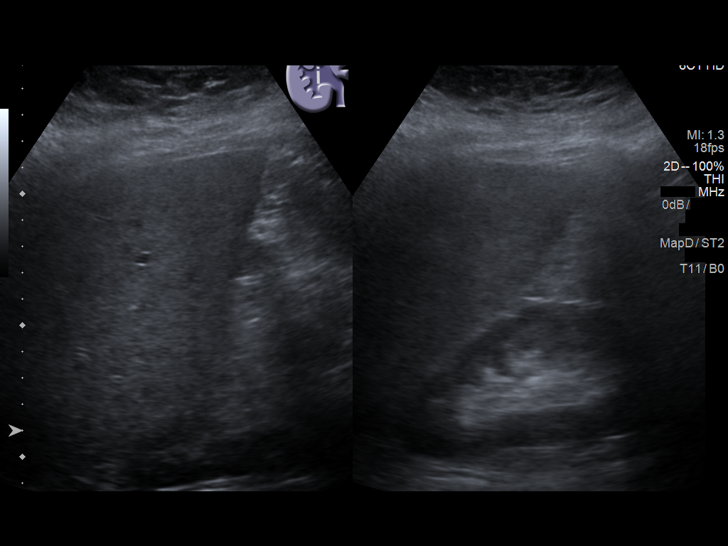
[im 3/26]
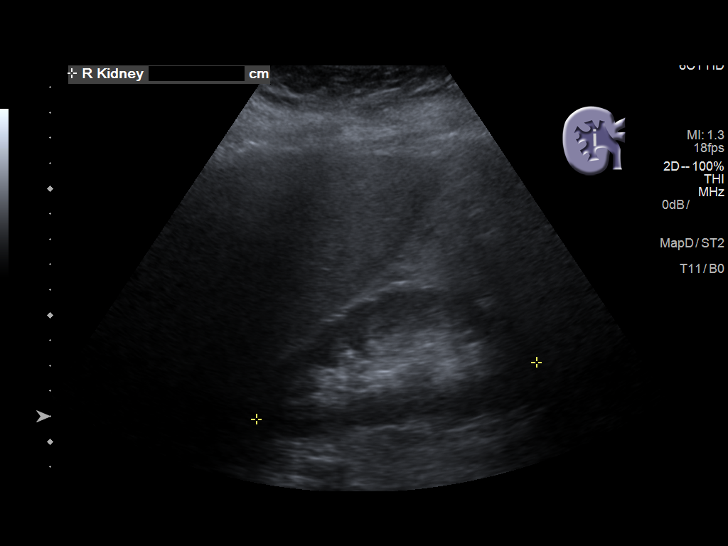
[im 5/26]
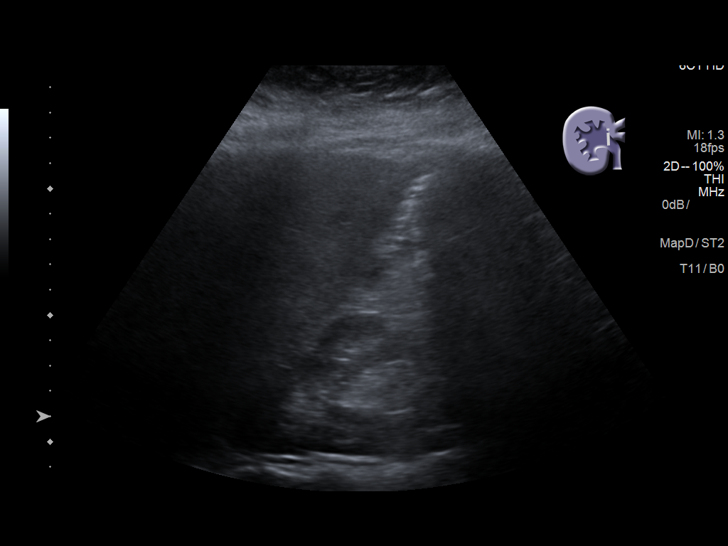
[im 7/26]
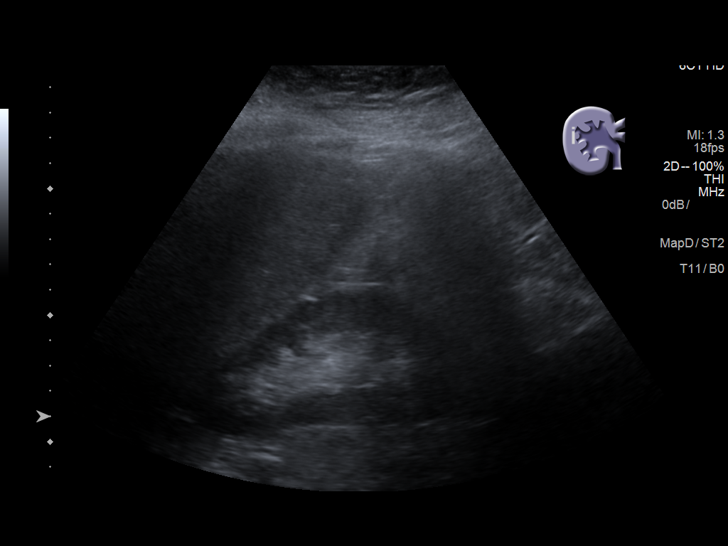
[im 9/26]
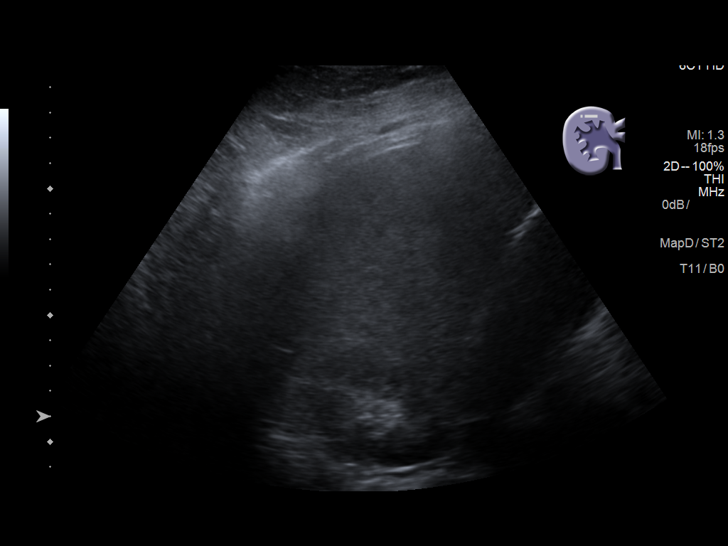
[im 10/26]
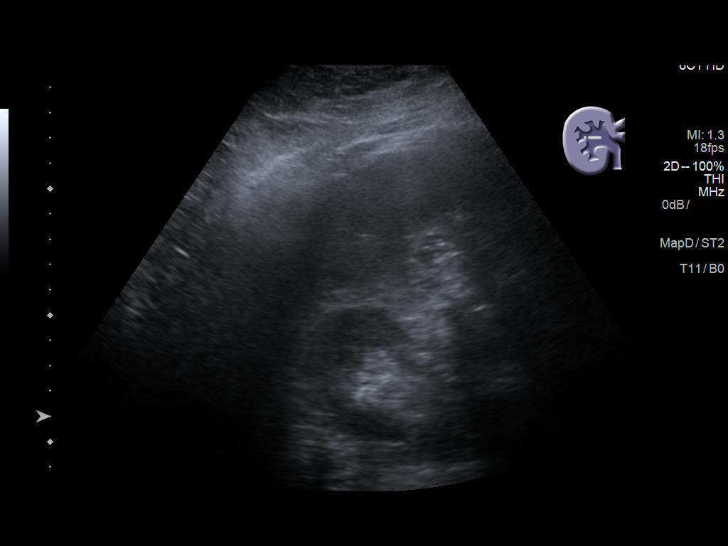
[im 12/26]
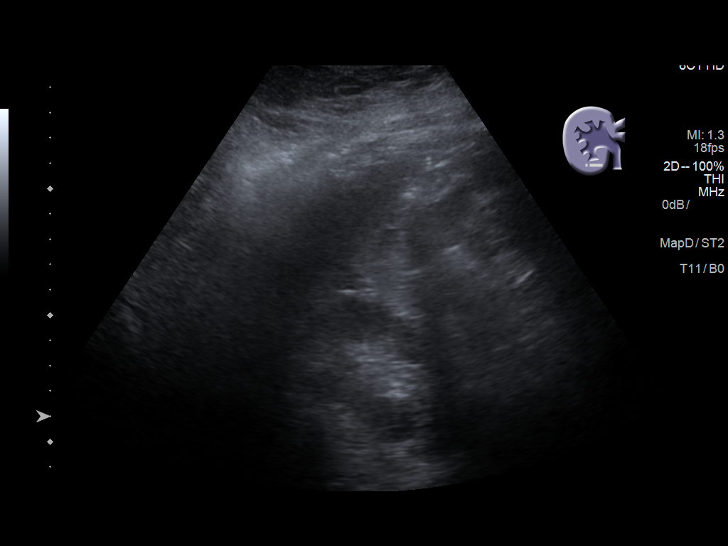
[im 14/26]
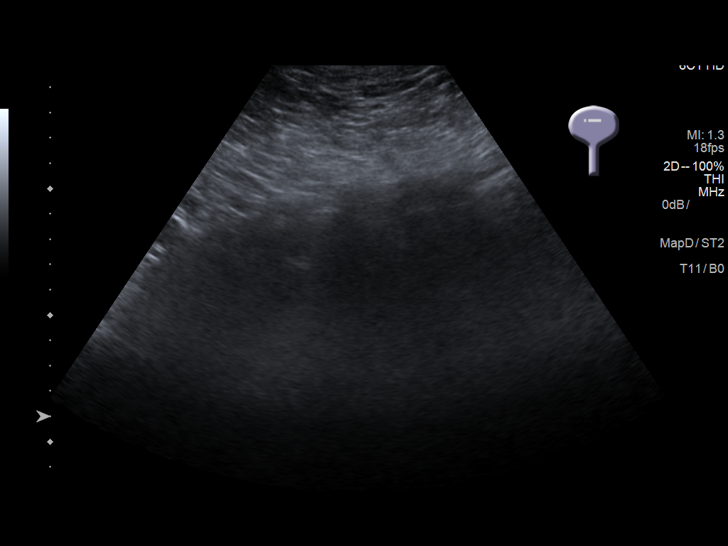
[im 16/26]
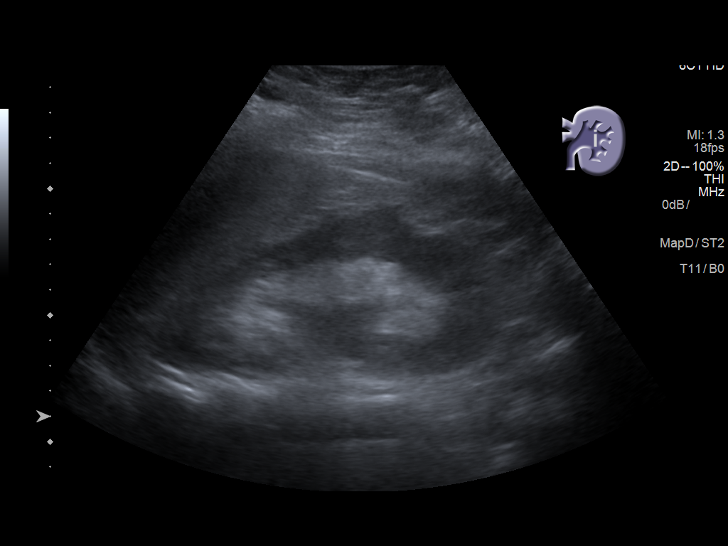
[im 17/26]
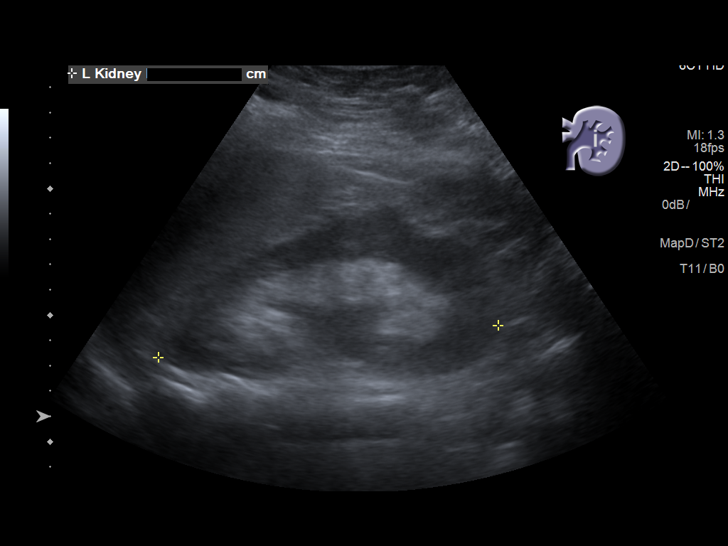
[im 19/26]
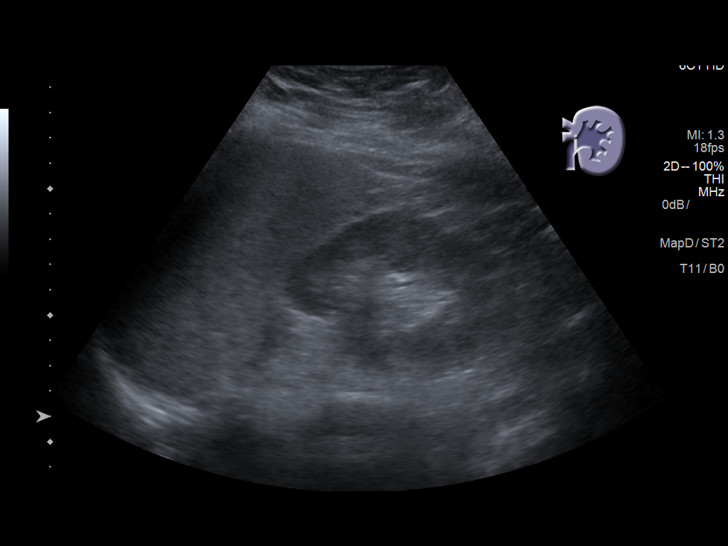
[im 21/26]
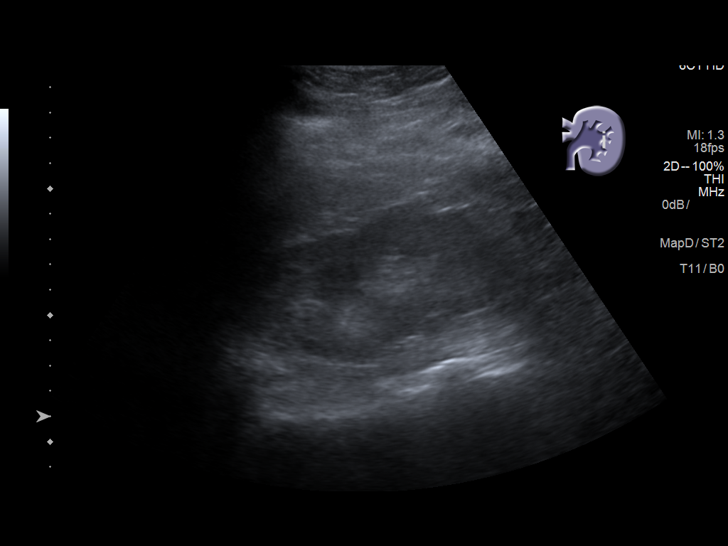
[im 23/26]
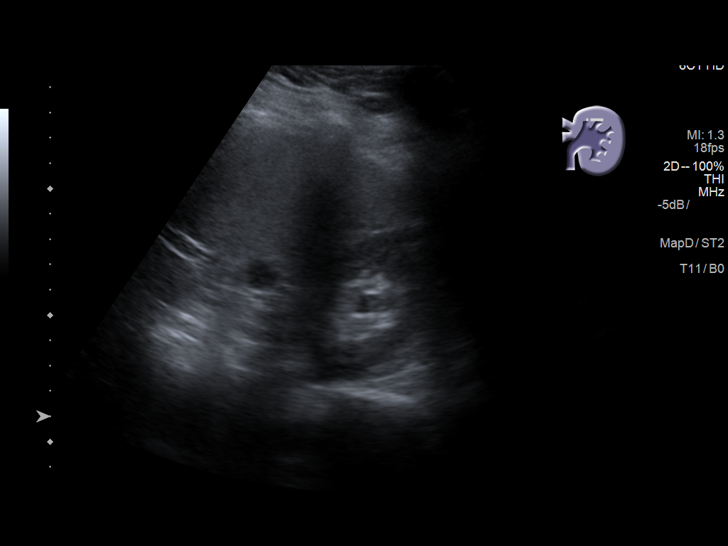
[im 26/26]
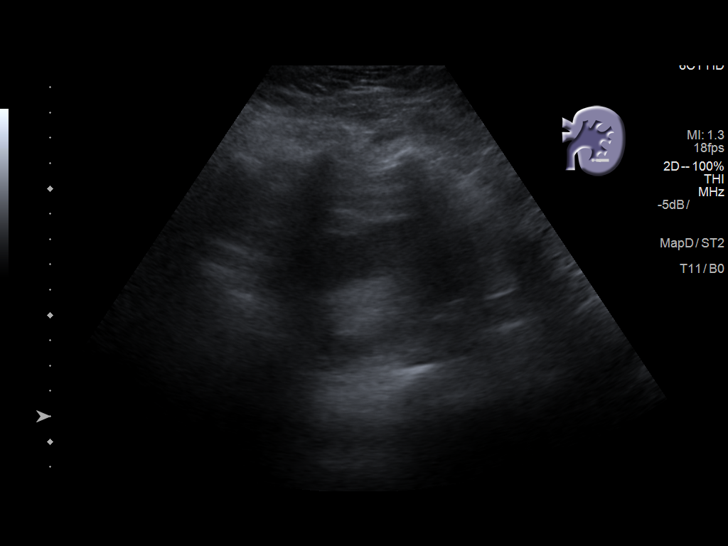

[14 of 25 positions shown; findings below may reference images not displayed]

FINDINGS: Right Kidney:

Length: 11.3 cm.  No mass or hydronephrosis.

Left Kidney:

Length: 13.5 cm.  Mild left hydronephrosis.

Bladder:

Not visualized/decompressed.
IMPRESSION: Mild left hydronephrosis.

## 2018-10-06 IMAGING — US US RENAL
1 series · 14 of 25 positions shown · non-contrast
Comparison: 04/09/2017

CLINICAL DATA: LEFT flank pain for 3 days, history of kidney
stones, 36 weeks pregnant

EXAM:
RENAL / URINARY TRACT ULTRASOUND COMPLETE

[Series 1: us renal · 0.28mm/px · 14 of 37 slices shown]
[im 1/37]
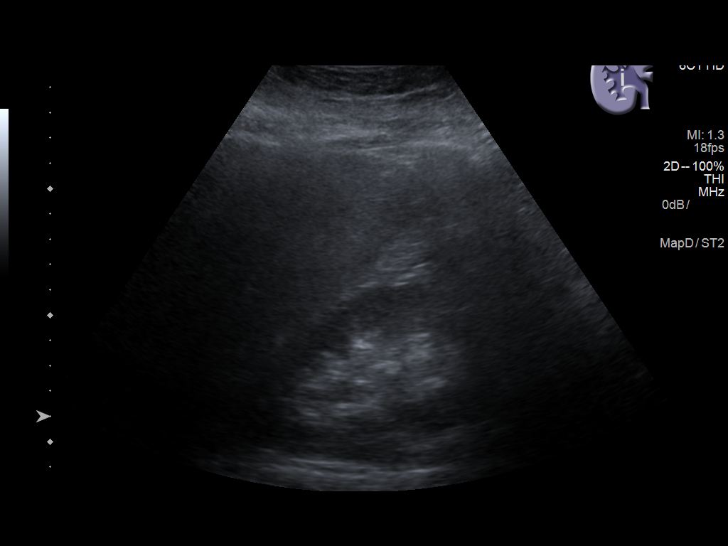
[im 4/37]
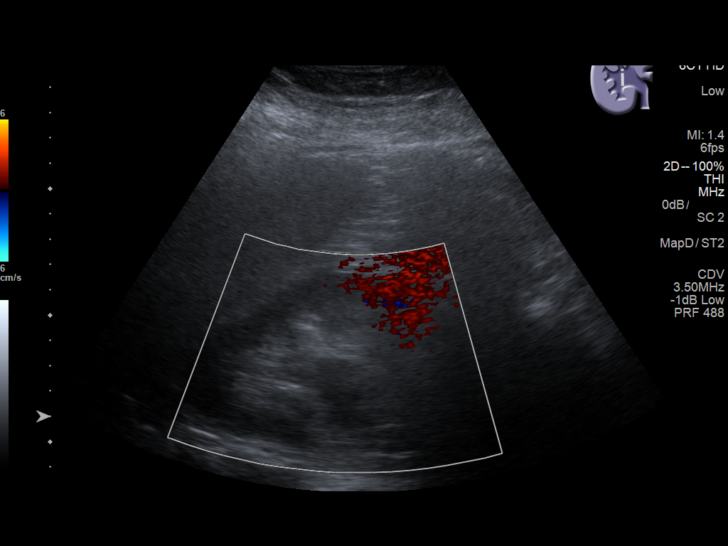
[im 7/37]
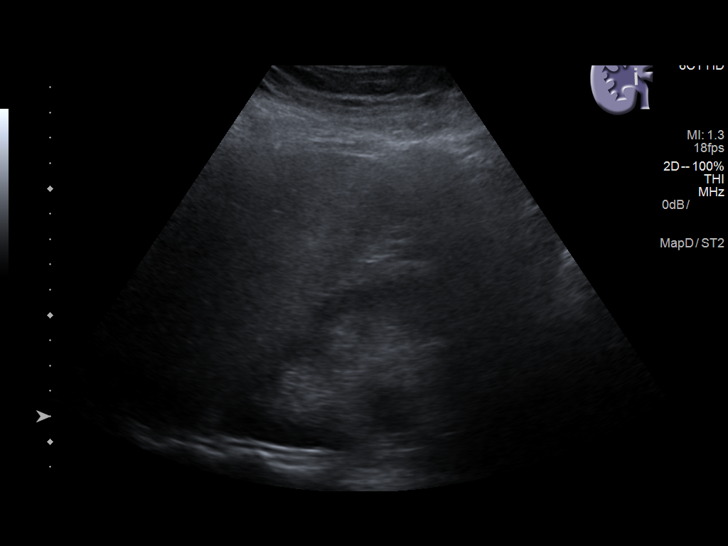
[im 10/37]
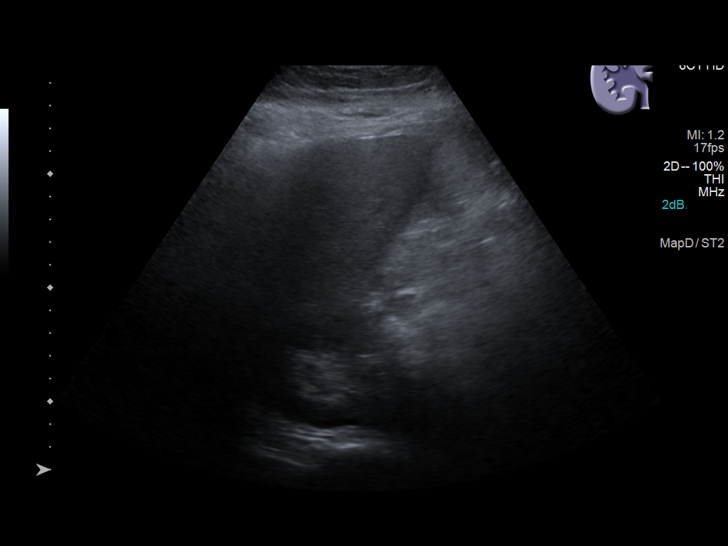
[im 13/37]
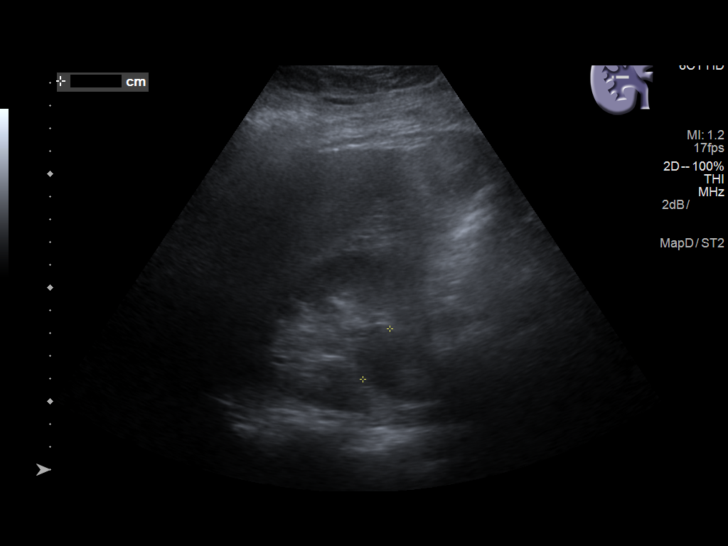
[im 14/37]
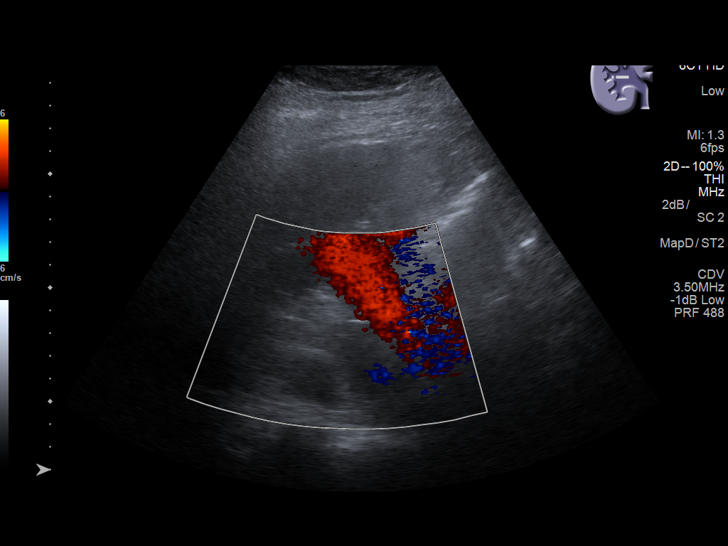
[im 17/37]
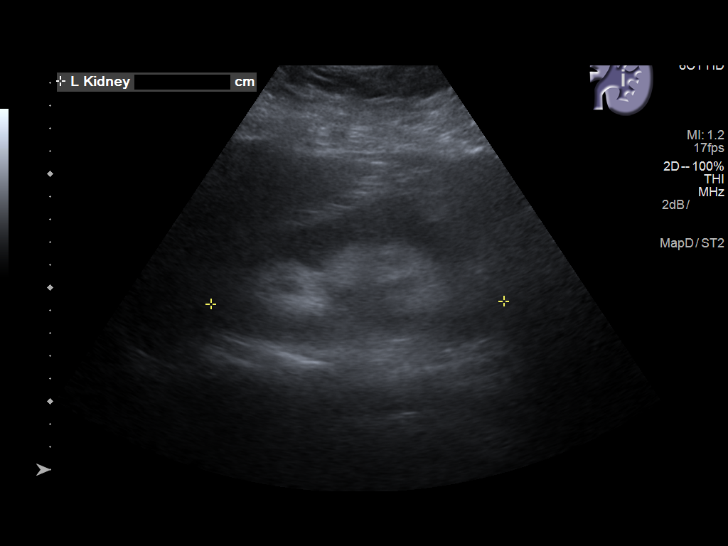
[im 20/37]
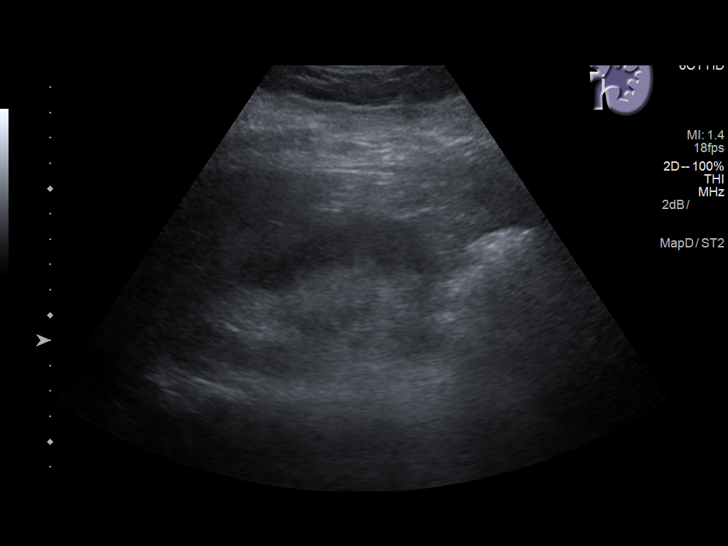
[im 23/37]
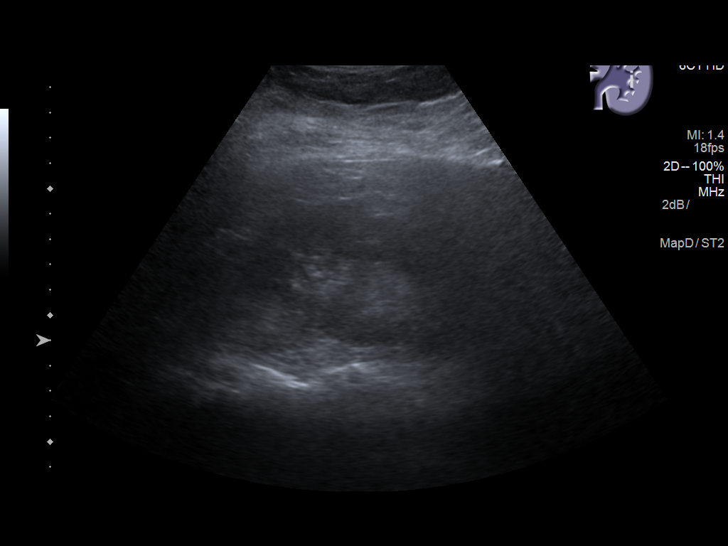
[im 25/37]
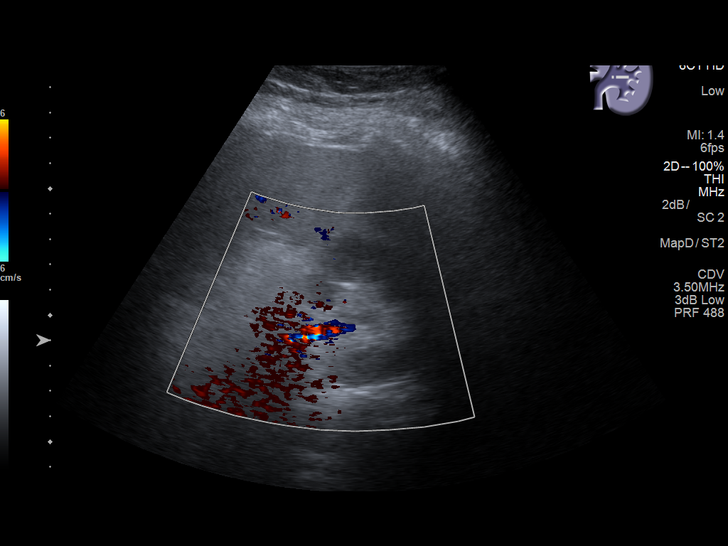
[im 28/37]
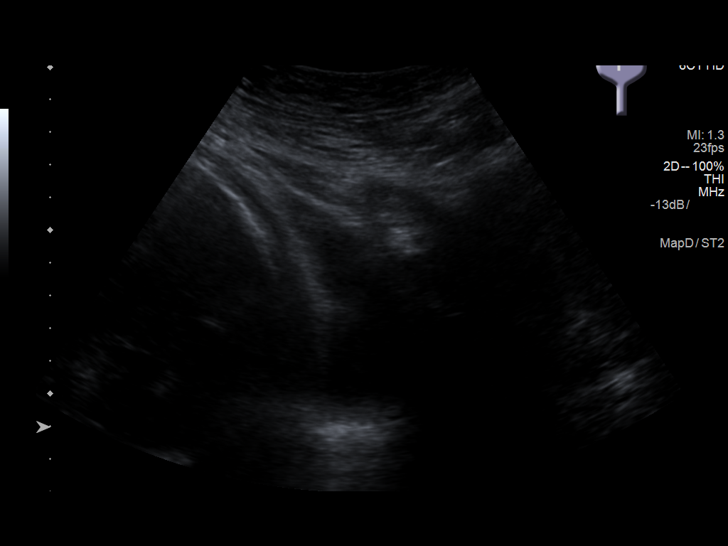
[im 31/37]
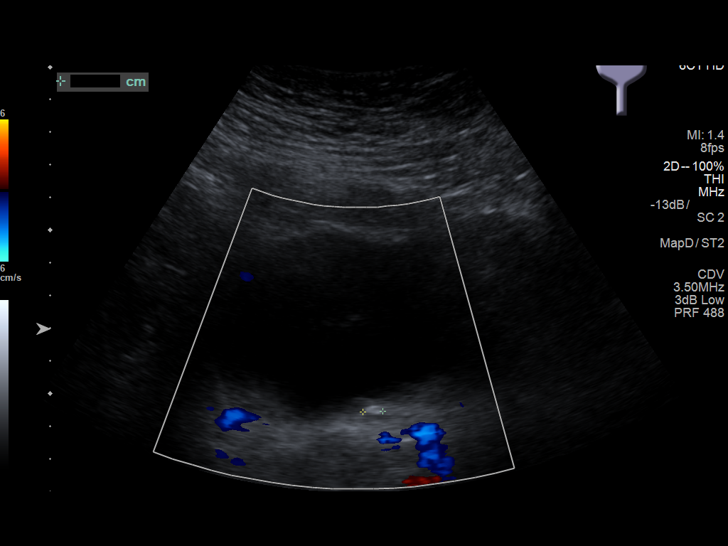
[im 34/37]
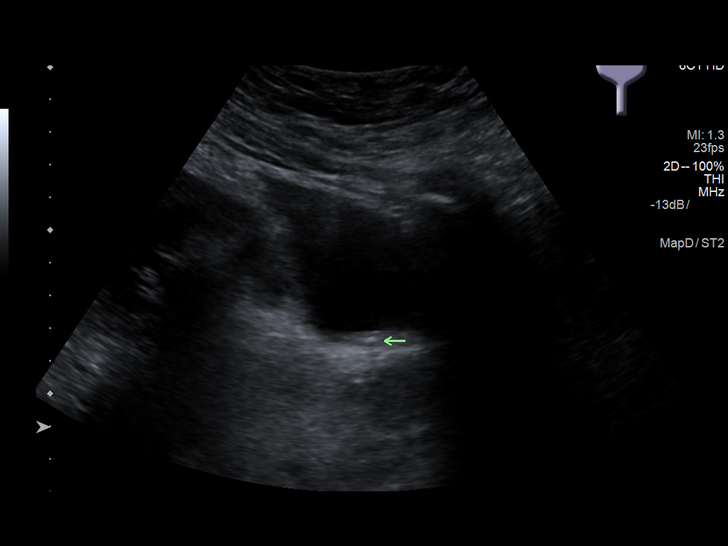
[im 37/37]
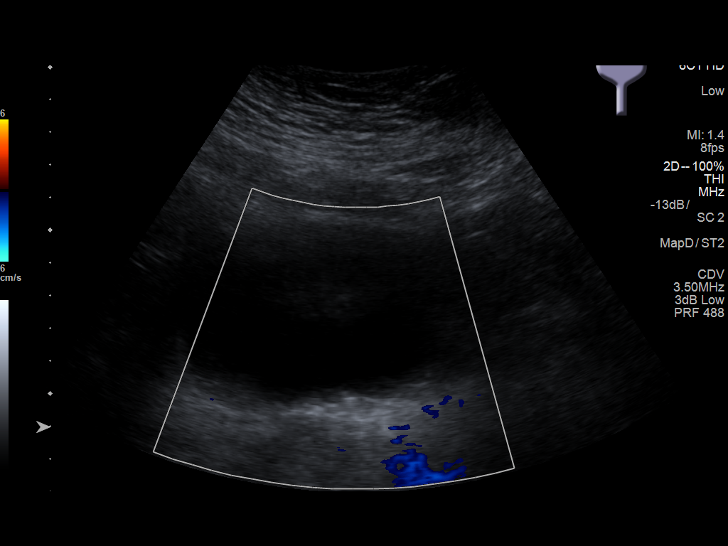

[14 of 25 positions shown; findings below may reference images not displayed]

FINDINGS: Right Kidney:

Length: 11.4 cm. Normal cortical thickness and echogenicity. Minimal
dilatation of the RIGHT renal collecting system increased since
prior exam. No gross evidence of renal mass or shadowing
calcification.

Left Kidney:

Length: 12.9 cm. Normal cortical thickness and echogenicity. Mild
central LEFT renal collecting system dilatation again seen. No renal
mass or shadowing calcification

Bladder:

Partially distended, without gross mass or wall thickening. 6 mm
echogenic focus at LEFT bladder base could represent a LEFT
ureterovesical junction calculus. Bladder was not adequately
visualized on the previous exam due to decompressed state.
IMPRESSION: Mild LEFT hydronephrosis unchanged.

6 mm diameter echogenic focus at the LEFT bladder base, question
LEFT ureterovesical junction calculus.

Minimally prominent RIGHT renal collecting system new since prior
exam, could potentially be related to gravid uterus.

## 2020-02-05 ENCOUNTER — Ambulatory Visit
Admission: EM | Admit: 2020-02-05 | Discharge: 2020-02-05 | Disposition: A | Discharge status: Home / Self Care | Payer: Medicaid Other | Attending: Family Medicine | Admitting: Family Medicine

## 2020-02-05 ENCOUNTER — Other Ambulatory Visit: Payer: Self-pay

## 2020-02-05 ENCOUNTER — Encounter: Payer: Self-pay | Admitting: Emergency Medicine

## 2020-02-05 DIAGNOSIS — R519 Headache, unspecified: Secondary | ICD-10-CM

## 2020-02-05 DIAGNOSIS — B349 Viral infection, unspecified: Secondary | ICD-10-CM

## 2020-02-05 DIAGNOSIS — R509 Fever, unspecified: Secondary | ICD-10-CM

## 2020-02-05 DIAGNOSIS — R52 Pain, unspecified: Secondary | ICD-10-CM

## 2020-02-05 DIAGNOSIS — R059 Cough, unspecified: Secondary | ICD-10-CM | POA: Diagnosis not present

## 2020-02-05 DIAGNOSIS — R6883 Chills (without fever): Secondary | ICD-10-CM

## 2020-02-05 DIAGNOSIS — J3489 Other specified disorders of nose and nasal sinuses: Secondary | ICD-10-CM | POA: Diagnosis not present

## 2020-02-05 LAB — POCT INFLUENZA A/B
Influenza A, POC: NEGATIVE
Influenza B, POC: NEGATIVE

## 2020-02-05 MED ORDER — PROMETHAZINE-DM 6.25-15 MG/5ML PO SYRP: 5.0000 mL | ORAL_SOLUTION | Freq: Four times a day (QID) | ORAL | 0 refills | Status: DC | PRN

## 2020-02-05 NOTE — ED Triage Notes (Signed)
Cough,  Body aches, congestion, sneezing, fever . Took day quil x 2 hours ago. States her temp has been 102.  Pt had covid last year and has had both covid vaccines.

## 2020-02-05 NOTE — Discharge Instructions (Signed)
Rapid flu was negative in the office today  I have sent in cough syrup for you to take. This medication can make you sleepy. Do not drive while taking this medication.  Your COVID and Flu tests are pending.  You should self quarantine until the test results are back.    Take Tylenol or ibuprofen as needed for fever or discomfort.  Rest and keep yourself hydrated.    Follow-up with your primary care provider if your symptoms are not improving.

## 2020-02-05 NOTE — ED Provider Notes (Signed)
[facility]   [id number] 02/05/20 Arrival Time: 1659   CC: COVID symptoms  SUBJECTIVE: History from: patient.  Jade Palmer is a 29 y.o. female who presents with abrupt onset of nasal congestion, PND, fever, body aches, chills, cough for the last 4 days. Denies sick exposure to COVID, flu or strep. Denies recent travel. Has positive history of Covid. Has completed Covid vaccines. Has taken OTC medications for this with some relief. There are no aggravating or alleviating factors. Denies previous symptoms in the past. Denies sinus pain, rhinorrhea, sore throat, SOB, wheezing, chest pain, nausea, changes in bowel or bladder habits.    ROS: As per HPI.  All other pertinent ROS negative.     Past Medical History:  Diagnosis Date  . Calculus of kidney affecting pregnancy 04/11/2017   Kidney stone  . Contraceptive education 04/12/2013  . History of kidney stones   . Seizures (HCC)    had seizure at age 85. Unknown etiology- no meds for this in about 5 years   Past Surgical History:  Procedure Laterality Date  . BALLOON DILATION Right 04/25/2013   Procedure: RIGHT URETER BALLOON DILATION;  Surgeon: Ky Barban, MD;  Location: [facility];  Service: Urology;  Laterality: Right;  . CYSTOSCOPY WITH STENT PLACEMENT Left 04/12/2017   Procedure: CYSTOSCOPY;  Surgeon: Riki Altes, MD;  Location: [facility];  Service: Urology;  Laterality: Left;  . CYSTOSCOPY/RETROGRADE/URETEROSCOPY/STONE EXTRACTION WITH BASKET Right 04/25/2013   Procedure: CYSTOSCOPY/RIGHT RETROGRADE/RIGHT URETEROSCOPY/BASKET;  Surgeon: Ky Barban, MD;  Location: [facility];  Service: Urology;  Laterality: Right;  . TONSILLECTOMY    . URETEROSCOPY WITH HOLMIUM LASER LITHOTRIPSY Left 04/12/2017   Procedure: URETEROSCOPY;  Surgeon: Riki Altes, MD;  Location: [facility];  Service: Urology;  Laterality: Left;   No Known Allergies No current facility-administered medications on file prior to encounter.    Current Outpatient Medications on File Prior to Encounter  Medication Sig Dispense Refill  . Levonorgestrel (LILETTA, 52 MG,) 19.5 MCG/DAY IUD IUD 1 each by Intrauterine route once.    . Prenatal Vit-Fe Fumarate-FA (PRENATAL MULTIVITAMIN) TABS tablet Take 1 tablet by mouth daily at 12 noon.     Social History   Socioeconomic History  . Marital status: Legally Separated    Spouse name: Not on file  . Number of children: 3  . Years of education: Not on file  . Highest education level: Not on file  Occupational History  . Not on file  Tobacco Use  . Smoking status: Never Smoker  . Smokeless tobacco: Never Used  Vaping Use  . Vaping Use: Never used  Substance and Sexual Activity  . Alcohol use: No    Comment: rarely  . Drug use: No  . Sexual activity: Yes    Birth control/protection: I.U.D.  Other Topics Concern  . Not on file  Social History Narrative  . Not on file   Social Determinants of Health   Financial Resource Strain: Not on file  Food Insecurity: Not on file  Transportation Needs: Not on file  Physical Activity: Not on file  Stress: Not on file  Social Connections: Not on file  Intimate Partner Violence: Not on file   Family History  Problem Relation Age of Onset  . Hyperlipidemia Mother   . Hypertension Father   . Cancer Maternal Grandmother        lung  . Cancer Maternal Grandfather        bone  . Diabetes Paternal Grandfather   .  Hypertension Paternal Grandmother   . Other Son        in NICU    OBJECTIVE:  Vitals:   02/05/20 1736 02/05/20 1738  BP:  126/86  Pulse:  (!) 104  Resp:  18  Temp:  99.7 F (37.6 C)  TempSrc:  Oral  SpO2:  97%  Weight: 200 lb (90.7 kg)   Height: 5\' 6"  (1.676 m)      General appearance: alert; appears fatigued, but nontoxic; speaking in full sentences and tolerating own secretions HEENT: NCAT; Ears: EACs clear, TMs pearly gray; Eyes: PERRL.  EOM grossly intact. Sinuses: nontender; Nose: nares patent without  rhinorrhea, Throat: oropharynx erythematous, cobblestoning present, tonsils non erythematous or enlarged, uvula midline  Neck: supple with LAD Lungs: unlabored respirations, symmetrical air entry; cough: moderate; no respiratory distress; CTAB Heart: regular rate and rhythm.  Radial pulses 2+ symmetrical bilaterally Skin: warm and dry Psychological: alert and cooperative; normal mood and affect  LABS:  Results for orders placed or performed during the hospital encounter of 02/05/20 (from the past 24 hour(s))  POCT Influenza A/B     Status: None   Collection Time: 02/05/20  5:39 PM  Result Value Ref Range   Influenza A, POC Negative Negative   Influenza B, POC Negative Negative     ASSESSMENT & PLAN:  1. Viral illness   2. Cough   3. Rhinorrhea   4. Fever, unspecified fever cause   5. Body aches   6. Chills   7. Nonintractable headache, unspecified chronicity pattern, unspecified headache type     Meds ordered this encounter  Medications  . promethazine-dextromethorphan (PROMETHAZINE-DM) 6.25-15 MG/5ML syrup    Sig: Take 5 mLs by mouth 4 (four) times daily as needed for cough.    Dispense:  118 mL    Refill:  0    Order Specific Question:   Supervising Provider    Answer:   04-11-1991 Merrilee Jansky   Prescribed promethazine cough syrup Sedation precautions given Rapid flu negative in office COVID and flu testing ordered.  It will take between 1-2 days for test results.  Someone will contact you regarding abnormal results.   Work note provided Patient should remain in quarantine until they have received Covid results.  If negative you may resume normal activities (go back to work/school) while practicing hand hygiene, social distance, and mask wearing.  If positive, patient should remain in quarantine for 10 days from symptom onset AND greater than 72 hours after symptoms resolution (absence of fever without the use of fever-reducing medication and improvement in  respiratory symptoms), whichever is longer Get plenty of rest and push fluids Use OTC zyrtec for nasal congestion, runny nose, and/or sore throat Use OTC flonase for nasal congestion and runny nose Use medications daily for symptom relief Use OTC medications like ibuprofen or tylenol as needed fever or pain Call or go to the ED if you have any new or worsening symptoms such as fever, worsening cough, shortness of breath, chest tightness, chest pain, turning blue, changes in mental status.  Reviewed expectations re: course of current medical issues. Questions answered. Outlined signs and symptoms indicating need for more acute intervention. Patient verbalized understanding. After Visit Summary given.         [id number], NP 02/05/20 1808

## 2020-02-06 LAB — COVID-19, FLU A+B NAA
Influenza A, NAA: NOT DETECTED
Influenza B, NAA: NOT DETECTED
SARS-CoV-2, NAA: DETECTED — AB

## 2020-05-11 ENCOUNTER — Other Ambulatory Visit: Payer: Self-pay

## 2020-05-11 ENCOUNTER — Other Ambulatory Visit: Payer: Self-pay | Admitting: *Deleted

## 2020-05-11 ENCOUNTER — Ambulatory Visit: Payer: Managed Care, Other (non HMO) | Admitting: Obstetrics & Gynecology

## 2020-05-11 ENCOUNTER — Encounter: Payer: Self-pay | Admitting: Obstetrics & Gynecology

## 2020-05-11 VITALS — BP 143/94 | HR 80 | Ht 66.0 in | Wt 196.0 lb

## 2020-05-11 DIAGNOSIS — N816 Rectocele: Secondary | ICD-10-CM

## 2020-05-11 DIAGNOSIS — Z30431 Encounter for routine checking of intrauterine contraceptive device: Secondary | ICD-10-CM

## 2020-05-11 DIAGNOSIS — N8110 Cystocele, unspecified: Secondary | ICD-10-CM

## 2020-05-11 DIAGNOSIS — Z6831 Body mass index (BMI) 31.0-31.9, adult: Secondary | ICD-10-CM

## 2020-05-11 MED ORDER — POLYETHYLENE GLYCOL 3350 17 GM/SCOOP PO POWD: ORAL | 11 refills | Status: DC

## 2020-05-11 NOTE — Progress Notes (Unsigned)
Chief Complaint  Patient presents with  . Bowel issue      30 y.o. Jade Palmer No LMP recorded. (Menstrual status: IUD). The current method of family planning is {contraception:315051}.  Outpatient Encounter Medications as of 05/11/2020  Medication Sig  . amoxicillin (AMOXIL) 500 MG capsule Take by mouth.  . levonorgestrel (LILETTA) 19.5 MCG/DAY IUD IUD 1 each by Intrauterine route once.  . polyethylene glycol powder (GLYCOLAX/MIRALAX) 17 GM/SCOOP powder 1-2 scoops daily or as needed  . [DISCONTINUED] Prenatal Vit-Fe Fumarate-FA (PRENATAL MULTIVITAMIN) TABS tablet Take 1 tablet by mouth daily at 12 noon.  . [DISCONTINUED] promethazine-dextromethorphan (PROMETHAZINE-DM) 6.25-15 MG/5ML syrup Take 5 mLs by mouth 4 (four) times daily as needed for cough.   No facility-administered encounter medications on file as of 05/11/2020.    Subjective *** Past Medical History:  Diagnosis Date  . Calculus of kidney affecting pregnancy 04/11/2017   Kidney stone  . Contraceptive education 04/12/2013  . History of kidney stones   . Seizures (HCC)    had seizure at age 28. Unknown etiology- no meds for this in about 5 years    Past Surgical History:  Procedure Laterality Date  . BALLOON DILATION Right 04/25/2013   Procedure: RIGHT URETER BALLOON DILATION;  Surgeon: Ky Barban, MD;  Location: [facility];  Service: Urology;  Laterality: Right;  . CYSTOSCOPY WITH STENT PLACEMENT Left 04/12/2017   Procedure: CYSTOSCOPY;  Surgeon: Riki Altes, MD;  Location: [facility];  Service: Urology;  Laterality: Left;  . CYSTOSCOPY/RETROGRADE/URETEROSCOPY/STONE EXTRACTION WITH BASKET Right 04/25/2013   Procedure: CYSTOSCOPY/RIGHT RETROGRADE/RIGHT URETEROSCOPY/BASKET;  Surgeon: Ky Barban, MD;  Location: [facility];  Service: Urology;  Laterality: Right;  . TONSILLECTOMY    . URETEROSCOPY WITH HOLMIUM LASER LITHOTRIPSY Left 04/12/2017   Procedure: URETEROSCOPY;  Surgeon: Riki Altes, MD;  Location: [facility]  [facility];  Service: Urology;  Laterality: Left;    OB History    Gravida  3   Para  3   Term  1   Preterm  2   AB      Living  3     SAB      IAB      Ectopic      Multiple  0   Live Births  3           No Known Allergies  Social History   Socioeconomic History  . Marital status: Married    Spouse name: Not on file  . Number of children: 3  . Years of education: Not on file  . Highest education level: Not on file  Occupational History  . Not on file  Tobacco Use  . Smoking status: Never Smoker  . Smokeless tobacco: Never Used  Vaping Use  . Vaping Use: Never used  Substance and Sexual Activity  . Alcohol use: No    Comment: rarely  . Drug use: No  . Sexual activity: Yes    Birth control/protection: I.U.D.  Other Topics Concern  . Not on file  Social History Narrative  . Not on file   Social Determinants of Health   Financial Resource Strain: Not on file  Food Insecurity: Not on file  Transportation Needs: Not on file  Physical Activity: Not on file  Stress: Not on file  Social Connections: Not on file    Family History  Problem Relation Age of Onset  . Hyperlipidemia Mother   . Hypertension Father   . Cancer Maternal Grandmother  lung  . Cancer Maternal Grandfather        bone  . Diabetes Paternal Grandfather   . Hypertension Paternal Grandmother   . Other Son        in NICU    Medications:       Current Outpatient Medications:  .  amoxicillin (AMOXIL) 500 MG capsule, Take by mouth., Disp: , Rfl:  .  levonorgestrel (LILETTA) 19.5 MCG/DAY IUD IUD, 1 each by Intrauterine route once., Disp: , Rfl:  .  polyethylene glycol powder (GLYCOLAX/MIRALAX) 17 GM/SCOOP powder, 1-2 scoops daily or as needed, Disp: 255 g, Rfl: 11  Objective Blood pressure (!) 143/94, pulse 80, height 5\' 6"  (1.676 m), weight 196 lb (88.9 kg), not currently breastfeeding.  ***  Pertinent ROS ***  Labs or studies ***    Impression Diagnoses this  Encounter::   ICD-10-CM   1. 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  Miralax powder and referral to Pelvic PT for evaluation and management.  I recommend avoiding surgical correction at this age  30. IUD check up  Z30.431   3. BMI 31.0-31.9,adult  Z68.31    will help her rectoceole and pelvic floor strength, desires to lose weight as well. recommended my primary nutrition resoure, Eat for Life    Established relevant diagnosis(es): ***  Plan/Recommendations: Meds ordered this encounter  Medications  . polyethylene glycol powder (GLYCOLAX/MIRALAX) 17 GM/SCOOP powder    Sig: 1-2 scoops daily or as needed    Dispense:  255 g    Refill:  11    Labs or Scans Ordered: No orders of the defined types were placed in this encounter.   Management:: ***  Follow up Return if symptoms worsen or fail to improve, for message me with response on MyChart in 2 weks or so.     [phone]: 10 min     99213: 15 min      99214:25 min}   Face to face time:  *** minutes  Greater than 50% of the visit time was spent in counseling and coordination of care with the patient.  The summary and outline of the counseling and care coordination is summarized in the note above.   All questions were answered.

## 2020-05-13 ENCOUNTER — Telehealth: Payer: Self-pay | Admitting: *Deleted

## 2020-05-13 ENCOUNTER — Other Ambulatory Visit: Payer: Self-pay | Admitting: *Deleted

## 2020-05-13 DIAGNOSIS — N8110 Cystocele, unspecified: Secondary | ICD-10-CM

## 2020-05-13 NOTE — Telephone Encounter (Signed)
Erroneous encounter

## 2020-07-15 ENCOUNTER — Ambulatory Visit: Payer: Managed Care, Other (non HMO) | Admitting: Physical Therapy

## 2021-09-17 ENCOUNTER — Ambulatory Visit (INDEPENDENT_AMBULATORY_CARE_PROVIDER_SITE_OTHER): Payer: Managed Care, Other (non HMO) | Admitting: Podiatry

## 2021-09-17 DIAGNOSIS — B070 Plantar wart: Secondary | ICD-10-CM | POA: Diagnosis not present

## 2021-09-17 NOTE — Progress Notes (Unsigned)
Subjective:   Patient ID: Jade Palmer, female   DOB: 31 y.o.   MRN: [id number]   HPI Had a lesion on the right foot previously and she trimmed them out. She has one on the right foot xub 2 and heel.  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

## 2021-09-17 NOTE — Patient Instructions (Signed)
Take dressing off in 8 hours and wash the foot with soap and water. If it is hurting or becomes uncomfortable before the 8 hours, go ahead and remove the bandage and wash the area.  If it blisters, apply antibiotic ointment and a band-aid.  Monitor for any signs/symptoms of infection. Call the office immediately if any occur or go directly to the emergency room. Call with any questions/concerns.   

## 2021-10-22 ENCOUNTER — Ambulatory Visit (INDEPENDENT_AMBULATORY_CARE_PROVIDER_SITE_OTHER): Payer: Managed Care, Other (non HMO) | Admitting: Podiatry

## 2021-10-22 DIAGNOSIS — B070 Plantar wart: Secondary | ICD-10-CM | POA: Diagnosis not present

## 2021-10-22 NOTE — Patient Instructions (Signed)
Take dressing off in 8 hours and wash the foot with soap and water. If it is hurting or becomes uncomfortable before the 8 hours, go ahead and remove the bandage and wash the area.  If it blisters, apply antibiotic ointment and a band-aid.  Monitor for any signs/symptoms of infection. Call the office immediately if any occur or go directly to the emergency room. Call with any questions/concerns.   

## 2021-10-25 NOTE — Progress Notes (Signed)
Subjective: 31 year old female presents the office today for follow-up evaluation of warts on the left foot those have resolved but she has noticed a new lesion on the right foot.  Area is tender as it is on her heel.  No swelling redness or any drainage.  Objective: AAO x3, NAD DP/PT pulses palpable bilaterally, CRT less than 3 seconds On the plantar aspect the right heel is a punctate annular hyperkeratotic lesion with capillary budding consistent with verruca.  No edema, erythema or signs of infection.  The lesions in the left foot are resolving unable to visualize this today. No pain with calf compression, swelling, warmth, erythema  Assessment: Plantar wart right foot  Plan: -All treatment options discussed with the patient including all alternatives, risks, complications.  -Sharply debrided lesion with any complications.  Pinpoint bleeding noted.  Skin was cleaned with alcohol and Cantharone Plus was applied followed by an occlusive bandage.  Postprocedure instructions discussed.  Monitor for any signs or symptoms of infection. -If no improvement next couple weeks consider ordering topical cream for the wart. -Patient encouraged to call the office with any questions, concerns, change in symptoms.   Trula Slade DPM

## 2021-10-27 ENCOUNTER — Telehealth: Payer: Self-pay | Admitting: *Deleted

## 2021-10-27 NOTE — Telephone Encounter (Signed)
Patient is calling to request that the prescription for wart cream (mentioned in LOV note) be sent to pharmacy. She has developed another wart within last few days which is making it difficult to walk. Please advise.

## 2021-10-28 ENCOUNTER — Other Ambulatory Visit: Payer: Self-pay | Admitting: Podiatry

## 2021-10-28 MED ORDER — FLUOROURACIL 5 % EX CREA: TOPICAL_CREAM | Freq: Two times a day (BID) | CUTANEOUS | 0 refills | Status: AC

## 2021-10-28 NOTE — Telephone Encounter (Signed)
Patient notified

## 2022-03-08 ENCOUNTER — Ambulatory Visit (INDEPENDENT_AMBULATORY_CARE_PROVIDER_SITE_OTHER): Payer: Managed Care, Other (non HMO) | Admitting: Podiatry

## 2022-03-08 DIAGNOSIS — B070 Plantar wart: Secondary | ICD-10-CM | POA: Diagnosis not present

## 2022-03-08 NOTE — Patient Instructions (Addendum)
Keep the bandage on for 24 hours. At that time, remove and clean with soap and water. If it hurts or burns before 24 hours go ahead and remove the bandage and wash with soap and water. Keep the area clean. If there is any blistering cover with antibiotic ointment and a bandage. Monitor for any redness, drainage, or other signs of infection. Call the office if any are to occur. If you have any questions, please call the office at [phone].   I have ordered a medication for you that will come from [facility] in [place]. They should be calling you to verify insurance and will mail the medication to you. If you live close by then you can go by their pharmacy to pick up the medication. Their phone number is [phone]. If you do not hear from them in the next few days, please give us a call at [phone].   

## 2022-03-08 NOTE — Progress Notes (Signed)
Subjective: Chief Complaint  Patient presents with   Plantar Warts    Right foot, heel and ball of foot     32 year old female presents the office today for follow-up evaluation of warts on the right foot.  She states that she has a new spot as well.  Area is tender as it is on her heel.  No swelling redness or any drainage.  Objective: AAO x3, NAD DP/PT pulses palpable bilaterally, CRT less than 3 seconds On the plantar aspect the right heel as well as submetatarsal area appears a punctate annular hyperkeratotic lesion with capillary budding consistent with verruca.  No edema, erythema or signs of infection.  The lesions in the left foot are resolving unable to visualize this today. No pain with calf compression, swelling, warmth, erythema  Assessment: Plantar wart right foot  Plan: -All treatment options discussed with the patient including all alternatives, risks, complications.  -Sharply debrided lesion with any complications.  Pinpoint bleeding noted.  Skin was cleaned with alcohol and pad was placed about salicylic acid followed by an occlusive bandage.  Postprocedure instructions discussed.  Monitor for any signs or symptoms of infection. -Efudex ream  -Patient encouraged to call the office with any questions, concerns, change in symptoms.   Trula Slade DPM

## 2022-04-19 ENCOUNTER — Ambulatory Visit (INDEPENDENT_AMBULATORY_CARE_PROVIDER_SITE_OTHER): Payer: Managed Care, Other (non HMO) | Admitting: Podiatry

## 2022-04-19 DIAGNOSIS — B070 Plantar wart: Secondary | ICD-10-CM | POA: Diagnosis not present

## 2022-04-19 NOTE — Patient Instructions (Signed)
Take dressing off in 8 hours and wash the foot with soap and water. If it is hurting or becomes uncomfortable before the 8 hours, go ahead and remove the bandage and wash the area.  If it blisters, apply antibiotic ointment and a band-aid.  Monitor for any signs/symptoms of infection. Call the office immediately if any occur or go directly to the emergency room. Call with any questions/concerns.   

## 2022-04-21 NOTE — Progress Notes (Signed)
Subjective: Chief Complaint  Patient presents with   Follow-up    Right foot, ball of foot     32 year old female presents the office today for follow-up evaluation of warts on the right foot.  She been using Efudex cream without any issues.  Objective: AAO x3, NAD DP/PT pulses palpable bilaterally, CRT less than 3 seconds On the plantar aspect the right heel as well as submetatarsal area appears a punctate annular hyperkeratotic lesion with capillary budding consistent with verruca.  No edema, erythema or signs of infection.   No pain with calf compression, swelling, warmth, erythema  Assessment: Plantar wart right foot  Plan: -All treatment options discussed with the patient including all alternatives, risks, complications.  -Improving.  Sharply debrided lesion with any complications.  Pinpoint bleeding noted.  Skin was cleaned with alcohol and Cantharone Plus was applied followed by an occlusive bandage.  Postprocedure instructions discussed.  Monitor for any signs or symptoms of infection. -Efudex ream  -Patient encouraged to call the office with any questions, concerns, change in symptoms.   Return if symptoms worsen or fail to improve.  Trula Slade DPM

## 2023-06-02 ENCOUNTER — Encounter: Payer: Self-pay | Admitting: Obstetrics and Gynecology

## 2023-06-02 ENCOUNTER — Other Ambulatory Visit (HOSPITAL_COMMUNITY)
Admission: RE | Admit: 2023-06-02 | Discharge: 2023-06-02 | Disposition: A | Discharge status: Home / Self Care | Source: Ambulatory Visit | Attending: Obstetrics and Gynecology | Admitting: Obstetrics and Gynecology

## 2023-06-02 ENCOUNTER — Ambulatory Visit: Admitting: Obstetrics and Gynecology

## 2023-06-02 VITALS — BP 112/82 | HR 69 | Ht 67.0 in | Wt 190.0 lb

## 2023-06-02 DIAGNOSIS — Z01419 Encounter for gynecological examination (general) (routine) without abnormal findings: Secondary | ICD-10-CM

## 2023-06-02 DIAGNOSIS — Z124 Encounter for screening for malignant neoplasm of cervix: Secondary | ICD-10-CM | POA: Diagnosis present

## 2023-06-02 DIAGNOSIS — N393 Stress incontinence (female) (male): Secondary | ICD-10-CM | POA: Diagnosis not present

## 2023-06-02 DIAGNOSIS — Z975 Presence of (intrauterine) contraceptive device: Secondary | ICD-10-CM

## 2023-06-02 MED ORDER — SOLIFENACIN SUCCINATE 5 MG PO TABS: 5.0000 mg | ORAL_TABLET | Freq: Every day | ORAL | 3 refills | Status: AC

## 2023-06-02 NOTE — Progress Notes (Signed)
 ANNUAL EXAM Patient name: Adelise Mcduffee MRN [id number]  Date of birth: 1990-06-23 Chief Complaint:   Gynecologic Exam (Pap 04-20-16 normal)  History of Present Illness:   Jade Palmer is a 33 y.o. [phone]  female being seen today for a routine annual exam.  Current complaints: Doing well with IUD in place, reports incontinence with activity, reports having to wear a pad, occurs daily. Was previously referred to pelvic pt, unable to do routine tx d/t schedule.   No LMP recorded. (Menstrual status: IUD). Liletta  2020   The pregnancy intention screening data noted above was reviewed. Potential methods of contraception were discussed. The patient elected to proceed with IUD or IUS.   Last pap 2018. Results were:  NILM . H/O abnormal pap: no Last mammogram: n/a. Results were: N/A. Family h/o breast cancer: no Last colonoscopy: n/a. Results were: N/A. Family h/o colorectal cancer: no     06/02/2023    8:38 AM 01/24/2018   10:41 AM 07/14/2017   10:02 AM  Depression screen PHQ 2/9  Decreased Interest 2 0 1  Down, Depressed, Hopeless 2 0 0  PHQ - 2 Score 4 0 1  Altered sleeping 2 0 0  Tired, decreased energy 3 3 1   Change in appetite 1 0 1  Feeling bad or failure about yourself  1 0 1  Trouble concentrating 3 0 0  Moving slowly or fidgety/restless 0 0 0  Suicidal thoughts 0 0 0  PHQ-9 Score 14 3 4         06/02/2023    8:39 AM  GAD 7 : Generalized Anxiety Score  Nervous, Anxious, on Edge 3  Control/stop worrying 3  Worry too much - different things 3  Trouble relaxing 3  Restless 1  Easily annoyed or irritable 3  Afraid - awful might happen 2  Total GAD 7 Score 18     Review of Systems:   Pertinent items are noted in HPI Denies any headaches, blurred vision, fatigue, shortness of breath, chest pain, abdominal pain, abnormal vaginal discharge/itching/odor/irritation, problems with periods, bowel movements, urination, or intercourse unless otherwise stated  above. Pertinent History Reviewed:  Reviewed past medical,surgical, social and family history.  Reviewed problem list, medications and allergies. Physical Assessment:   Vitals:   06/02/23 0832  BP: 112/82  Pulse: 69  Weight: 190 lb (86.2 kg)  Height: 5\' 7"  (1.702 m)  Body mass index is 29.76 kg/m.        Physical Examination:   General appearance - well appearing, and in no distress  Mental status - alert, oriented   Psych:  She has a normal mood and affect  Skin - warm and dry, normal color  Chest - effort normal, all lung fields clear to auscultation bilaterally  Heart - normal rate and regular rhythm  Neck:  midline trachea, no thyromegaly or nodules  Breasts - breasts appear normal, no suspicious masses, no skin or nipple changes or  axillary nodes  Abdomen - soft, nontender, nondistended, no masses or organomegaly  Pelvic - VULVA: normal appearing vulva with no masses, tenderness or lesions  VAGINA: normal appearing vagina with normal color and discharge, no lesions  CERVIX: normal appearing cervix without discharge or lesions, no CMT IUD strings visualized   Thin prep pap is done w HR HPV cotesting  UTERUS: uterus is felt to be normal size, shape, consistency and nontender   ADNEXA: No adnexal masses or tenderness noted.  Extremities:  No swelling or varicosities noted  Chaperone present for exam  No results found for this or any previous visit (from the past 24 hours).  Assessment & Plan:  1. Encounter for annual routine gynecological examination (Primary) Pap collected today Mammo @ 39 Encouraged self breast exams - Cytology - PAP( [place])  2. Cervical cancer screening  - Cytology - PAP( [place])  3. IUD (intrauterine device) in place Litletta placed 2020, to be removed/replaced 2028 unless desires removal sooner   4. Stress incontinence Discussed trial vesicare, unable to do pelvic PT due to schedule and family life. Also discussed trial slim tampon  with increased activity. Desires to follow up if not helping  - solifenacin (VESICARE) 5 MG tablet; Take 1 tablet (5 mg total) by mouth daily.  Dispense: 30 tablet; Refill: 3   Labs/procedures today:   Mammogram: @ 34yo, or sooner if problems Colonoscopy: @ 34yo, or sooner if problems  No orders of the defined types were placed in this encounter.   Meds:  Meds ordered this encounter  Medications   solifenacin (VESICARE) 5 MG tablet    Sig: Take 1 tablet (5 mg total) by mouth daily.    Dispense:  30 tablet    Refill:  3    Follow-up: Return in about 1 year (around 06/01/2024) for ANNUAL EXAM.Follow up PRN   Susi Eric, FNP

## 2023-06-07 LAB — CYTOLOGY - PAP
Comment: NEGATIVE
Diagnosis: NEGATIVE
High risk HPV: NEGATIVE

## 2023-06-30 ENCOUNTER — Other Ambulatory Visit: Payer: Self-pay

## 2023-06-30 ENCOUNTER — Emergency Department (HOSPITAL_COMMUNITY)
Admission: EM | Admit: 2023-06-30 | Discharge: 2023-07-01 | Disposition: A | Discharge status: Home / Self Care | Attending: Emergency Medicine | Admitting: Emergency Medicine

## 2023-06-30 ENCOUNTER — Encounter (HOSPITAL_COMMUNITY): Payer: Self-pay

## 2023-06-30 DIAGNOSIS — N132 Hydronephrosis with renal and ureteral calculous obstruction: Secondary | ICD-10-CM | POA: Diagnosis not present

## 2023-06-30 DIAGNOSIS — R739 Hyperglycemia, unspecified: Secondary | ICD-10-CM | POA: Diagnosis not present

## 2023-06-30 DIAGNOSIS — N201 Calculus of ureter: Secondary | ICD-10-CM

## 2023-06-30 DIAGNOSIS — E876 Hypokalemia: Secondary | ICD-10-CM | POA: Diagnosis not present

## 2023-06-30 DIAGNOSIS — M549 Dorsalgia, unspecified: Secondary | ICD-10-CM | POA: Diagnosis present

## 2023-06-30 LAB — URINALYSIS, ROUTINE W REFLEX MICROSCOPIC
Bilirubin Urine: NEGATIVE
Glucose, UA: NEGATIVE mg/dL
Ketones, ur: 5 mg/dL — AB
Nitrite: NEGATIVE
Protein, ur: 30 mg/dL — AB
RBC / HPF: >50 RBC/hpf (ref 0–5)
Specific Gravity, Urine: 1.021 (ref 1.005–1.030)
WBC, UA: >50 WBC/hpf (ref 0–5)
pH: 5 (ref 5.0–8.0)

## 2023-06-30 LAB — LIPASE, BLOOD: Lipase: 31 U/L (ref 11–51)

## 2023-06-30 LAB — CBC
HCT: 38.1 % (ref 36.0–46.0)
Hemoglobin: 12.6 g/dL (ref 12.0–15.0)
MCH: 28.3 pg (ref 26.0–34.0)
MCHC: 33.1 g/dL (ref 30.0–36.0)
MCV: 85.4 fL (ref 80.0–100.0)
Platelets: 260 10*3/uL (ref 150–400)
RBC: 4.46 MIL/uL (ref 3.87–5.11)
RDW: 13.1 % (ref 11.5–15.5)
WBC: 10.6 10*3/uL — ABNORMAL HIGH (ref 4.0–10.5)
nRBC: 0 % (ref 0.0–0.2)

## 2023-06-30 LAB — COMPREHENSIVE METABOLIC PANEL WITH GFR
ALT: 16 U/L (ref 0–44)
AST: 15 U/L (ref 15–41)
Albumin: 4.4 g/dL (ref 3.5–5.0)
Alkaline Phosphatase: 45 U/L (ref 38–126)
Anion gap: 8 (ref 5–15)
BUN: 12 mg/dL (ref 6–20)
CO2: 23 mmol/L (ref 22–32)
Calcium: 9.2 mg/dL (ref 8.9–10.3)
Chloride: 107 mmol/L (ref 98–111)
Creatinine, Ser: 0.87 mg/dL (ref 0.44–1.00)
GFR, Estimated: >60 mL/min (ref >60)
Glucose, Bld: 113 mg/dL — ABNORMAL HIGH (ref 70–99)
Potassium: 3.3 mmol/L — ABNORMAL LOW (ref 3.5–5.1)
Sodium: 138 mmol/L (ref 135–145)
Total Bilirubin: 1 mg/dL (ref 0.0–1.2)
Total Protein: 7.6 g/dL (ref 6.5–8.1)

## 2023-06-30 NOTE — ED Provider Notes (Addendum)
 [facility] EMERGENCY DEPARTMENT AT [facility] Provider Note   CSN: [id number] Arrival date & time: 06/30/23  2233     History  Chief Complaint  Patient presents with   Flank Pain    Jade Palmer is a 33 y.o. female.  The history is provided by the patient.  Flank Pain  She has history of kidney stones and comes in with onset today of pain across her mid back with some radiation to the lower abdomen.  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  She had a massage today and her pain got significantly worse.  There was associated nausea and vomiting as well as some chills and sweats.  She denies fever.  Pain does radiate to the lower abdomen.  She denies urinary urgency or frequency or tenesmus or dysuria but did notice some blood in her urine.  She has not taken anything for pain.  Of note, she has an IUD for contraception.   Home Medications Prior to Admission medications   Medication Sig Start Date End Date Taking? Authorizing Provider  ondansetron  (ZOFRAN ) 4 MG tablet Take 1 tablet (4 mg total) by mouth every 8 (eight) hours as needed for nausea or vomiting. 07/01/23  Yes Alissa April, MD  ondansetron  (ZOFRAN -ODT) 4 MG disintegrating tablet Take 1 tablet (4 mg total) by mouth every 8 (eight) hours as needed for nausea or vomiting. 07/01/23  Yes Alissa April, MD  oxyCODONE  (ROXICODONE ) 5 MG immediate release tablet Take 1 tablet (5 mg total) by mouth every 4 (four) hours as needed for severe pain (pain score 7-10). 07/01/23  Yes Alissa April, MD  oxyCODONE -acetaminophen  (PERCOCET) 5-325 MG tablet Take 1 tablet by mouth every 4 (four) hours as needed for moderate pain (pain score 4-6). 07/01/23  Yes Alissa April, MD  potassium chloride SA (KLOR-CON M) 20 MEQ tablet Take 1 tablet (20 mEq total) by mouth 2 (two) times daily. 07/01/23  Yes Alissa April, MD  tamsulosin  (FLOMAX ) 0.4 MG CAPS capsule Take 1  capsule (0.4 mg total) by mouth daily. 07/01/23  Yes Alissa April, MD  fluorouracil  (EFUDEX ) 5 % cream Apply topically 2 (two) times daily. Patient not taking: Reported on 06/02/2023 10/28/21   Velma Ghazi, DPM  levonorgestrel  (LILETTA ) 19.5 MCG/DAY IUD IUD 1 each by Intrauterine route once.    [provider]  solifenacin  (VESICARE ) 5 MG tablet Take 1 tablet (5 mg total) by mouth daily. 06/02/23   Zelma Hidden, FNP      Allergies    Patient has no known allergies.    Review of Systems   Review of Systems  Genitourinary:  Positive for flank pain.  All other systems reviewed and are negative.   Physical Exam Updated Vital Signs BP (!) 159/111   Pulse 66   Temp 98 F (36.7 C) (Temporal)   Resp 18   Ht 5\' 7"  (1.702 m)   Wt 81.6 kg   SpO2 100%   BMI 28.19 kg/m  Physical Exam Vitals and nursing note reviewed.   33 year old female, resting comfortably and in no acute distress. Vital signs are significant for elevated blood pressure. Oxygen saturation is 100%, which is normal. Head is normocephalic and atraumatic. PERRLA, EOMI.  Back is nontender and there is no CVA tenderness. Lungs are clear without rales, wheezes, or rhonchi. Chest is nontender. Heart has regular rate and rhythm without murmur.  Abdomen is soft, flat, nontender. Skin is warm and dry without rash. Neurologic: Mental status is normal, moves all extremities equally.  ED Results / Procedures / Treatments   Labs (all labs ordered are listed, but only abnormal results are displayed) Labs Reviewed  COMPREHENSIVE METABOLIC PANEL WITH GFR - Abnormal; Notable for the following components:      Result Value   Potassium 3.3 (*)    Glucose, Bld 113 (*)    All other components within normal limits  CBC - Abnormal; Notable for the following components:   WBC 10.6 (*)    All other components within normal limits  URINALYSIS, ROUTINE W REFLEX MICROSCOPIC - Abnormal; Notable for the following components:    APPearance HAZY (*)    Hgb urine dipstick MODERATE (*)    Ketones, ur 5 (*)    Protein, ur 30 (*)    Leukocytes,Ua SMALL (*)    Bacteria, UA RARE (*)    All other components within normal limits  LIPASE, BLOOD  PREGNANCY, URINE    EKG None  Radiology CT Renal Stone Study Result Date: 07/01/2023 CLINICAL DATA:  Abdominal/flank pain, stone suspected Pt stated that is hurting in her left flank area that started tonight around 1930. Pt stated that she has had some blood in her urine and has not had a BM since two days ago. EXAM: CT ABDOMEN AND PELVIS WITHOUT CONTRAST TECHNIQUE: Multidetector CT imaging of the abdomen and pelvis was performed following the standard protocol without IV contrast. RADIATION DOSE REDUCTION: This exam was performed according to the departmental dose-optimization program which includes automated exposure control, adjustment of the mA and/or kV according to patient size and/or use of iterative reconstruction technique. COMPARISON:  CT abdomen pelvis 03/02/2010 FINDINGS: Lower chest: No acute abnormality. Hepatobiliary: No focal liver abnormality. No gallstones, gallbladder wall thickening, or pericholecystic fluid. No biliary dilatation. Pancreas: No focal lesion. Normal pancreatic contour. No surrounding inflammatory changes. No main pancreatic ductal dilatation. Spleen: Normal in size without focal abnormality. Adrenals/Urinary Tract: No adrenal nodule bilaterally. Punctate bilateral nephrolithiasis. Punctate left ureteropelvic junction calcified stone with fullness of the left renal pelvis. No right ureterolithiasis. The urinary bladder is unremarkable. Stomach/Bowel: Stomach is within normal limits. Mobile colon within the left abdomen. The majority of the small bowel is within the right abdomen. No evidence of bowel wall thickening or dilatation. Appendix appears normal. Vascular/Lymphatic: No abdominal aorta or iliac aneurysm. No abdominal, pelvic, or inguinal  lymphadenopathy. Reproductive: T shaped intrauterine device in appropriate position. Otherwise uterus and bilateral adnexa are unremarkable. Other: No intraperitoneal free fluid. No intraperitoneal free gas. No organized fluid collection. Musculoskeletal: No abdominal wall hernia or abnormality. No suspicious lytic or blastic osseous lesions. No acute displaced fracture. IMPRESSION: 1. Obstructive punctate left ureteropelvic junction stone. 2. Nonobstructive punctate bilateral nephrolithiasis. 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 4. T shaped intrauterine device in appropriate position. Electronically Signed   By: Morgane  Naveau M.D.   On: 07/01/2023 01:17    Procedures Procedures    Medications Ordered in ED Medications  potassium chloride SA (KLOR-CON M) CR tablet 40 mEq (has no administration in time range)  HYDROmorphone  (DILAUDID ) injection 2 mg (has no administration in time range)  ondansetron  (ZOFRAN -ODT) disintegrating tablet 8 mg (8 mg Oral Given 07/01/23 0041)  ibuprofen  (ADVIL ) tablet 600 mg (600 mg Oral Given 07/01/23 0042)    ED Course/ Medical Decision Making/ A&P  Medical Decision Making Amount and/or Complexity of Data Reviewed Labs: ordered. Radiology: ordered.  Risk Prescription drug management.   Back pain.  Differential diagnosis includes, but is not limited to, urolithiasis with renal colic, musculoskeletal back pain.  Doubt pyelonephritis.  I have reviewed her laboratory tests, and my interpretation is mild hypokalemia, elevated random glucose level, normal lipase, borderline leukocytosis which is nonspecific, urinalysis with greater than 50 RBCs and greater than 50 WBCs but with a contaminated specimen with 11-20 squamous epithelial cells.  I have ordered ondansetron  oral dissolving tablet, ibuprofen  for pain and I have ordered renal stone protocol CT scan.  I have reviewed her past records, and note cystoscopy for removal of  left ureteral calculus on 04/12/2017.  CT scan shows punctate ureteral calculus at the left ureteropelvic junction with hydronephrosis, also punctate renal calculi bilaterally.  Have independently viewed the images, and agree with the radiologist's interpretation.  Patient is complaining of increased pain, I have ordered a dose of oxycodone -acetaminophen  and also a dose of oral potassium.  I am discharging her with prescriptions for tamsulosin , ondansetron  oral dissolving tablet, oxycodone .  I am also giving her take-home packs of ondansetron  and oxycodone -acetaminophen .  I am referring her to urology for follow-up.   Final Clinical Impression(s) / ED Diagnoses Final diagnoses:  Ureterolithiasis  Hypokalemia  Elevated random blood glucose level    Rx / DC Orders ED Discharge Orders          Ordered    potassium chloride SA (KLOR-CON M) 20 MEQ tablet  2 times daily        07/01/23 0128    tamsulosin  (FLOMAX ) 0.4 MG CAPS capsule  Daily        07/01/23 0128    ondansetron  (ZOFRAN -ODT) 4 MG disintegrating tablet  Every 8 hours PRN        07/01/23 0128    ondansetron  (ZOFRAN ) 4 MG tablet  Every 8 hours PRN        07/01/23 0128    oxyCODONE -acetaminophen  (PERCOCET) 5-325 MG tablet  Every 4 hours PRN        07/01/23 0128    oxyCODONE  (ROXICODONE ) 5 MG immediate release tablet  Every 4 hours PRN        07/01/23 0128              Alissa April, MD 07/01/23 0133   Patient requested a dose of hydromorphone  rather than oxycodone  prior to discharge.  I have ordered a dose of hydromorphone  intramuscularly.   Alissa April, MD 07/01/23 [phone]

## 2023-06-30 NOTE — ED Triage Notes (Signed)
 Pt stated that is hurting in her left flank area that started tonight around 1930. Pt stated that she has had some blood in her urine and has not had a BM since two days ago.

## 2023-07-01 ENCOUNTER — Emergency Department (HOSPITAL_COMMUNITY)

## 2023-07-01 LAB — PREGNANCY, URINE: Preg Test, Ur: NEGATIVE

## 2023-07-01 MED ORDER — POTASSIUM CHLORIDE CRYS ER 20 MEQ PO TBCR: 20.0000 meq | EXTENDED_RELEASE_TABLET | Freq: Two times a day (BID) | ORAL | 0 refills | Status: AC

## 2023-07-01 MED ORDER — ONDANSETRON HCL 4 MG PO TABS: 4.0000 mg | ORAL_TABLET | Freq: Three times a day (TID) | ORAL | 0 refills | Status: AC | PRN

## 2023-07-01 MED ORDER — OXYCODONE-ACETAMINOPHEN 5-325 MG PO TABS: 1.0000 | ORAL_TABLET | ORAL | 0 refills | Status: AC | PRN

## 2023-07-01 MED ORDER — HYDROMORPHONE HCL 1 MG/ML IJ SOLN
2.0000 mg | Freq: Once | INTRAMUSCULAR | Status: AC
  Administered 2023-07-01: 2 mg via INTRAMUSCULAR
  Filled 2023-07-01: qty 2

## 2023-07-01 MED ORDER — ONDANSETRON 8 MG PO TBDP
8.0000 mg | ORAL_TABLET | Freq: Once | ORAL | Status: AC
  Administered 2023-07-01: 8 mg via ORAL
  Filled 2023-07-01: qty 1

## 2023-07-01 MED ORDER — OXYCODONE HCL 5 MG PO TABS: 5.0000 mg | ORAL_TABLET | ORAL | 0 refills | Status: AC | PRN

## 2023-07-01 MED ORDER — POTASSIUM CHLORIDE CRYS ER 20 MEQ PO TBCR
40.0000 meq | EXTENDED_RELEASE_TABLET | Freq: Once | ORAL | Status: AC
  Administered 2023-07-01: 40 meq via ORAL
  Filled 2023-07-01: qty 2

## 2023-07-01 MED ORDER — IBUPROFEN 400 MG PO TABS
600.0000 mg | ORAL_TABLET | Freq: Once | ORAL | Status: AC
  Administered 2023-07-01: 600 mg via ORAL
  Filled 2023-07-01: qty 2

## 2023-07-01 MED ORDER — ONDANSETRON 4 MG PO TBDP: 4.0000 mg | ORAL_TABLET | Freq: Three times a day (TID) | ORAL | 0 refills | Status: AC | PRN

## 2023-07-01 MED ORDER — OXYCODONE-ACETAMINOPHEN 5-325 MG PO TABS: 1.0000 | ORAL_TABLET | Freq: Once | ORAL | Status: DC

## 2023-07-01 MED ORDER — TAMSULOSIN HCL 0.4 MG PO CAPS: 0.4000 mg | ORAL_CAPSULE | Freq: Every day | ORAL | 0 refills | Status: AC

## 2023-07-01 NOTE — Discharge Instructions (Addendum)
 Drink plenty of fluids.  You may apply ice to the sore areas.  Ice should be applied for 30 minutes at a time, 4 times a day.  You may take ibuprofen  and/or acetaminophen  as needed for less severe pain.  When you combine ibuprofen  and acetaminophen , you will get better pain relief and you get from taking either medication by itself.  Add oxycodone  if you are still not getting adequate relief.  Oxycodone  works on pain in a completely different way and its effects added to ibuprofen  and acetaminophen .  Return to the emergency department if pain is not being adequately controlled, your vomiting in spite of the nausea medicine, or if you start running a fever.

## 2023-07-05 MED FILL — Oxycodone w/ Acetaminophen Tab 5-325 MG: ORAL | Qty: 6 | Status: AC

## 2023-07-05 MED FILL — Ondansetron HCl Tab 4 MG: ORAL | Qty: 4 | Status: AC
